# Patient Record
Sex: Male | Born: 1935
Health system: Southern US, Community
[De-identification: ages and names within clinical notes are randomized; demographics above are authoritative.]

## PROBLEM LIST (undated history)

## (undated) ENCOUNTER — Emergency Department (HOSPITAL_COMMUNITY): Payer: Medicare Other

## (undated) ENCOUNTER — Emergency Department (HOSPITAL_COMMUNITY): Discharge status: Absent without leave | Payer: Medicare Other

## (undated) DIAGNOSIS — E785 Hyperlipidemia, unspecified: Secondary | ICD-10-CM

## (undated) DIAGNOSIS — M1990 Unspecified osteoarthritis, unspecified site: Secondary | ICD-10-CM

## (undated) DIAGNOSIS — C4490 Unspecified malignant neoplasm of skin, unspecified: Secondary | ICD-10-CM

## (undated) DIAGNOSIS — I10 Essential (primary) hypertension: Secondary | ICD-10-CM

## (undated) HISTORY — DX: Hyperlipidemia, unspecified: E78.5

## (undated) HISTORY — DX: Essential (primary) hypertension: I10

## (undated) HISTORY — PX: SKIN LESION EXCISION: SHX2412

## (undated) HISTORY — PX: JOINT REPLACEMENT: SHX530

## (undated) HISTORY — PX: APPENDECTOMY: SHX54

---

## 2004-12-27 ENCOUNTER — Inpatient Hospital Stay (HOSPITAL_COMMUNITY): Admission: RE | Admit: 2004-12-27 | Discharge: 2005-01-01 | Payer: Self-pay | Admitting: Orthopedic Surgery

## 2004-12-27 ENCOUNTER — Ambulatory Visit: Payer: Self-pay | Admitting: Physical Medicine & Rehabilitation

## 2005-01-01 ENCOUNTER — Inpatient Hospital Stay
Admission: RE | Admit: 2005-01-01 | Discharge: 2005-01-05 | Payer: Self-pay | Admitting: Physical Medicine & Rehabilitation

## 2010-05-10 ENCOUNTER — Encounter: Payer: Self-pay | Admitting: Nurse Practitioner

## 2010-05-10 DIAGNOSIS — M1990 Unspecified osteoarthritis, unspecified site: Secondary | ICD-10-CM

## 2010-05-10 DIAGNOSIS — E559 Vitamin D deficiency, unspecified: Secondary | ICD-10-CM | POA: Insufficient documentation

## 2010-05-10 DIAGNOSIS — I10 Essential (primary) hypertension: Secondary | ICD-10-CM | POA: Insufficient documentation

## 2012-04-22 ENCOUNTER — Other Ambulatory Visit: Payer: Self-pay | Admitting: *Deleted

## 2012-04-22 DIAGNOSIS — I10 Essential (primary) hypertension: Secondary | ICD-10-CM

## 2012-04-22 MED ORDER — LISINOPRIL-HYDROCHLOROTHIAZIDE 20-12.5 MG PO TABS: 2.0000 | ORAL_TABLET | Freq: Every day | ORAL | Status: DC

## 2012-05-14 ENCOUNTER — Encounter: Payer: Self-pay | Admitting: Nurse Practitioner

## 2012-05-14 ENCOUNTER — Ambulatory Visit (INDEPENDENT_AMBULATORY_CARE_PROVIDER_SITE_OTHER): Payer: BC Managed Care – HMO | Admitting: Nurse Practitioner

## 2012-05-14 VITALS — BP 137/83 | HR 77 | Temp 97.0°F | Ht 74.0 in | Wt 266.0 lb

## 2012-05-14 DIAGNOSIS — I878 Other specified disorders of veins: Secondary | ICD-10-CM

## 2012-05-14 DIAGNOSIS — I10 Essential (primary) hypertension: Secondary | ICD-10-CM

## 2012-05-14 DIAGNOSIS — I872 Venous insufficiency (chronic) (peripheral): Secondary | ICD-10-CM

## 2012-05-14 DIAGNOSIS — L02419 Cutaneous abscess of limb, unspecified: Secondary | ICD-10-CM

## 2012-05-14 DIAGNOSIS — L03119 Cellulitis of unspecified part of limb: Secondary | ICD-10-CM

## 2012-05-14 MED ORDER — CEPHALEXIN 500 MG PO CAPS
500.0000 mg | ORAL_CAPSULE | Freq: Three times a day (TID) | ORAL | Status: DC
Start: 2012-05-14 — End: 2012-06-10

## 2012-05-14 MED ORDER — LISINOPRIL 40 MG PO TABS: 40.0000 mg | ORAL_TABLET | Freq: Every day | ORAL | Status: DC

## 2012-05-14 MED ORDER — FUROSEMIDE 20 MG PO TABS: 20.0000 mg | ORAL_TABLET | Freq: Every day | ORAL | Status: DC

## 2012-05-14 NOTE — Patient Instructions (Addendum)
Venous Stasis and Chronic Venous Insufficiency As people age, the veins located in their legs may weaken and stretch. When veins weaken and lose the ability to pump blood effectively, the condition is called chronic venous insufficiency (CVI) or venous stasis. Almost all veins return blood back to the heart. This happens by:  The force of the heart pumping fresh blood pushes blood back to the heart.  Blood flowing to the heart from the force of gravity. In the deep veins of the legs, blood has to fight gravity and flow upstream back to the heart. Here, the leg muscles contract to pump blood back toward the heart. Vein walls are elastic, and many veins have small valves that only allow blood to flow in one direction. When leg muscles contract, they push inward against the elastic vein walls. This squeezes blood upward, opens the valves, and moves blood toward the heart. When leg muscles relax, the vein wall also relaxes and the valves inside the vein close to prevent blood from flowing backward. This method of pumping blood out of the legs is called the venous pump. CAUSES  The venous pump works best while walking and leg muscles are contracting. But when a person sits or stands, blood pressure in leg veins can build. Deep veins are usually able to withstand short periods of inactivity, but long periods of inactivity (and increased pressure) can stretch, weaken, and damage vein walls. High blood pressure can also stretch and damage vein walls. The veins may no longer be able to pump blood back to the heart. Venous hypertension (high blood pressure inside veins) that lasts over time is a primary cause of CVI. CVI can also be caused by:   Deep vein thrombosis, a condition where a thrombus (blood clot) blocks blood flow in a vein.  Phlebitis, an inflammation of a superficial vein that causes a blood clot to form. Other risk factors for CVI may include:   Heredity.  Obesity.  Pregnancy.  Sedentary  lifestyle.  Smoking.  Jobs requiring long periods of standing or sitting in one place.  Age and gender:  Women in their 40's and 50's and men in their 70's are more prone to developing CVI. SYMPTOMS  Symptoms of CVI may include:   Varicose veins.  Ulceration or skin breakdown.  Lipodermatosclerosis, a condition that affects the skin just above the ankle, usually on the inside surface. Over time the skin becomes brown, smooth, tight and often painful. Those with this condition have a high risk of developing skin ulcers.  Reddened or discolored skin on the leg.  Swelling. DIAGNOSIS  Your caregiver can diagnose CVI after performing a careful medical history and physical examination. To confirm the diagnosis, the following tests may also be ordered:   Duplex ultrasound.  Plethysmography (tests blood flow).  Venograms (x-ray using a special dye). TREATMENT The goals of treatment for CVI are to restore a person to an active life and to minimize pain or disability. Typically, CVI does not pose a serious threat to life or limb, and with proper treatment most people with this condition can continue to lead active lives. In most cases, mild CVI can be treated on an outpatient basis with simple procedures. Treatment methods include:   Elastic compression socks.  Sclerotherapy, a procedure involving an injection of a material that "dissolves" the damaged veins. Other veins in the network of blood vessels take over the function of the damaged veins.  Vein stripping (an older procedure less commonly used).    Laser Ablation surgery.  Valve repair. HOME CARE INSTRUCTIONS   Elastic compression socks must be worn every day. They can help with symptoms and lower the chances of the problem getting worse, but they do not cure the problem.  Only take over-the-counter or prescription medicines for pain, discomfort, or fever as directed by your caregiver.  Your caregiver will review your other  medications with you. SEEK MEDICAL CARE IF:   You are confused about how to take your medications.  There is redness, swelling, or increasing pain in the affected area.  There is a red streak or line that extends up or down from the affected area.  There is a breakdown or loss of skin in the affected area, even if the breakdown is small.  You develop an unexplained oral temperature above 102 F (38.9 C).  There is an injury to the affected area. SEEK IMMEDIATE MEDICAL CARE IF:   There is an injury and open wound to the affected area.  Pain is not adequately relieved with pain medication prescribed or becomes severe.  An oral temperature above 102 F (38.9 C) develops.  The foot/ankle below the affected area becomes suddenly numb or the area feels weak and hard to move. MAKE SURE YOU:   Understand these instructions.  Will watch your condition.  Will get help right away if you are not doing well or get worse. Document Released: 05/28/2006 Document Revised: 04/16/2011 Document Reviewed: 08/05/2006 [employer] Patient Information 2013 [employer].  

## 2012-05-14 NOTE — Progress Notes (Signed)
  Subjective:    Patient ID: [name], male    DOB: [birth year], 79 y.o.   MRN: [id number]  HPI Patient in complaining of rash on bilateral lower extremtries . Started about 2  years. Has gotten better and worse since started. Pt states it flares up every summer with hot weather.  Associated symptoms include dry, cracked skin. He has tried Keflex last year with relief     Review of Systems  Constitutional: Negative.   HENT: Negative.   Cardiovascular: Negative.   Gastrointestinal: Negative.   Genitourinary: Negative.   Skin: Positive for rash (dry, red, and cracked).  Neurological: Negative.   Psychiatric/Behavioral: Negative.        Objective:   Physical Exam  Constitutional: He is oriented to person, place, and time. He appears well-developed.  HENT:  Head: Normocephalic.  Cardiovascular: Normal rate, regular rhythm and normal heart sounds.  Exam reveals no gallop.   No murmur heard. Pulmonary/Chest: Effort normal and breath sounds normal. No respiratory distress. He exhibits no tenderness.  Abdominal: Soft. Bowel sounds are normal. There is no tenderness. There is no rebound.  Musculoskeletal: Normal range of motion. He exhibits no edema.  Neurological: He is alert and oriented to person, place, and time.  Skin: Skin is dry. Rash (dry, red, cracked on lower extremites) noted.   BP 137/83  Pulse 77  Temp(Src) 97 F (36.1 C) (Oral)  Ht 6\' 2"  (1.88 m)  Wt 266 lb (120.657 kg)  BMI 34.14 kg/m2        Assessment & Plan:  Venous stasis with cellulitis bil Lower Ext  Elevate when sitting  STOP ZESTORETIC  Lisinopril 40 mg 1 QD  Lasix 20 mg 1 PO QD  Keflex as RX  RTO prn Mary-Margaret Daphine Deutscher, FNP

## 2012-06-10 ENCOUNTER — Ambulatory Visit (INDEPENDENT_AMBULATORY_CARE_PROVIDER_SITE_OTHER): Payer: BC Managed Care – HMO | Admitting: Nurse Practitioner

## 2012-06-10 ENCOUNTER — Encounter: Payer: Self-pay | Admitting: Nurse Practitioner

## 2012-06-10 VITALS — BP 169/82 | HR 54 | Temp 98.0°F | Ht 74.0 in | Wt 269.0 lb

## 2012-06-10 DIAGNOSIS — I10 Essential (primary) hypertension: Secondary | ICD-10-CM

## 2012-06-10 DIAGNOSIS — R600 Localized edema: Secondary | ICD-10-CM | POA: Insufficient documentation

## 2012-06-10 DIAGNOSIS — R609 Edema, unspecified: Secondary | ICD-10-CM

## 2012-06-10 LAB — COMPLETE METABOLIC PANEL WITH GFR
ALT: 14 U/L (ref 0–53)
AST: 17 U/L (ref 0–37)
Albumin: 4.1 g/dL (ref 3.5–5.2)
Alkaline Phosphatase: 50 U/L (ref 39–117)
BUN: 15 mg/dL (ref 6–23)
CO2: 29 mEq/L (ref 19–32)
Calcium: 9.5 mg/dL (ref 8.4–10.5)
Chloride: 102 mEq/L (ref 96–112)
Creat: 1.07 mg/dL (ref 0.50–1.35)
GFR, Est African American: 75 mL/min
GFR, Est Non African American: 65 mL/min
Glucose, Bld: 78 mg/dL (ref 70–99)
Potassium: 4.9 mEq/L (ref 3.5–5.3)
Sodium: 137 mEq/L (ref 135–145)
Total Bilirubin: 0.7 mg/dL (ref 0.3–1.2)
Total Protein: 6.2 g/dL (ref 6.0–8.3)

## 2012-06-10 MED ORDER — LISINOPRIL 40 MG PO TABS: 40.0000 mg | ORAL_TABLET | Freq: Every day | ORAL | Status: DC

## 2012-06-10 NOTE — Progress Notes (Signed)
  Subjective:    Patient ID: [name], male    DOB: Mar 01, 1929, 79 y.o.   MRN: [id number]  Hypertension This is a chronic problem. The current episode started more than 1 year ago. The problem has been waxing and waning since onset. The problem is uncontrolled. Associated symptoms include peripheral edema. Pertinent negatives include no blurred vision, chest pain, headaches, malaise/fatigue, palpitations, shortness of breath or sweats. There are no associated agents to hypertension. Risk factors for coronary artery disease include obesity and male gender. Past treatments include diuretics and ACE inhibitors. The current treatment provides mild improvement.  Peripheral edema Patient was recently put on lasix. Also on HCTZ with his lisinopril. Patient says swelling has gotten better since starting lasix    Review of Systems  Constitutional: Negative for malaise/fatigue.  Eyes: Negative for blurred vision.  Respiratory: Negative for shortness of breath.   Cardiovascular: Negative for chest pain and palpitations.  Neurological: Negative for headaches.  All other systems reviewed and are negative.       Objective:   Physical Exam  Constitutional: He is oriented to person, place, and time. He appears well-developed and well-nourished.  HENT:  Head: Normocephalic.  Right Ear: External ear normal.  Left Ear: External ear normal.  Nose: Nose normal.  Mouth/Throat: Oropharynx is clear and moist.  Eyes: EOM are normal. Pupils are equal, round, and reactive to light.  Neck: Normal range of motion. Neck supple. No thyromegaly present.  Cardiovascular: Normal rate, regular rhythm, normal heart sounds and intact distal pulses.   No murmur heard. Pulmonary/Chest: Effort normal and breath sounds normal. He has no wheezes. He has no rales.  Abdominal: Soft. Bowel sounds are normal.  Genitourinary: Prostate normal and penis normal.  Musculoskeletal: Normal range of motion.  Erythematous  lichenfication bil lower ext.  Neurological: He is alert and oriented to person, place, and time.  Skin: Skin is warm and dry.  Psychiatric: He has a normal mood and affect. His behavior is normal. Judgment and thought content normal.   BP 169/82  Pulse 54  Temp(Src) 98 F (36.7 C) (Oral)  Ht 6\' 2"  (1.88 m)  Wt 269 lb (122.018 kg)  BMI 34.52 kg/m2        Assessment & Plan:  1. Peripheral edema _Continue lasix as rx -elevate legs when sitting  2. Hypertension Low NA+ diet Stop zestoretic - lisinopril (PRINIVIL,ZESTRIL) 40 MG tablet; Take 1 tablet (40 mg total) by mouth daily.  Dispense: 90 tablet; Refill: 3  Orders Placed This Encounter  Procedures  . COMPLETE METABOLIC PANEL WITH GFR  . NMR Lipoprofile with Lipids  Mary-Margaret Daphine Deutscher, FNP

## 2012-06-10 NOTE — Patient Instructions (Signed)
Health Maintenance, Males A healthy lifestyle and preventative care can promote health and wellness.  Maintain regular health, dental, and eye exams.  Eat a healthy diet. Foods like vegetables, fruits, whole grains, low-fat dairy products, and lean protein foods contain the nutrients you need without too many calories. Decrease your intake of foods high in solid fats, added sugars, and salt. Get information about a proper diet from your caregiver, if necessary.  Regular physical exercise is one of the most important things you can do for your health. Most adults should get at least 150 minutes of moderate-intensity exercise (any activity that increases your heart rate and causes you to sweat) each week. In addition, most adults need muscle-strengthening exercises on 2 or more days a week.   Maintain a healthy weight. The body mass index (BMI) is a screening tool to identify possible weight problems. It provides an estimate of body fat based on height and weight. Your caregiver can help determine your BMI, and can help you achieve or maintain a healthy weight. For adults 20 years and older:  A BMI below 18.5 is considered underweight.  A BMI of 18.5 to 24.9 is normal.  A BMI of 25 to 29.9 is considered overweight.  A BMI of 30 and above is considered obese.  Maintain normal blood lipids and cholesterol by exercising and minimizing your intake of saturated fat. Eat a balanced diet with plenty of fruits and vegetables. Blood tests for lipids and cholesterol should begin at age 20 and be repeated every 5 years. If your lipid or cholesterol levels are high, you are over 50, or you are a high risk for heart disease, you may need your cholesterol levels checked more frequently.Ongoing high lipid and cholesterol levels should be treated with medicines, if diet and exercise are not effective.  If you smoke, find out from your caregiver how to quit. If you do not use tobacco, do not start.  If you  choose to drink alcohol, do not exceed 2 drinks per day. One drink is considered to be 12 ounces (355 mL) of beer, 5 ounces (148 mL) of wine, or 1.5 ounces (44 mL) of liquor.  Avoid use of street drugs. Do not share needles with anyone. Ask for help if you need support or instructions about stopping the use of drugs.  High blood pressure causes heart disease and increases the risk of stroke. Blood pressure should be checked at least every 1 to 2 years. Ongoing high blood pressure should be treated with medicines if weight loss and exercise are not effective.  If you are 45 to 77 years old, ask your caregiver if you should take aspirin to prevent heart disease.  Diabetes screening involves taking a blood sample to check your fasting blood sugar level. This should be done once every 3 years, after age 45, if you are within normal weight and without risk factors for diabetes. Testing should be considered at a younger age or be carried out more frequently if you are overweight and have at least 1 risk factor for diabetes.  Colorectal cancer can be detected and often prevented. Most routine colorectal cancer screening begins at the age of 50 and continues through age 75. However, your caregiver may recommend screening at an earlier age if you have risk factors for colon cancer. On a yearly basis, your caregiver may provide home test kits to check for hidden blood in the stool. Use of a small camera at the end of a tube,   to directly examine the colon (sigmoidoscopy or colonoscopy), can detect the earliest forms of colorectal cancer. Talk to your caregiver about this at age 50, when routine screening begins. Direct examination of the colon should be repeated every 5 to 10 years through age 75, unless early forms of pre-cancerous polyps or small growths are found.  Hepatitis C blood testing is recommended for all people born from 1945 through 1965 and any individual with known risks for hepatitis C.  Healthy  men should no longer receive prostate-specific antigen (PSA) blood tests as part of routine cancer screening. Consult with your caregiver about prostate cancer screening.  Testicular cancer screening is not recommended for adolescents or adult males who have no symptoms. Screening includes self-exam, caregiver exam, and other screening tests. Consult with your caregiver about any symptoms you have or any concerns you have about testicular cancer.  Practice safe sex. Use condoms and avoid high-risk sexual practices to reduce the spread of sexually transmitted infections (STIs).  Use sunscreen with a sun protection factor (SPF) of 30 or greater. Apply sunscreen liberally and repeatedly throughout the day. You should seek shade when your shadow is shorter than you. Protect yourself by wearing long sleeves, pants, a wide-brimmed hat, and sunglasses year round, whenever you are outdoors.  Notify your caregiver of new moles or changes in moles, especially if there is a change in shape or color. Also notify your caregiver if a mole is larger than the size of a pencil eraser.  A one-time screening for abdominal aortic aneurysm (AAA) and surgical repair of large AAAs by sound wave imaging (ultrasonography) is recommended for ages 65 to 75 years who are current or former smokers.  Stay current with your immunizations. Document Released: 07/21/2007 Document Revised: 04/16/2011 Document Reviewed: 06/19/2010 [employer] Patient Information 2013 [employer].  

## 2012-06-12 LAB — NMR LIPOPROFILE WITH LIPIDS
Cholesterol, Total: 171 mg/dL (ref <200)
HDL Particle Number: 31.3 umol/L (ref >=30.5)
HDL Size: 9 nm — ABNORMAL LOW (ref >=9.2)
HDL-C: 52 mg/dL (ref >=40)
LDL (calc): 99 mg/dL (ref <100)
LDL Particle Number: 1207 nmol/L — ABNORMAL HIGH (ref <1000)
LDL Size: 21.2 nm (ref >20.5)
LP-IR Score: 39 (ref <=45)
Large HDL-P: 6.3 umol/L (ref >=4.8)
Large VLDL-P: 2.5 nmol/L (ref <=2.7)
Small LDL Particle Number: 297 nmol/L (ref <=527)
Triglycerides: 102 mg/dL (ref <150)
VLDL Size: 44 nm (ref <=46.6)

## 2012-06-19 ENCOUNTER — Other Ambulatory Visit: Payer: Self-pay | Admitting: Nurse Practitioner

## 2012-06-20 NOTE — Telephone Encounter (Signed)
Saw where this was ordered on an April encounter?

## 2012-06-20 NOTE — Telephone Encounter (Signed)
NTBS for antibiotic refill 

## 2012-06-20 NOTE — Telephone Encounter (Signed)
Pt aware by VM to make an appt if an antibiotic is still needed

## 2012-08-13 ENCOUNTER — Ambulatory Visit (INDEPENDENT_AMBULATORY_CARE_PROVIDER_SITE_OTHER): Payer: BC Managed Care – HMO | Admitting: General Practice

## 2012-08-13 ENCOUNTER — Encounter: Payer: Self-pay | Admitting: General Practice

## 2012-08-13 VITALS — BP 169/81 | HR 74 | Temp 97.0°F | Ht 74.0 in | Wt 265.0 lb

## 2012-08-13 DIAGNOSIS — L0390 Cellulitis, unspecified: Secondary | ICD-10-CM

## 2012-08-13 DIAGNOSIS — L0291 Cutaneous abscess, unspecified: Secondary | ICD-10-CM

## 2012-08-13 MED ORDER — CEPHALEXIN 500 MG PO CAPS: 500.0000 mg | ORAL_CAPSULE | Freq: Three times a day (TID) | ORAL | Status: DC

## 2012-08-13 NOTE — Progress Notes (Signed)
  Subjective:    Patient ID: [name], male    DOB: 05-15-1929, 78 y.o.   MRN: [id number]  HPI Presents today with complaints of redness in left lower leg. He reports onset was two days ago. He denies OTC medications being used. He reports having recurrent episodes of cellulitis.     Review of Systems  Constitutional: Negative for fever and chills.  Respiratory: Negative for chest tightness and shortness of breath.   Cardiovascular: Negative for chest pain and palpitations.  Skin:       Redness to left lower leg with nickel size abrasion  Neurological: Negative for dizziness, weakness and numbness.       Objective:   Physical Exam  Constitutional: He is oriented to person, place, and time. He appears well-developed and well-nourished.  Cardiovascular: Normal rate, regular rhythm and normal heart sounds.   Pulmonary/Chest: Effort normal and breath sounds normal.  Neurological: He is alert and oriented to person, place, and time.  Skin: Skin is warm and dry. There is erythema.  Left lower leg cellulitis and nickel size abrasion noted to calf  Psychiatric: He has a normal mood and affect.          Assessment & Plan:  1. Cellulitis - cephALEXin (KEFLEX) 500 MG capsule; Take 1 capsule (500 mg total) by mouth 3 (three) times daily.  Dispense: 30 capsule; Refill: 0 -keep affected area clean and dry -RTO if symptoms worsen or unresolved -Patient verbalized understanding -Coralie Keens, FNP-C

## 2012-08-14 ENCOUNTER — Other Ambulatory Visit: Payer: Self-pay

## 2012-08-14 DIAGNOSIS — I10 Essential (primary) hypertension: Secondary | ICD-10-CM

## 2012-08-14 NOTE — Telephone Encounter (Signed)
On EPIC med list Lisinopril 40 mg  Pharmacy faxed over Lisinopril HCTZ  20/12.5

## 2012-08-15 MED ORDER — LISINOPRIL-HYDROCHLOROTHIAZIDE 20-12.5 MG PO TABS: 1.0000 | ORAL_TABLET | Freq: Every day | ORAL | Status: DC

## 2012-08-20 ENCOUNTER — Ambulatory Visit (INDEPENDENT_AMBULATORY_CARE_PROVIDER_SITE_OTHER): Payer: Medicare Other | Admitting: Family Medicine

## 2012-08-20 VITALS — BP 147/83 | HR 74 | Temp 98.3°F | Ht 75.0 in | Wt 263.0 lb

## 2012-08-20 DIAGNOSIS — L0291 Cutaneous abscess, unspecified: Secondary | ICD-10-CM

## 2012-08-20 DIAGNOSIS — L0390 Cellulitis, unspecified: Secondary | ICD-10-CM

## 2012-08-20 DIAGNOSIS — R609 Edema, unspecified: Secondary | ICD-10-CM

## 2012-08-20 MED ORDER — CEPHALEXIN 500 MG PO CAPS: 500.0000 mg | ORAL_CAPSULE | Freq: Three times a day (TID) | ORAL | Status: DC

## 2012-08-20 MED ORDER — FUROSEMIDE 40 MG PO TABS: ORAL_TABLET | ORAL | Status: DC

## 2012-08-20 MED ORDER — CLOTRIMAZOLE-BETAMETHASONE 1-0.05 % EX CREA: TOPICAL_CREAM | Freq: Two times a day (BID) | CUTANEOUS | Status: DC

## 2012-08-20 NOTE — Progress Notes (Signed)
  Subjective:    Patient ID: [name], male    DOB: 1929/05/28, 79 y.o.   MRN: [id number]  HPI This 79 y.o. male presents for evaluation of swelling in his left leg.  He was in a few weeks ago for Swelling and edema and was rx'd keflex which did help but it has returned.  He has been having A blister that formed and he has a dressing over it.  He states he injured his left leg years ago and always Gets swelling in the left leg.   Review of Systems C/o edema in his left leg. No chest pain, SOB, HA, dizziness, vision change, N/V, diarrhea, constipation, dysuria, urinary urgency or frequency, myalgias, arthralgias or rash.     Objective:   Physical Exam Vital signs noted  Well developed well nourished male.  HEENT - Head atraumatic Normocephalic                Throat - oropharanx wnl Respiratory - Lungs CTA bilateral Cardiac - RRR S1 and S2 without murmur Extremities - Left lower extremity with pre-tibial edema and erythema. Rash over left lower leg.      Assessment & Plan:   Cellulitis - Plan: cephALEXin (KEFLEX) 500 MG capsule, furosemide (LASIX) 40 MG tablet, clotrimazole-betamethasone (LOTRISONE) cream  Edema - Plan: furosemide (LASIX) 40 MG tablet

## 2012-08-21 NOTE — Patient Instructions (Signed)
Cellulitis Cellulitis is an infection of the skin and the tissue beneath it. The infected area is usually red and tender. Cellulitis occurs most often in the arms and lower legs.  CAUSES  Cellulitis is caused by bacteria that enter the skin through cracks or cuts in the skin. The most common types of bacteria that cause cellulitis are Staphylococcus and Streptococcus. SYMPTOMS   Redness and warmth.  Swelling.  Tenderness or pain.  Fever. DIAGNOSIS  Your caregiver can usually determine what is wrong based on a physical exam. Blood tests may also be done. TREATMENT  Treatment usually involves taking an antibiotic medicine. HOME CARE INSTRUCTIONS   Take your antibiotics as directed. Finish them even if you start to feel better.  Keep the infected arm or leg elevated to reduce swelling.  Apply a warm cloth to the affected area up to 4 times per day to relieve pain.  Only take over-the-counter or prescription medicines for pain, discomfort, or fever as directed by your caregiver.  Keep all follow-up appointments as directed by your caregiver. SEEK MEDICAL CARE IF:   You notice red streaks coming from the infected area.  Your red area gets larger or turns dark in color.  Your bone or joint underneath the infected area becomes painful after the skin has healed.  Your infection returns in the same area or another area.  You notice a swollen bump in the infected area.  You develop new symptoms. SEEK IMMEDIATE MEDICAL CARE IF:   You have a fever.  You feel very sleepy.  You develop vomiting or diarrhea.  You have a general ill feeling (malaise) with muscle aches and pains. MAKE SURE YOU:   Understand these instructions.  Will watch your condition.  Will get help right away if you are not doing well or get worse. Document Released: 11/01/2004 Document Revised: 07/24/2011 Document Reviewed: 04/09/2011 Exit[employer] Patient Information 2014 [employer].  

## 2012-09-08 ENCOUNTER — Other Ambulatory Visit: Payer: Self-pay | Admitting: Nurse Practitioner

## 2012-09-08 ENCOUNTER — Other Ambulatory Visit: Payer: Self-pay | Admitting: Family Medicine

## 2012-09-08 DIAGNOSIS — I10 Essential (primary) hypertension: Secondary | ICD-10-CM

## 2012-09-08 DIAGNOSIS — I878 Other specified disorders of veins: Secondary | ICD-10-CM

## 2012-09-08 MED ORDER — FUROSEMIDE 20 MG PO TABS: 20.0000 mg | ORAL_TABLET | Freq: Every day | ORAL | Status: DC

## 2012-09-08 NOTE — Telephone Encounter (Signed)
Lasix sent to pharmacy

## 2012-09-08 NOTE — Telephone Encounter (Signed)
Received refill request for 20mg . Recently was prescribed 40mg  to use in addition to other. Please review and clarify if you still want patient to take

## 2012-09-12 ENCOUNTER — Ambulatory Visit: Payer: BC Managed Care – HMO | Admitting: Nurse Practitioner

## 2012-09-24 ENCOUNTER — Other Ambulatory Visit: Payer: Self-pay | Admitting: Family Medicine

## 2012-09-30 ENCOUNTER — Other Ambulatory Visit: Payer: Self-pay | Admitting: Family Medicine

## 2012-10-01 NOTE — Telephone Encounter (Signed)
Last seen and filled 08/20/12  B Oxford

## 2013-01-02 ENCOUNTER — Ambulatory Visit (INDEPENDENT_AMBULATORY_CARE_PROVIDER_SITE_OTHER): Payer: Medicare Other

## 2013-01-02 DIAGNOSIS — Z23 Encounter for immunization: Secondary | ICD-10-CM

## 2013-06-06 ENCOUNTER — Other Ambulatory Visit: Payer: Self-pay | Admitting: Nurse Practitioner

## 2013-06-06 ENCOUNTER — Other Ambulatory Visit: Payer: Self-pay | Admitting: Family Medicine

## 2013-06-08 NOTE — Telephone Encounter (Signed)
Patient NTBS for follow up and lab work  

## 2013-06-08 NOTE — Telephone Encounter (Signed)
No labs since 06/2012

## 2013-08-25 ENCOUNTER — Telehealth: Payer: Self-pay | Admitting: Family Medicine

## 2013-08-25 MED ORDER — CLOTRIMAZOLE-BETAMETHASONE 1-0.05 % EX CREA: TOPICAL_CREAM | CUTANEOUS | Status: DC

## 2013-08-25 NOTE — Telephone Encounter (Signed)
You may refill both of these medications

## 2013-08-25 NOTE — Telephone Encounter (Signed)
Left message to call back to schedule. According to records patient should have ran out of medicine months ago.

## 2013-08-27 NOTE — Telephone Encounter (Signed)
RX done 08-25-13

## 2013-08-27 NOTE — Telephone Encounter (Signed)
Patient has appt on 7/24

## 2013-08-28 ENCOUNTER — Ambulatory Visit (INDEPENDENT_AMBULATORY_CARE_PROVIDER_SITE_OTHER): Payer: Medicare Other | Admitting: Family Medicine

## 2013-08-28 ENCOUNTER — Encounter: Payer: Self-pay | Admitting: Family Medicine

## 2013-08-28 VITALS — BP 156/79 | HR 54 | Temp 97.2°F | Ht 74.0 in | Wt 263.0 lb

## 2013-08-28 DIAGNOSIS — I10 Essential (primary) hypertension: Secondary | ICD-10-CM

## 2013-08-28 DIAGNOSIS — E785 Hyperlipidemia, unspecified: Secondary | ICD-10-CM

## 2013-08-28 DIAGNOSIS — I872 Venous insufficiency (chronic) (peripheral): Secondary | ICD-10-CM

## 2013-08-28 DIAGNOSIS — I878 Other specified disorders of veins: Secondary | ICD-10-CM

## 2013-08-28 LAB — POCT CBC
Granulocyte percent: 61.4 %G (ref 37–80)
HCT, POC: 46.8 % (ref 43.5–53.7)
Hemoglobin: 15.3 g/dL (ref 14.1–18.1)
Lymph, poc: 2.2 (ref 0.6–3.4)
MCH, POC: 30.2 pg (ref 27–31.2)
MCHC: 32.7 g/dL (ref 31.8–35.4)
MCV: 92.4 fL (ref 80–97)
MPV: 7.7 fL (ref 0–99.8)
POC Granulocyte: 4.1 (ref 2–6.9)
POC LYMPH PERCENT: 32.7 %L (ref 10–50)
Platelet Count, POC: 232 10*3/uL (ref 142–424)
RBC: 5.1 M/uL (ref 4.69–6.13)
RDW, POC: 13 %
WBC: 6.7 10*3/uL (ref 4.6–10.2)

## 2013-08-28 MED ORDER — LISINOPRIL 40 MG PO TABS: 40.0000 mg | ORAL_TABLET | Freq: Every day | ORAL | Status: DC

## 2013-08-28 MED ORDER — CLOTRIMAZOLE-BETAMETHASONE 1-0.05 % EX CREA: TOPICAL_CREAM | CUTANEOUS | Status: DC

## 2013-08-28 MED ORDER — FUROSEMIDE 20 MG PO TABS: 20.0000 mg | ORAL_TABLET | Freq: Every day | ORAL | Status: DC

## 2013-08-28 NOTE — Progress Notes (Signed)
   Subjective:    Patient ID: [name], male    DOB: 12/07/1929, 80 y.o.   MRN: [id number]  HPI  This 80 y.o. male presents for evaluation of routine follow up.  He has hx of peripheral edema, htn, And arthritis.  Review of Systems    No chest pain, SOB, HA, dizziness, vision change, N/V, diarrhea, constipation, dysuria, urinary urgency or frequency, myalgias, arthralgias or rash.  Objective:   Physical Exam Vital signs noted  Elderly male in NAD.  HEENT - Head atraumatic Normocephalic                Eyes - PERRLA, Conjuctiva - clear Sclera- Clear EOMI                Ears - EAC's Wnl TM's Wnl Gross Hearing WNL                Nose - Nares patent                 Throat - oropharanx wnl Respiratory - Lungs CTA bilateral Cardiac - RRR S1 and S2 without murmur GI - Abdomen soft Nontender and bowel sounds active x 4 Extremities - Venous stasis dermatitis and one plus pretibial edema Neuro - Grossly intact.       Assessment & Plan:  Essential hypertension - Plan: POCT CBC, CMP14+EGFR, furosemide (LASIX) 20 MG tablet, lisinopril (PRINIVIL,ZESTRIL) 40 MG tablet  Hyperlipemia - Plan: Lipid panel, Hepatic function panel  Venous stasis of lower extremity - Plan: clotrimazole-betamethasone (LOTRISONE) cream, furosemide (LASIX) 20 MG tablet  William J Oxford FNP 

## 2013-08-29 LAB — CMP14+EGFR
ALT: 13 IU/L (ref 0–44)
AST: 22 IU/L (ref 0–40)
Albumin/Globulin Ratio: 1.9 (ref 1.1–2.5)
Albumin: 4.1 g/dL (ref 3.5–4.7)
Alkaline Phosphatase: 61 IU/L (ref 39–117)
BUN/Creatinine Ratio: 18 (ref 10–22)
BUN: 17 mg/dL (ref 8–27)
CO2: 25 mmol/L (ref 18–29)
Calcium: 9.5 mg/dL (ref 8.6–10.2)
Chloride: 98 mmol/L (ref 97–108)
Creatinine, Ser: 0.93 mg/dL (ref 0.76–1.27)
GFR calc Af Amer: 88 mL/min/{1.73_m2} (ref >59)
GFR calc non Af Amer: 76 mL/min/{1.73_m2} (ref >59)
Globulin, Total: 2.2 g/dL (ref 1.5–4.5)
Glucose: 81 mg/dL (ref 65–99)
Potassium: 4.7 mmol/L (ref 3.5–5.2)
Sodium: 137 mmol/L (ref 134–144)
Total Bilirubin: 0.9 mg/dL (ref 0.0–1.2)
Total Protein: 6.3 g/dL (ref 6.0–8.5)

## 2013-08-29 LAB — LIPID PANEL
Chol/HDL Ratio: 3.1 ratio units (ref 0.0–5.0)
Cholesterol, Total: 169 mg/dL (ref 100–199)
HDL: 55 mg/dL (ref >39)
LDL Calculated: 99 mg/dL (ref 0–99)
Triglycerides: 74 mg/dL (ref 0–149)
VLDL Cholesterol Cal: 15 mg/dL (ref 5–40)

## 2013-08-29 LAB — HEPATIC FUNCTION PANEL: Bilirubin, Direct: 0.23 mg/dL (ref 0.00–0.40)

## 2013-10-17 ENCOUNTER — Telehealth: Payer: Self-pay | Admitting: Family Medicine

## 2013-10-17 ENCOUNTER — Other Ambulatory Visit: Payer: Self-pay | Admitting: Nurse Practitioner

## 2013-10-17 MED ORDER — CEPHALEXIN 500 MG PO CAPS: 500.0000 mg | ORAL_CAPSULE | Freq: Three times a day (TID) | ORAL | Status: DC

## 2013-10-19 ENCOUNTER — Other Ambulatory Visit: Payer: Self-pay | Admitting: Family Medicine

## 2013-10-19 MED ORDER — CEPHALEXIN 500 MG PO CAPS: 500.0000 mg | ORAL_CAPSULE | Freq: Three times a day (TID) | ORAL | Status: DC

## 2013-10-19 NOTE — Telephone Encounter (Signed)
Keflex sent to pharm and follow up if not better.

## 2013-10-19 NOTE — Telephone Encounter (Signed)
Left message that med was sent to pharmacy and to schedule an appt if it didn't resolve.

## 2014-01-06 ENCOUNTER — Ambulatory Visit: Payer: Medicare Other | Admitting: *Deleted

## 2014-01-06 DIAGNOSIS — Z23 Encounter for immunization: Secondary | ICD-10-CM

## 2014-02-01 ENCOUNTER — Ambulatory Visit: Payer: Medicare Other | Admitting: *Deleted

## 2014-02-01 DIAGNOSIS — Z23 Encounter for immunization: Secondary | ICD-10-CM

## 2014-05-08 ENCOUNTER — Other Ambulatory Visit: Payer: Medicare Other

## 2014-05-08 ENCOUNTER — Ambulatory Visit (INDEPENDENT_AMBULATORY_CARE_PROVIDER_SITE_OTHER): Payer: Medicare Other | Admitting: Family

## 2014-05-08 VITALS — BP 159/83 | HR 63 | Temp 97.0°F | Ht 74.0 in | Wt 269.2 lb

## 2014-05-08 DIAGNOSIS — M1990 Unspecified osteoarthritis, unspecified site: Secondary | ICD-10-CM

## 2014-05-08 DIAGNOSIS — E559 Vitamin D deficiency, unspecified: Secondary | ICD-10-CM

## 2014-05-08 DIAGNOSIS — R609 Edema, unspecified: Secondary | ICD-10-CM | POA: Diagnosis not present

## 2014-05-08 DIAGNOSIS — I10 Essential (primary) hypertension: Secondary | ICD-10-CM | POA: Diagnosis not present

## 2014-05-08 DIAGNOSIS — I878 Other specified disorders of veins: Secondary | ICD-10-CM

## 2014-05-08 MED ORDER — CEPHALEXIN 500 MG PO CAPS: 500.0000 mg | ORAL_CAPSULE | Freq: Four times a day (QID) | ORAL | Status: DC

## 2014-05-08 MED ORDER — FUROSEMIDE 20 MG PO TABS: 20.0000 mg | ORAL_TABLET | Freq: Two times a day (BID) | ORAL | Status: DC

## 2014-05-08 NOTE — Patient Instructions (Signed)
Edema Edema is an abnormal buildup of fluids in your bodytissues. Edema is somewhatdependent on gravity to pull the fluid to the lowest place in your body. That makes the condition more common in the legs and thighs (lower extremities). Painless swelling of the feet and ankles is common and becomes more likely as you get older. It is also common in looser tissues, like around your eyes.  When the affected area is squeezed, the fluid may move out of that spot and leave a dent for a few moments. This dent is called pitting.  CAUSES  There are many possible causes of edema. Eating too much salt and being on your feet or sitting for a long time can cause edema in your legs and ankles. Hot weather may make edema worse. Common medical causes of edema include:  Heart failure.  Liver disease.  Kidney disease.  Weak blood vessels in your legs.  Cancer.  An injury.  Pregnancy.  Some medications.  Obesity. SYMPTOMS  Edema is usually painless.Your skin may look swollen or shiny.  DIAGNOSIS  Your health care provider may be able to diagnose edema by asking about your medical history and doing a physical exam. You may need to have tests such as X-rays, an electrocardiogram, or blood tests to check for medical conditions that may cause edema.  TREATMENT  Edema treatment depends on the cause. If you have heart, liver, or kidney disease, you need the treatment appropriate for these conditions. General treatment may include:  Elevation of the affected body part above the level of your heart.  Compression of the affected body part. Pressure from elastic bandages or support stockings squeezes the tissues and forces fluid back into the blood vessels. This keeps fluid from entering the tissues.  Restriction of fluid and salt intake.  Use of a water pill (diuretic). These medications are appropriate only for some types of edema. They pull fluid out of your body and make you urinate more often. This  gets rid of fluid and reduces swelling, but diuretics can have side effects. Only use diuretics as directed by your health care provider. HOME CARE INSTRUCTIONS   Keep the affected body part above the level of your heart when you are lying down.   Do not sit still or stand for prolonged periods.   Do not put anything directly under your knees when lying down.  Do not wear constricting clothing or garters on your upper legs.   Exercise your legs to work the fluid back into your blood vessels. This may help the swelling go down.   Wear elastic bandages or support stockings to reduce ankle swelling as directed by your health care provider.   Eat a low-salt diet to reduce fluid if your health care provider recommends it.   Only take medicines as directed by your health care provider. SEEK MEDICAL CARE IF:   Your edema is not responding to treatment.  You have heart, liver, or kidney disease and notice symptoms of edema.  You have edema in your legs that does not improve after elevating them.   You have sudden and unexplained weight gain. SEEK IMMEDIATE MEDICAL CARE IF:   You develop shortness of breath or chest pain.   You cannot breathe when you lie down.  You develop pain, redness, or warmth in the swollen areas.   You have heart, liver, or kidney disease and suddenly get edema.  You have a fever and your symptoms suddenly get worse. MAKE SURE YOU:     Understand these instructions.  Will watch your condition.  Will get help right away if you are not doing well or get worse. Document Released: 01/22/2005 Document Revised: 06/08/2013 Document Reviewed: 11/14/2012 [facility] Patient Information 2015 [place], [place]. This information is not intended to replace advice given to you by your health care provider. Make sure you discuss any questions you have with your health care provider. Cellulitis Cellulitis is an infection of the skin and the tissue beneath it. The  infected area is usually red and tender. Cellulitis occurs most often in the arms and lower legs.  CAUSES  Cellulitis is caused by bacteria that enter the skin through cracks or cuts in the skin. The most common types of bacteria that cause cellulitis are staphylococci and streptococci. SIGNS AND SYMPTOMS   Redness and warmth.  Swelling.  Tenderness or pain.  Fever. DIAGNOSIS  Your health care provider can usually determine what is wrong based on a physical exam. Blood tests may also be done. TREATMENT  Treatment usually involves taking an antibiotic medicine. HOME CARE INSTRUCTIONS   Take your antibiotic medicine as directed by your health care provider. Finish the antibiotic even if you start to feel better.  Keep the infected arm or leg elevated to reduce swelling.  Apply a warm cloth to the affected area up to 4 times per day to relieve pain.  Take medicines only as directed by your health care provider.  Keep all follow-up visits as directed by your health care provider. SEEK MEDICAL CARE IF:   You notice red streaks coming from the infected area.  Your red area gets larger or turns dark in color.  Your bone or joint underneath the infected area becomes painful after the skin has healed.  Your infection returns in the same area or another area.  You notice a swollen bump in the infected area.  You develop new symptoms.  You have a fever. SEEK IMMEDIATE MEDICAL CARE IF:   You feel very sleepy.  You develop vomiting or diarrhea.  You have a general ill feeling (malaise) with muscle aches and pains. MAKE SURE YOU:   Understand these instructions.  Will watch your condition.  Will get help right away if you are not doing well or get worse. Document Released: 11/01/2004 Document Revised: 06/08/2013 Document Reviewed: 04/09/2011 [facility] Patient Information 2015 [place], [place]. This information is not intended to replace advice given to you by your health care  provider. Make sure you discuss any questions you have with your health care provider.

## 2014-05-08 NOTE — Progress Notes (Signed)
Subjective:    Patient ID: [name], male    DOB: [birth year], 81 y.o.   MRN: [id number]  Hypertension This is a chronic problem. The current episode started more than 1 year ago. The problem has been waxing and waning since onset. The problem is uncontrolled. Associated symptoms include peripheral edema. Pertinent negatives include no anxiety, headaches, palpitations or shortness of breath. Risk factors for coronary artery disease include male gender and obesity. Past treatments include ACE inhibitors and diuretics. The current treatment provides mild improvement. There is no history of kidney disease, CAD/MI, CVA, heart failure or a thyroid problem. There is no history of sleep apnea.   Pt is also complaining of left leg redness. Pt states he gets "he gets cellulitis  every time it gets hot" and usually gets a pill to help with infection.   Review of Systems  Constitutional: Negative.   HENT: Negative.   Respiratory: Negative.  Negative for shortness of breath.   Cardiovascular: Positive for leg swelling. Negative for palpitations.  Gastrointestinal: Negative.   Endocrine: Negative.   Genitourinary: Negative.   Musculoskeletal: Negative.   Neurological: Negative.  Negative for headaches.  Hematological: Negative.   Psychiatric/Behavioral: Negative.   All other systems reviewed and are negative.      Objective:   Physical Exam  Constitutional: He is oriented to person, place, and time. He appears well-developed and well-nourished. No distress.  HENT:  Head: Normocephalic.  Right Ear: External ear normal.  Left Ear: External ear normal.  Nose: Nose normal.  Mouth/Throat: Oropharynx is clear and moist.  Eyes: Pupils are equal, round, and reactive to light. Right eye exhibits no discharge. Left eye exhibits no discharge.  Neck: Normal range of motion. Neck supple. No thyromegaly present.  Cardiovascular: Normal rate, regular rhythm, normal heart sounds and intact distal  pulses.   No murmur heard. Pulmonary/Chest: Effort normal and breath sounds normal. No respiratory distress. He has no wheezes.  Abdominal: Soft. Bowel sounds are normal. He exhibits no distension. There is no tenderness.  Musculoskeletal: Normal range of motion. He exhibits edema (3+ in BLE). He exhibits no tenderness.  Neurological: He is alert and oriented to person, place, and time. He has normal reflexes. No cranial nerve deficit.  Skin: Skin is warm and dry. No rash noted. There is erythema.  Bilateral BLE are erythemas. Left leg > right leg. Left leg warm with 3+ swelling with the redness going up to the middle shin.   Psychiatric: He has a normal mood and affect. His behavior is normal. Judgment and thought content normal.  Vitals reviewed.     BP 159/83 mmHg  Pulse 63  Temp(Src) 97 F (36.1 C) (Oral)  Ht 6' 2"  (1.88 m)  Wt 269 lb 3.2 oz (122.108 kg)  BMI 34.55 kg/m2     Assessment & Plan:  1. Essential hypertension -Lasix increased to BID  CMP14+EGFR - furosemide (LASIX) 20 MG tablet; Take 1 tablet (20 mg total) by mouth 2 (two) times daily.  Dispense: 180 tablet; Refill: 3  2. Arthritis - CMP14+EGFR  3. Peripheral edema - CMP14+EGFR - Brain natriuretic peptide - Thyroid Panel With TSH - cephALEXin (KEFLEX) 500 MG capsule; Take 1 capsule (500 mg total) by mouth 4 (four) times daily.  Dispense: 40 capsule; Refill: 0  4. Vitamin D deficiency - CMP14+EGFR - Vit D  25 hydroxy (rtn osteoporosis monitoring)  5. Venous stasis of lower extremity - CMP14+EGFR - furosemide (LASIX) 20 MG tablet; Take 1 tablet (20  mg total) by mouth 2 (two) times daily.  Dispense: 180 tablet; Refill: 3 - cephALEXin (KEFLEX) 500 MG capsule; Take 1 capsule (500 mg total) by mouth 4 (four) times daily.  Dispense: 40 capsule; Refill: 0   Continue all meds Labs pending Health Maintenance reviewed Diet and exercise encouraged RTO 2 weeks to recheck HTN, Venous stasis  Evelina Dun,  FNP

## 2014-05-10 ENCOUNTER — Other Ambulatory Visit: Payer: Self-pay | Admitting: Family

## 2014-05-10 LAB — CMP14+EGFR
ALT: 13 IU/L (ref 0–44)
AST: 16 IU/L (ref 0–40)
Albumin/Globulin Ratio: 2.1 (ref 1.1–2.5)
Albumin: 4.1 g/dL (ref 3.5–4.7)
Alkaline Phosphatase: 66 IU/L (ref 39–117)
BUN/Creatinine Ratio: 17 (ref 10–22)
BUN: 17 mg/dL (ref 8–27)
Bilirubin Total: 0.4 mg/dL (ref 0.0–1.2)
CO2: 24 mmol/L (ref 18–29)
Calcium: 9 mg/dL (ref 8.6–10.2)
Chloride: 97 mmol/L (ref 97–108)
Creatinine, Ser: 0.99 mg/dL (ref 0.76–1.27)
GFR calc Af Amer: 82 mL/min/{1.73_m2} (ref >59)
GFR calc non Af Amer: 71 mL/min/{1.73_m2} (ref >59)
Globulin, Total: 2 g/dL (ref 1.5–4.5)
Glucose: 58 mg/dL — ABNORMAL LOW (ref 65–99)
Potassium: 4.7 mmol/L (ref 3.5–5.2)
Sodium: 139 mmol/L (ref 134–144)
Total Protein: 6.1 g/dL (ref 6.0–8.5)

## 2014-05-10 LAB — THYROID PANEL WITH TSH
Free Thyroxine Index: 2.5 (ref 1.2–4.9)
T3 Uptake Ratio: 29 % (ref 24–39)
T4, Total: 8.6 ug/dL (ref 4.5–12.0)
TSH: 3.62 u[IU]/mL (ref 0.450–4.500)

## 2014-05-10 LAB — BRAIN NATRIURETIC PEPTIDE: BNP: 83.4 pg/mL (ref 0.0–100.0)

## 2014-05-10 LAB — VITAMIN D 25 HYDROXY (VIT D DEFICIENCY, FRACTURES): Vit D, 25-Hydroxy: 19.7 ng/mL — ABNORMAL LOW (ref 30.0–100.0)

## 2014-05-10 MED ORDER — VITAMIN D (ERGOCALCIFEROL) 1.25 MG (50000 UNIT) PO CAPS: 50000.0000 [IU] | ORAL_CAPSULE | ORAL | Status: DC

## 2014-06-05 ENCOUNTER — Other Ambulatory Visit: Payer: Self-pay | Admitting: Family

## 2014-06-07 ENCOUNTER — Other Ambulatory Visit: Payer: Self-pay | Admitting: Family Medicine

## 2014-06-08 NOTE — Telephone Encounter (Signed)
Last seen 05/08/14 [name]

## 2014-06-10 ENCOUNTER — Other Ambulatory Visit: Payer: Self-pay | Admitting: Nurse Practitioner

## 2014-06-10 NOTE — Telephone Encounter (Signed)
Instructed pt that it would be best that he be seen again for this. The antibiotic may need to be changed to something else. State that he was instructed to call back at 8am to check on an appt time

## 2014-06-12 ENCOUNTER — Ambulatory Visit (INDEPENDENT_AMBULATORY_CARE_PROVIDER_SITE_OTHER): Payer: Medicare Other | Admitting: Family Medicine

## 2014-06-12 VITALS — BP 169/84 | HR 63 | Temp 97.0°F | Ht 74.0 in | Wt 267.0 lb

## 2014-06-12 DIAGNOSIS — I878 Other specified disorders of veins: Secondary | ICD-10-CM | POA: Diagnosis not present

## 2014-06-12 DIAGNOSIS — R609 Edema, unspecified: Secondary | ICD-10-CM | POA: Diagnosis not present

## 2014-06-12 DIAGNOSIS — I499 Cardiac arrhythmia, unspecified: Secondary | ICD-10-CM

## 2014-06-12 MED ORDER — CEPHALEXIN 500 MG PO CAPS: 500.0000 mg | ORAL_CAPSULE | Freq: Three times a day (TID) | ORAL | Status: DC

## 2014-06-12 NOTE — Progress Notes (Signed)
Subjective:    Patient ID: [name], male    DOB: 1929/08/06, 81 y.o.   MRN: [id number]  HPI Patient here today for bilateral lower extremity redness and swelling. The patient has recently been seen and treated for the same problem. He says it is always worse when the weather is warmer. He says he watches his sodium intake. Previously he took a round of antibiotic which she finished not too long ago and extra fluid pill. This seemed to help. He denies chest pain shortness of breath or any other complaints. He is active physically.      Patient Active Problem List   Diagnosis Date Noted  . Peripheral edema 06/10/2012  . Hypertension 05/10/2010  . Vitamin D deficiency 05/10/2010  . Arthritis 05/10/2010   Outpatient Encounter Prescriptions as of 06/12/2014  Medication Sig  . clotrimazole-betamethasone (LOTRISONE) cream APPLY TO AFFECTED AREA TWICE DAILY  . fish oil-omega-3 fatty acids 1000 MG capsule Take 2 g by mouth daily.    . fluticasone (FLONASE) 50 MCG/ACT nasal spray   . furosemide (LASIX) 20 MG tablet Take 1 tablet (20 mg total) by mouth 2 (two) times daily. (Patient taking differently: Take 20 mg by mouth daily. )  . lisinopril (PRINIVIL,ZESTRIL) 40 MG tablet Take 1 tablet (40 mg total) by mouth daily.  . Vitamin D, Ergocalciferol, (DRISDOL) 50000 UNITS CAPS capsule Take 1 capsule (50,000 Units total) by mouth every 7 (seven) days.  . [DISCONTINUED] cephALEXin (KEFLEX) 500 MG capsule Take 1 capsule (500 mg total) by mouth 4 (four) times daily.   No facility-administered encounter medications on file as of 06/12/2014.      Review of Systems  Constitutional: Negative.   HENT: Negative.   Eyes: Negative.   Respiratory: Negative.   Cardiovascular: Negative.   Gastrointestinal: Negative.   Endocrine: Negative.   Genitourinary: Negative.   Musculoskeletal: Negative.   Skin: Negative.        Red and swelling - lower extremity.   Allergic/Immunologic: Negative.     Neurological: Negative.   Hematological: Negative.   Psychiatric/Behavioral: Negative.        Objective:   Physical Exam  Constitutional: He is oriented to person, place, and time. He appears well-developed and well-nourished. No distress.  HENT:  Head: Normocephalic and atraumatic.  Nose: Nose normal.  Eyes: Conjunctivae and EOM are normal. Pupils are equal, round, and reactive to light. Right eye exhibits no discharge. Left eye exhibits no discharge. No scleral icterus.  Neck: Normal range of motion. Neck supple. No thyromegaly present.  Cardiovascular: Normal rate and normal heart sounds.   No murmur heard. The heart was slightly irregular at 60/m with what sounds to be like an occasional PVC. Pulses in the lower extremity were difficult to palpate especially on the right side. A posterior tibial was palpable on the left. There was some pedal and pretibial edema bilaterally left being greater than right.  Pulmonary/Chest: Effort normal and breath sounds normal. No respiratory distress. He has no wheezes. He has no rales. He exhibits no tenderness.  Abdominal: Soft. Bowel sounds are normal. He exhibits no mass. There is no tenderness. There is no rebound and no guarding.  Obese without masses or tenderness and no inguinal adenopathy  Musculoskeletal: Normal range of motion. He exhibits edema. He exhibits no tenderness.  Lymphadenopathy:    He has no cervical adenopathy.  Neurological: He is alert and oriented to person, place, and time.  Skin: Skin is warm and dry. No rash noted.  No erythema. No pallor.  There was erythema without rubor and with minimal edema of both anterior lower legs  Psychiatric: He has a normal mood and affect. His behavior is normal. Judgment and thought content normal.  Nursing note and vitals reviewed.  BP 169/84 mmHg  Pulse 63  Temp(Src) 97 F (36.1 C) (Oral)  Ht 6\' 2"  (1.88 m)  Wt 267 lb (121.11 kg)  BMI 34.27 kg/m2  Repeat blood pressure was  146/82 with a large cuff in the left arm.  EKG: --- The patient has PACs and he will be given a copy of this with a first-degree AV block    Assessment & Plan:  1. Irregular heart beat -The patient should watch his caffeine intake is much as possible - EKG 12-Lead  2. Edema -He should limit his sodium as much as possible and purchase support stockings that he will put on the first thing when he gets up in the morning  3. Venous stasis of lower extremity -He will take the extra fluid pill for a couple of weeks and will take the antibiotic as directed until he is rechecked  Meds ordered this encounter  Medications  . cephALEXin (KEFLEX) 500 MG capsule    Sig: Take 1 capsule (500 mg total) by mouth 3 (three) times daily.    Dispense:  90 capsule    Refill:  0   Patient Instructions  Avoid caffeine as much as possible Decrease sodium in the diet Get the support hose and try wearing them and [employer] put them in when you get out of the bed the first thing in the morning Take the antibiotic as directed Take the extra fluid pill at 4:00 in the afternoon for the next couple of weeks and then go back to one daily. In the future we may need to get venous and arterial Dopplers to further evaluate the circulation in your lower extremities   Arrie Senate MD

## 2014-06-12 NOTE — Patient Instructions (Signed)
Avoid caffeine as much as possible Decrease sodium in the diet Get the support hose and try wearing them and [employer] put them in when you get out of the bed the first thing in the morning Take the antibiotic as directed Take the extra fluid pill at 4:00 in the afternoon for the next couple of weeks and then go back to one daily. In the future we may need to get venous and arterial Dopplers to further evaluate the circulation in your lower extremities

## 2014-06-28 ENCOUNTER — Ambulatory Visit (INDEPENDENT_AMBULATORY_CARE_PROVIDER_SITE_OTHER): Payer: Medicare Other | Admitting: Family Medicine

## 2014-06-28 ENCOUNTER — Encounter: Payer: Self-pay | Admitting: Family Medicine

## 2014-06-28 VITALS — BP 164/88 | HR 83 | Temp 96.8°F | Ht 74.0 in | Wt 263.0 lb

## 2014-06-28 DIAGNOSIS — R609 Edema, unspecified: Secondary | ICD-10-CM

## 2014-06-28 DIAGNOSIS — L03115 Cellulitis of right lower limb: Secondary | ICD-10-CM | POA: Diagnosis not present

## 2014-06-28 DIAGNOSIS — I10 Essential (primary) hypertension: Secondary | ICD-10-CM

## 2014-06-28 DIAGNOSIS — I878 Other specified disorders of veins: Secondary | ICD-10-CM | POA: Diagnosis not present

## 2014-06-28 DIAGNOSIS — L03116 Cellulitis of left lower limb: Secondary | ICD-10-CM

## 2014-06-28 NOTE — Patient Instructions (Signed)
Continue to take Lasix twice daily We will get a BMP on you today and make sure your electrolytes and kidney function are stable with the Lasix twice a day Continue antibiotic Wear support hose and put them on the very first thing when you get out of bed in the morning Watch sodium intake Elevate feet as much as possible when sitting during the day

## 2014-06-28 NOTE — Progress Notes (Signed)
Subjective:    Patient ID: Opie Maclaughlin, male    DOB: [birth year], 81 y.o.   MRN: [id number]  HPI Patient here today for 2 week follow up on cellulitis of lower extremities. The patient still has some redness and cellulitis and it may be some better. He wears his compression hose at bedtime. EKG was done at the last visit and his heart irregularity is primarily premature atrial contractions. His weight is down 3 pounds since the last visit. His blood pressure however remains elevated at 164/88. At his last visit the blood pressure was 169/84.       Patient Active Problem List   Diagnosis Date Noted  . Peripheral edema 06/10/2012  . Hypertension 05/10/2010  . Vitamin D deficiency 05/10/2010  . Arthritis 05/10/2010   Outpatient Encounter Prescriptions as of 06/28/2014  Medication Sig  . cephALEXin (KEFLEX) 500 MG capsule Take 1 capsule (500 mg total) by mouth 3 (three) times daily.  . clotrimazole-betamethasone (LOTRISONE) cream APPLY TO AFFECTED AREA TWICE DAILY  . fish oil-omega-3 fatty acids 1000 MG capsule Take 2 g by mouth daily.    . fluticasone (FLONASE) 50 MCG/ACT nasal spray   . furosemide (LASIX) 20 MG tablet Take 1 tablet (20 mg total) by mouth 2 (two) times daily. (Patient taking differently: Take 20 mg by mouth daily. )  . lisinopril (PRINIVIL,ZESTRIL) 40 MG tablet Take 1 tablet (40 mg total) by mouth daily.  . Vitamin D, Ergocalciferol, (DRISDOL) 50000 UNITS CAPS capsule Take 1 capsule (50,000 Units total) by mouth every 7 (seven) days.   No facility-administered encounter medications on file as of 06/28/2014.     Review of Systems  Constitutional: Negative.   HENT: Negative.   Eyes: Negative.   Respiratory: Negative.   Cardiovascular: Negative.   Gastrointestinal: Negative.   Endocrine: Negative.   Genitourinary: Negative.   Musculoskeletal: Negative.   Skin: Positive for color change (redness).  Allergic/Immunologic: Negative.   Neurological: Negative.     Hematological: Negative.   Psychiatric/Behavioral: Negative.        Objective:   Physical Exam  Constitutional: He is oriented to person, place, and time. He appears well-developed and well-nourished. No distress.  HENT:  Head: Normocephalic.  Eyes: Conjunctivae and EOM are normal. Pupils are equal, round, and reactive to light. Right eye exhibits no discharge. Left eye exhibits no discharge. No scleral icterus.  Neck: Normal range of motion. Neck supple. No thyromegaly present.  Cardiovascular: Normal rate, regular rhythm, normal heart sounds and intact distal pulses.   No murmur heard. The heart is only slightly irregular today and EKG indicated this was a rhythm of PACs   Pulmonary/Chest: Effort normal and breath sounds normal. No respiratory distress. He has no wheezes. He has no rales. He exhibits no tenderness.  Abdominal: Soft. Bowel sounds are normal. He exhibits no mass. There is no tenderness.  Musculoskeletal: Normal range of motion. He exhibits edema. He exhibits no tenderness.  1+ pretibial edema  Lymphadenopathy:    He has no cervical adenopathy.  Neurological: He is alert and oriented to person, place, and time.  Skin: Skin is warm and dry. Rash noted. There is erythema. No pallor.  There was a rash and erythema of both lower extremities but this was improved from the previous visit  Psychiatric: He has a normal mood and affect. His behavior is normal. Judgment and thought content normal.  Nursing note and vitals reviewed.  BP 164/88 mmHg  Pulse 83  Temp(Src) 96.8 F (36  C) (Oral)  Ht 6' 2"  (1.88 m)  Wt 263 lb (119.296 kg)  BMI 33.75 kg/m2        Assessment & Plan:  1. Essential hypertension -The blood pressure was elevated today and the patient will get someone on the outside to record some additional readings and bring these to the visit at the next visit. -He will also try to do better with watching his sodium intake - BMP8+EGFR  2. Edema -Put support  hose on the first thing when getting out of bed in the morning and wear these regularly every day  3. Venous stasis of lower extremity -Wear support hose and continue taking Lasix  4. Cellulitis of right lower extremity -This is improved and patient will continue with Lasix and antibiotic until the next visit  5. Cellulitis of left lower extremity -Same as above  Patient Instructions  Continue to take Lasix twice daily We will get a BMP on you today and make sure your electrolytes and kidney function are stable with the Lasix twice a day Continue antibiotic Wear support hose and put them on the very first thing when you get out of bed in the morning Watch sodium intake Elevate feet as much as possible when sitting during the day   Arrie Senate MD

## 2014-07-27 ENCOUNTER — Telehealth: Payer: Self-pay | Admitting: Nurse Practitioner

## 2014-07-27 NOTE — Telephone Encounter (Signed)
FYI

## 2014-07-30 ENCOUNTER — Ambulatory Visit: Payer: Medicare Other | Admitting: Family Medicine

## 2014-08-16 ENCOUNTER — Encounter: Payer: Self-pay | Admitting: Family Medicine

## 2014-08-16 ENCOUNTER — Ambulatory Visit (INDEPENDENT_AMBULATORY_CARE_PROVIDER_SITE_OTHER): Payer: Medicare Other | Admitting: Family Medicine

## 2014-08-16 VITALS — BP 133/78 | HR 70 | Temp 98.1°F | Ht 74.0 in | Wt 259.0 lb

## 2014-08-16 DIAGNOSIS — J301 Allergic rhinitis due to pollen: Secondary | ICD-10-CM

## 2014-08-16 DIAGNOSIS — E559 Vitamin D deficiency, unspecified: Secondary | ICD-10-CM | POA: Diagnosis not present

## 2014-08-16 DIAGNOSIS — L03116 Cellulitis of left lower limb: Secondary | ICD-10-CM | POA: Diagnosis not present

## 2014-08-16 DIAGNOSIS — E785 Hyperlipidemia, unspecified: Secondary | ICD-10-CM | POA: Diagnosis not present

## 2014-08-16 DIAGNOSIS — H6123 Impacted cerumen, bilateral: Secondary | ICD-10-CM

## 2014-08-16 DIAGNOSIS — I10 Essential (primary) hypertension: Secondary | ICD-10-CM | POA: Insufficient documentation

## 2014-08-16 DIAGNOSIS — I878 Other specified disorders of veins: Secondary | ICD-10-CM

## 2014-08-16 DIAGNOSIS — N5201 Erectile dysfunction due to arterial insufficiency: Secondary | ICD-10-CM | POA: Diagnosis not present

## 2014-08-16 MED ORDER — LISINOPRIL 40 MG PO TABS: 40.0000 mg | ORAL_TABLET | Freq: Every day | ORAL | Status: DC

## 2014-08-16 NOTE — Addendum Note (Signed)
Addended by: Ilean China on: 08/16/2014 09:04 AM   Modules accepted: Orders, Medications

## 2014-08-16 NOTE — Addendum Note (Signed)
Addended by: Thana Ates on: 08/16/2014 08:56 AM   Modules accepted: Orders

## 2014-08-16 NOTE — Progress Notes (Signed)
Subjective:    Patient ID: [name], male    DOB: 30-Aug-1929, 81 y.o.   MRN: [id number]  HPI  81 year old male comes in today to follow up on cellulitis to bilateral lower extremity secondary to venous stasis. He completed his antibiotics and has been wearing compression stockings. He reports that it is has greatly improved. The patient is physically active and rest during the middle of the day. He denies chest pain shortness of breath trouble swallowing heartburn indigestion nausea vomiting diarrhea or problems with voiding.    Review of Systems  Constitutional: Negative.   HENT: Negative.   Eyes: Negative.   Respiratory: Negative.   Cardiovascular: Negative.   Gastrointestinal: Negative.   Endocrine: Negative.   Genitourinary: Negative.   Musculoskeletal: Negative.   Skin: Positive for color change and rash.       Bilateral lower extremities  Allergic/Immunologic: Negative.   Neurological: Negative.   Hematological: Negative.   Psychiatric/Behavioral: Negative.        Objective:   Physical Exam  Constitutional: He is oriented to person, place, and time. He appears well-developed and well-nourished. No distress.  HENT:  Head: Normocephalic and atraumatic.  Nose: Nose normal.  Mouth/Throat: Oropharynx is clear and moist. No oropharyngeal exudate.  Bilateral ear canal cerumen  Eyes: Conjunctivae and EOM are normal. Pupils are equal, round, and reactive to light. Right eye exhibits no discharge. Left eye exhibits no discharge. No scleral icterus.  Neck: Normal range of motion. Neck supple. No thyromegaly present.  No carotid bruits  Cardiovascular: Normal rate, normal heart sounds and intact distal pulses.  Exam reveals no friction rub.   No murmur heard. The rhythm is slightly irregular at about 72/m with what sounds like PVCs.  Pulmonary/Chest: Effort normal and breath sounds normal. No respiratory distress. He has no wheezes. He has no rales. He exhibits no  tenderness.  Clear anteriorly and posteriorly  Abdominal: Soft. Bowel sounds are normal. He exhibits no mass. There is no tenderness. There is no rebound and no guarding.  Musculoskeletal: Normal range of motion. He exhibits no edema or tenderness.  Lymphadenopathy:    He has no cervical adenopathy.  Neurological: He is alert and oriented to person, place, and time. A cranial nerve deficit is present.  Skin: Skin is warm and dry. No rash noted. No erythema. No pallor.  Psychiatric: He has a normal mood and affect. His behavior is normal. Judgment and thought content normal.  Nursing note and vitals reviewed.  BP 133/78 mmHg  Pulse 70  Temp(Src) 98.1 F (36.7 C) (Oral)  Ht 6\' 2"  (1.88 m)  Wt 259 lb (117.482 kg)  BMI 33.24 kg/m2        Assessment & Plan:  1. Venous stasis of lower extremity -This is greatly improved with his support stockings which she wears daily.  2. Essential hypertension -The blood pressure is good today he will continue with current treatment  3. Cellulitis of left lower extremity -The cellulitis has improved and he will continue wearing support stockings putting him on the first thing with arising every morning  4. Vitamin D deficiency -He says he does not take the vitamin D regularly and only takes it when he can remember it but we will still check a level to see where we are with this.  5. Hyperlipemia -He will continue with his omega-3 fatty acids and his diet for controlling his cholesterol pending results of lab work  6. Erectile dysfunction due to arterial  insufficiency -He is requesting some samples and we have some of Cialis 20 which we will give him.  7. Allergic rhinitis due to pollen -He will continue with his Flonase  8. Impacted cerumen of both ears -He will use Debrox eardrops for a couple weeks and return to the office for ear cerumen irrigation   No orders of the defined types were placed in this encounter.   Patient Instructions   Continue all meds Continue Lifestyle modification- diet and exercise Fall precautions discussed Keep follow up appointments with any specialists Continue to wear support hose and watch sodium intake                       Medicare Annual Wellness Visit  [facility] and the medical providers at [facility] strive to bring you the best medical care.  In doing so we not only want to address your current medical conditions and concerns but also to detect new conditions early and prevent illness, disease and health-related problems.    Medicare offers a yearly Wellness Visit which allows our clinical staff to assess your need for preventative services including immunizations, lifestyle education, counseling to decrease risk of preventable diseases and screening for fall risk and other medical concerns.    This visit is provided free of charge (no copay) for all Medicare recipients. The clinical pharmacists at [facility] have begun to conduct these Wellness Visits which will also include a thorough review of all your medications.    As you primary medical provider recommend that you make an appointment for your Annual Wellness Visit if you have not done so already this year.  You may set up this appointment before you leave today or you may call back [phone]) and schedule an appointment.  Please make sure when you call that you mention that you are scheduling your Annual Wellness Visit with the clinical pharmacist so that the appointment may be made for the proper length of time.    You may receive a survey about this visit. If so please take time to complete it.   You can purchase Debrox eardrops over-the-counter and use these nightly in each ear 3-4 drops for 2-3 nights wait a week and repeat this again for 2-3 nights in a row. Return to clinic in a couple weeks and have the nurse irrigate the cerumen out of the ear canals   Arrie Senate MD

## 2014-08-16 NOTE — Patient Instructions (Addendum)
Continue all meds Continue Lifestyle modification- diet and exercise Fall precautions discussed Keep follow up appointments with any specialists Continue to wear support hose and watch sodium intake                       Medicare Annual Wellness Visit  [facility] and the medical providers at [facility] strive to bring you the best medical care.  In doing so we not only want to address your current medical conditions and concerns but also to detect new conditions early and prevent illness, disease and health-related problems.    Medicare offers a yearly Wellness Visit which allows our clinical staff to assess your need for preventative services including immunizations, lifestyle education, counseling to decrease risk of preventable diseases and screening for fall risk and other medical concerns.    This visit is provided free of charge (no copay) for all Medicare recipients. The clinical pharmacists at [facility] have begun to conduct these Wellness Visits which will also include a thorough review of all your medications.    As you primary medical provider recommend that you make an appointment for your Annual Wellness Visit if you have not done so already this year.  You may set up this appointment before you leave today or you may call back [phone]) and schedule an appointment.  Please make sure when you call that you mention that you are scheduling your Annual Wellness Visit with the clinical pharmacist so that the appointment may be made for the proper length of time.    You may receive a survey about this visit. If so please take time to complete it.   You can purchase Debrox eardrops over-the-counter and use these nightly in each ear 3-4 drops for 2-3 nights wait a week and repeat this again for 2-3 nights in a row. Return to clinic in a couple weeks and have the nurse irrigate the cerumen out of the ear canals

## 2014-09-19 ENCOUNTER — Other Ambulatory Visit: Payer: Self-pay | Admitting: Family Medicine

## 2014-09-20 NOTE — Telephone Encounter (Signed)
Called patient and left message. Trying to confirm if he is having symptoms of cellutlitis again and that's why he is requesting refill. Awaiting return call

## 2014-09-20 NOTE — Telephone Encounter (Signed)
Get confirmation if possible. May refill prescription 1.

## 2014-12-17 ENCOUNTER — Ambulatory Visit: Payer: Medicare Other | Admitting: Family Medicine

## 2014-12-20 ENCOUNTER — Encounter: Payer: Self-pay | Admitting: Nurse Practitioner

## 2015-01-10 ENCOUNTER — Ambulatory Visit (INDEPENDENT_AMBULATORY_CARE_PROVIDER_SITE_OTHER): Payer: Medicare Other | Admitting: *Deleted

## 2015-01-10 DIAGNOSIS — Z23 Encounter for immunization: Secondary | ICD-10-CM

## 2015-02-04 ENCOUNTER — Ambulatory Visit (INDEPENDENT_AMBULATORY_CARE_PROVIDER_SITE_OTHER): Payer: Medicare Other

## 2015-02-04 VITALS — BP 160/96 | HR 82

## 2015-02-04 DIAGNOSIS — I10 Essential (primary) hypertension: Secondary | ICD-10-CM

## 2015-02-04 NOTE — Progress Notes (Signed)
Patient ID: [name], male   DOB: [birth year], 81 y.o.   MRN: [id number]   Patient presents to office today to make a follow up appt with Dr Laurance Flatten. Asked if his BP could be checked while he was here. He states that he feels just fine he just thought he would have it checked while he was here. It checked out at 160/96. Advised patient to keep monitoring at home and he agrees. He also states that he has put on some weight over the holidays and thinks this may be a factor in his increased BP. Will see DW[facility] next week. Advised to call the office if anything changes

## 2015-02-10 ENCOUNTER — Ambulatory Visit (INDEPENDENT_AMBULATORY_CARE_PROVIDER_SITE_OTHER): Payer: Medicare Other | Admitting: Family Medicine

## 2015-02-10 ENCOUNTER — Encounter: Payer: Self-pay | Admitting: Family Medicine

## 2015-02-10 ENCOUNTER — Ambulatory Visit (INDEPENDENT_AMBULATORY_CARE_PROVIDER_SITE_OTHER): Payer: Medicare Other

## 2015-02-10 VITALS — BP 162/92 | HR 72 | Temp 97.0°F | Ht 74.0 in | Wt 264.0 lb

## 2015-02-10 DIAGNOSIS — Z23 Encounter for immunization: Secondary | ICD-10-CM

## 2015-02-10 DIAGNOSIS — I10 Essential (primary) hypertension: Secondary | ICD-10-CM | POA: Diagnosis not present

## 2015-02-10 DIAGNOSIS — I878 Other specified disorders of veins: Secondary | ICD-10-CM | POA: Diagnosis not present

## 2015-02-10 DIAGNOSIS — E559 Vitamin D deficiency, unspecified: Secondary | ICD-10-CM | POA: Diagnosis not present

## 2015-02-10 DIAGNOSIS — L03116 Cellulitis of left lower limb: Secondary | ICD-10-CM

## 2015-02-10 DIAGNOSIS — Z0000 Encounter for general adult medical examination without abnormal findings: Secondary | ICD-10-CM

## 2015-02-10 DIAGNOSIS — J301 Allergic rhinitis due to pollen: Secondary | ICD-10-CM

## 2015-02-10 DIAGNOSIS — E785 Hyperlipidemia, unspecified: Secondary | ICD-10-CM | POA: Diagnosis not present

## 2015-02-10 LAB — POCT URINALYSIS DIPSTICK
Bilirubin, UA: NEGATIVE
Blood, UA: NEGATIVE
Glucose, UA: NEGATIVE
Ketones, UA: NEGATIVE
Leukocytes, UA: NEGATIVE
Nitrite, UA: NEGATIVE
Protein, UA: NEGATIVE
Spec Grav, UA: <=1.005
Urobilinogen, UA: NEGATIVE
pH, UA: 6.5

## 2015-02-10 LAB — POCT UA - MICROSCOPIC ONLY
Bacteria, U Microscopic: NEGATIVE
Casts, Ur, LPF, POC: NEGATIVE
Crystals, Ur, HPF, POC: NEGATIVE
Mucus, UA: NEGATIVE
RBC, urine, microscopic: NEGATIVE
WBC, Ur, HPF, POC: NEGATIVE
Yeast, UA: NEGATIVE

## 2015-02-10 MED ORDER — LISINOPRIL 40 MG PO TABS: 40.0000 mg | ORAL_TABLET | Freq: Every day | ORAL | Status: DC

## 2015-02-10 MED ORDER — VITAMIN D (ERGOCALCIFEROL) 1.25 MG (50000 UNIT) PO CAPS: 50000.0000 [IU] | ORAL_CAPSULE | ORAL | Status: DC

## 2015-02-10 MED ORDER — FLUTICASONE PROPIONATE 50 MCG/ACT NA SUSP: 1.0000 | Freq: Every day | NASAL | Status: DC

## 2015-02-10 NOTE — Progress Notes (Signed)
Subjective:    Patient ID: [name], male    DOB: 1929/10/12, 82 y.o.   MRN: [id number]  HPI Patient is here today for his chronic follow up. The patient has been doing well and has no complaints. He indicates that the support hose helped his venous stasis and cellulitis during the summer months. And he will wear them again starting this summer. He denies chest pain shortness of breath trouble swallowing and heartburn indigestion nausea vomiting diarrhea or blood in the stool. His passing his water without problems. He says every now and then he has to catch his breath and is not associated with physical activity and is not at nighttime. He knows that he needs to lose some weight as he has gained 5 pounds since he was here last. He wants refills on all of his medicines.   Review of Systems  Constitutional: Negative.   HENT: Negative.   Eyes: Negative.   Respiratory: Negative.   Cardiovascular: Negative.   Gastrointestinal: Negative.   Endocrine: Negative.   Genitourinary: Negative.   Musculoskeletal: Negative.   Skin: Negative.   Allergic/Immunologic: Negative.   Neurological: Negative.   Hematological: Negative.   Psychiatric/Behavioral: Negative.         Patient Active Problem List   Diagnosis Date Noted  . Essential hypertension 08/16/2014  . Hyperlipemia 08/16/2014  . Peripheral edema 06/10/2012  . Hypertension 05/10/2010  . Vitamin D deficiency 05/10/2010  . Arthritis 05/10/2010   Outpatient Encounter Prescriptions as of 02/10/2015  Medication Sig  . fish oil-omega-3 fatty acids 1000 MG capsule Take 2 g by mouth daily.    . fluticasone (FLONASE) 50 MCG/ACT nasal spray   . furosemide (LASIX) 20 MG tablet Take 1 tablet (20 mg total) by mouth 2 (two) times daily. (Patient taking differently: Take 20 mg by mouth daily. )  . lisinopril (PRINIVIL,ZESTRIL) 40 MG tablet Take 1 tablet (40 mg total) by mouth daily.  . Vitamin D, Ergocalciferol, (DRISDOL) 50000 UNITS CAPS  capsule Take 1 capsule (50,000 Units total) by mouth every 7 (seven) days.  . [DISCONTINUED] cephALEXin (KEFLEX) 500 MG capsule TAKE ONE CAPSULE BY MOUTH THREE TIMES DAILY   No facility-administered encounter medications on file as of 02/10/2015.       Objective:   Physical Exam  Constitutional: He is oriented to person, place, and time. He appears well-developed and well-nourished. No distress.  HENT:  Head: Normocephalic and atraumatic.  Right Ear: External ear normal.  Left Ear: External ear normal.  Nose: Nose normal.  Mouth/Throat: Oropharynx is clear and moist. No oropharyngeal exudate.  Eyes: Conjunctivae and EOM are normal. Pupils are equal, round, and reactive to light. Right eye exhibits no discharge. Left eye exhibits no discharge. No scleral icterus.  Neck: Normal range of motion. Neck supple. No thyromegaly present.  No carotid bruits or thyromegaly  Cardiovascular: Normal rate, regular rhythm, normal heart sounds and intact distal pulses.   No murmur heard. The posterior tibial pulses in the feet were palpable but were difficult to palpate. Heart was regular at 72/m  Pulmonary/Chest: Effort normal and breath sounds normal. No respiratory distress. He has no wheezes. He has no rales. He exhibits no tenderness.  Clear anteriorly and posteriorly no axillary adenopathy  Abdominal: Soft. Bowel sounds are normal. He exhibits no mass. There is no tenderness. There is no rebound and no guarding.  The abdomen is obese without masses tenderness or organ enlargement  Genitourinary: Rectum normal and penis normal.  The prostate was  slightly enlarged without lumps or masses and there were no rectal masses. There may be a small inguinal hernia on the left. There may be a hydrocele on the right. Otherwise the external genitalia were within normal limits.  Musculoskeletal: Normal range of motion. He exhibits no edema or tenderness.  Lymphadenopathy:    He has no cervical adenopathy.    Neurological: He is alert and oriented to person, place, and time. He has normal reflexes. No cranial nerve deficit.  Skin: Skin is warm and dry. No rash noted. No erythema. No pallor.  The patient has scars of cellulitis on both lower extremities secondary to his venous insufficiency.  Psychiatric: He has a normal mood and affect. His behavior is normal. Judgment and thought content normal.  Nursing note and vitals reviewed.  BP 162/92 mmHg  Pulse 72  Ht 6' 2"  (1.88 m)  Wt 264 lb (119.75 kg)  BMI 33.88 kg/m2  [facility] reading (PRIMARY) by  Dr. Brunilda Payor x-ray-  pending                                    Assessment & Plan:  1. Essential hypertension -The blood pressure is elevated today and the patient understands he needs to lose some weight and watch his sodium intake more closely. -He will return to clinic in a couple weeks to have his blood pressure rechecked by the nurse and he will make sure that he is taking his medicine regularly. - BMP8+EGFR - CBC with Differential/Platelet - DG Chest 2 View; Future  2. Vitamin D deficiency -Continue vitamin D replacement pending results of lab work - VITAMIN D 25 Hydroxy (Vit-D Deficiency, Fractures)  3. Hyperlipemia -Continue with aggressive therapeutic lifestyle changes and visual pending results of lab work. - Lipid panel - Hepatic function panel  4. Need for pneumococcal vaccination -He will get the childhood pneumonia shot today and will return to clinic in 1 year for the Pneumovax. - Pneumococcal conjugate vaccine 13-valent  5. Allergic rhinitis due to pollen -We will refill his Flonase and it will be available for the summertime use in the spring time use.  6. Venous stasis of lower extremity -Continue wearing support hose in the warmer months of the year  7. Cellulitis of left lower extremity -Continue to use support hose in the warmer months of the year  Meds ordered this encounter  Medications  . Vitamin D,  Ergocalciferol, (DRISDOL) 50000 units CAPS capsule    Sig: Take 1 capsule (50,000 Units total) by mouth every 7 (seven) days.    Dispense:  12 capsule    Refill:  3  . fluticasone (FLONASE) 50 MCG/ACT nasal spray    Sig: Place 1 spray into both nostrils daily.    Dispense:  16 g    Refill:  12      Patient Instructions  Continue to drink plenty of fluids and stay well hydrated this winter In the summertime continue to wear support hose to help with venous insufficiency Try to do better with diet and exercise and lose as much weight as possible Return the FOBT We will call you with the blood work as soon as it becomes available The pneumonia shot that you received today may make your arm sore. You will be due to get another pneumonia shot in 1 year.  Return to clinic in 2 weeks to recheck blood pressure and get some readings outside of  possible   Arrie Senate MD

## 2015-02-10 NOTE — Patient Instructions (Addendum)
Continue to drink plenty of fluids and stay well hydrated this winter In the summertime continue to wear support hose to help with venous insufficiency Try to do better with diet and exercise and lose as much weight as possible Return the FOBT We will call you with the blood work as soon as it becomes available The pneumonia shot that you received today may make your arm sore. You will be due to get another pneumonia shot in 1 year.  Return to clinic in 2 weeks to recheck blood pressure and get some readings outside of possible

## 2015-02-11 ENCOUNTER — Telehealth: Payer: Self-pay | Admitting: Family Medicine

## 2015-02-11 LAB — HEPATIC FUNCTION PANEL
ALT: 17 IU/L (ref 0–44)
AST: 19 IU/L (ref 0–40)
Albumin: 4.4 g/dL (ref 3.5–4.7)
Alkaline Phosphatase: 78 IU/L (ref 39–117)
Bilirubin Total: 0.6 mg/dL (ref 0.0–1.2)
Bilirubin, Direct: 0.16 mg/dL (ref 0.00–0.40)
Total Protein: 7 g/dL (ref 6.0–8.5)

## 2015-02-11 LAB — CBC WITH DIFFERENTIAL/PLATELET
Basophils Absolute: 0 10*3/uL (ref 0.0–0.2)
Basos: 0 %
EOS (ABSOLUTE): 0.1 10*3/uL (ref 0.0–0.4)
Eos: 1 %
Hematocrit: 48.9 % (ref 37.5–51.0)
Hemoglobin: 16.9 g/dL (ref 12.6–17.7)
Immature Grans (Abs): 0 10*3/uL (ref 0.0–0.1)
Immature Granulocytes: 0 %
Lymphocytes Absolute: 3.2 10*3/uL — ABNORMAL HIGH (ref 0.7–3.1)
Lymphs: 34 %
MCH: 31.3 pg (ref 26.6–33.0)
MCHC: 34.6 g/dL (ref 31.5–35.7)
MCV: 91 fL (ref 79–97)
Monocytes Absolute: 0.9 10*3/uL (ref 0.1–0.9)
Monocytes: 10 %
Neutrophils Absolute: 5.2 10*3/uL (ref 1.4–7.0)
Neutrophils: 55 %
Platelets: 296 10*3/uL (ref 150–379)
RBC: 5.4 x10E6/uL (ref 4.14–5.80)
RDW: 12.7 % (ref 12.3–15.4)
WBC: 9.4 10*3/uL (ref 3.4–10.8)

## 2015-02-11 LAB — BMP8+EGFR
BUN/Creatinine Ratio: 14 (ref 10–22)
BUN: 16 mg/dL (ref 8–27)
CO2: 26 mmol/L (ref 18–29)
Calcium: 9.6 mg/dL (ref 8.6–10.2)
Chloride: 95 mmol/L — ABNORMAL LOW (ref 96–106)
Creatinine, Ser: 1.14 mg/dL (ref 0.76–1.27)
GFR calc Af Amer: 68 mL/min/{1.73_m2} (ref >59)
GFR calc non Af Amer: 59 mL/min/{1.73_m2} — ABNORMAL LOW (ref >59)
Glucose: 88 mg/dL (ref 65–99)
Potassium: 4.5 mmol/L (ref 3.5–5.2)
Sodium: 136 mmol/L (ref 134–144)

## 2015-02-11 LAB — LIPID PANEL
Chol/HDL Ratio: 3.8 ratio units (ref 0.0–5.0)
Cholesterol, Total: 218 mg/dL — ABNORMAL HIGH (ref 100–199)
HDL: 57 mg/dL (ref >39)
LDL Calculated: 140 mg/dL — ABNORMAL HIGH (ref 0–99)
Triglycerides: 103 mg/dL (ref 0–149)
VLDL Cholesterol Cal: 21 mg/dL (ref 5–40)

## 2015-02-11 LAB — VITAMIN D 25 HYDROXY (VIT D DEFICIENCY, FRACTURES): Vit D, 25-Hydroxy: 25.2 ng/mL — ABNORMAL LOW (ref 30.0–100.0)

## 2015-02-11 NOTE — Telephone Encounter (Signed)
Spoke with patient.

## 2015-05-04 DIAGNOSIS — L570 Actinic keratosis: Secondary | ICD-10-CM | POA: Diagnosis not present

## 2015-05-04 DIAGNOSIS — Z85828 Personal history of other malignant neoplasm of skin: Secondary | ICD-10-CM | POA: Diagnosis not present

## 2015-05-04 DIAGNOSIS — D485 Neoplasm of uncertain behavior of skin: Secondary | ICD-10-CM | POA: Diagnosis not present

## 2015-05-25 ENCOUNTER — Encounter: Payer: Self-pay | Admitting: Family Medicine

## 2015-05-25 ENCOUNTER — Ambulatory Visit (INDEPENDENT_AMBULATORY_CARE_PROVIDER_SITE_OTHER): Payer: Medicare Other | Admitting: Family Medicine

## 2015-05-25 ENCOUNTER — Encounter (INDEPENDENT_AMBULATORY_CARE_PROVIDER_SITE_OTHER): Payer: Self-pay

## 2015-05-25 ENCOUNTER — Encounter: Payer: Self-pay | Admitting: *Deleted

## 2015-05-25 VITALS — BP 149/83 | HR 65 | Temp 96.8°F | Ht 74.0 in | Wt 269.1 lb

## 2015-05-25 DIAGNOSIS — H9202 Otalgia, left ear: Secondary | ICD-10-CM

## 2015-05-25 NOTE — Progress Notes (Signed)
   Subjective:  Patient ID: [name], male    DOB: 26-Jan-1930  Age: 82 y.o. MRN: [id number]  CC: left ear stopped up   HPI [name] presents for  Can't hear from left ear for 4 days. Got a lot of wax out with sweet oil and a q-tip, but still can'thear   History [name] has a past medical history of Hypertension.   [name] has past surgical history that includes Joint replacement.   His family history includes Cancer in his mother; Stroke in his father.[name] reports that [name] has never smoked. [name] does not have any smokeless tobacco history on file. [name] reports that [name] does not drink alcohol or use illicit drugs.    ROS Review of Systems  Constitutional: Negative for activity change and appetite change.  HENT: Positive for hearing loss. Negative for congestion, ear pain, sinus pressure and sore throat.     Objective:  BP 149/83 mmHg  Pulse 65  Temp(Src) 96.8 F (36 C) (Oral)  Ht 6\' 2"  (1.88 m)  Wt 269 lb 2 oz (122.074 kg)  BMI 34.54 kg/m2  BP Readings from Last 3 Encounters:  05/25/15 149/83  02/10/15 162/92  02/04/15 160/96    Wt Readings from Last 3 Encounters:  05/25/15 269 lb 2 oz (122.074 kg)  02/10/15 264 lb (119.75 kg)  08/16/14 259 lb (117.482 kg)     Physical Exam  Constitutional: [name] appears well-developed and well-nourished.  HENT:  Head: Normocephalic and atraumatic.  Right Ear: Tympanic membrane normal. No decreased hearing is noted.  Left Ear: Decreased hearing is noted.  Mouth/Throat: No oropharyngeal exudate or posterior oropharyngeal erythema.  Cerumen impacted bilaterally   Neck: No Brudzinski's sign noted.  Pulmonary/Chest: Effort normal.  Lymphadenopathy:       Head (right side): No preauricular adenopathy present.       Head (left side): No preauricular adenopathy present.       Right cervical: No superficial cervical adenopathy present.      Left cervical: No superficial cervical adenopathy present.   After lavage tMs noted nml. Hearing  intact - improved subjectively  Lab Results  Component Value Date   WBC 9.4 02/10/2015   HGB 15.3 08/28/2013   HCT 48.9 02/10/2015   PLT 296 02/10/2015   GLUCOSE 88 02/10/2015   CHOL 218* 02/10/2015   TRIG 103 02/10/2015   HDL 57 02/10/2015   LDLCALC 140* 02/10/2015   ALT 17 02/10/2015   AST 19 02/10/2015   NA 136 02/10/2015   K 4.5 02/10/2015   CL 95* 02/10/2015   CREATININE 1.14 02/10/2015   BUN 16 02/10/2015   CO2 26 02/10/2015   TSH 3.620 05/08/2014    No results found.  Assessment & Plan:   [name] was seen today for left ear stopped up.  Diagnoses and all orders for this visit:  Otalgia, left   Lavage produced moderate wax. Hearing improved.  I have discontinued [name] fluticasone. I am also having him maintain his fish oil-omega-3 fatty acids, furosemide, Vitamin D (Ergocalciferol), and lisinopril.  No orders of the defined types were placed in this encounter.     Follow-up: Return if symptoms worsen or fail to improve.  Claretta Fraise, M.D.

## 2015-07-08 ENCOUNTER — Ambulatory Visit: Payer: Medicare Other | Admitting: Family Medicine

## 2015-07-11 ENCOUNTER — Encounter: Payer: Self-pay | Admitting: Family Medicine

## 2015-07-26 ENCOUNTER — Ambulatory Visit (INDEPENDENT_AMBULATORY_CARE_PROVIDER_SITE_OTHER): Payer: Medicare Other | Admitting: Family Medicine

## 2015-07-26 ENCOUNTER — Encounter: Payer: Self-pay | Admitting: Family Medicine

## 2015-07-26 VITALS — BP 142/94 | HR 73 | Temp 97.4°F | Ht 74.0 in | Wt 261.8 lb

## 2015-07-26 DIAGNOSIS — I10 Essential (primary) hypertension: Secondary | ICD-10-CM | POA: Diagnosis not present

## 2015-07-26 DIAGNOSIS — E559 Vitamin D deficiency, unspecified: Secondary | ICD-10-CM

## 2015-07-26 DIAGNOSIS — Z6834 Body mass index (BMI) 34.0-34.9, adult: Secondary | ICD-10-CM

## 2015-07-26 DIAGNOSIS — E785 Hyperlipidemia, unspecified: Secondary | ICD-10-CM

## 2015-07-26 NOTE — Patient Instructions (Addendum)
Continue current medications. Continue good therapeutic lifestyle changes which include good diet and exercise. Fall precautions discussed with patient. If an FOBT was given today- please return it to our front desk. If you are over 80 years old - you may need Prevnar 74 or the adult Pneumonia vaccine.  Flu Shots will be available at our office starting mid- September. Please call and schedule a FLU CLINIC APPOINTMENT.                        Medicare Annual Wellness Visit  [facility] and the medical providers at [facility] strive to bring you the best medical care.  In doing so we not only want to address your current medical conditions and concerns but also to detect new conditions early and prevent illness, disease and health-related problems.    Medicare offers a yearly Wellness Visit which allows our clinical staff to assess your need for preventative services including immunizations, lifestyle education, counseling to decrease risk of preventable diseases and screening for fall risk and other medical concerns.    This visit is provided free of charge (no copay) for all Medicare recipients. The clinical pharmacists at [facility] have begun to conduct these Wellness Visits which will also include a thorough review of all your medications.    As you primary medical provider recommend that you make an appointment for your Annual Wellness Visit if you have not done so already this year.  You may set up this appointment before you leave today or you may call back [phone] and schedule an appointment.  Please make sure when you call that you mention that you are scheduling your Annual Wellness Visit with the clinical pharmacist so that the appointment may be made for the proper length of time.    The patient should make all possible efforts to lose weight and continue to watch his sodium intake He should return the FOBT He should have the nurse to  check his blood pressure in about 4 weeks and bring any outside readings to that visit

## 2015-07-26 NOTE — Progress Notes (Signed)
Subjective:    Patient ID: [name], male    DOB: Nov 04, 1929, 82 y.o.   MRN: [id number]  HPI Patient is here today for a follow up on his hypertension and hyperlipidemia. Patient has no other complaints today. The patient is doing well and absolutely has no complaints. He continues to wear support hose and has not had any edema in his legs or feet. His made an effort to work on his weight and has lost about 7 or 8 pounds of weight. He denies any chest pain shortness of breath trouble swallowing heartburn indigestion nausea vomiting diarrhea blood in the stool or black tarry bowel movements. He is passing his water without problems. 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 He occasionally gets some one to check his blood pressure and says that these readings are always good but cannot give me the specific numbers. A repeat blood pressure in the office today by me with a large cuff and annually in the right arm was 142/88. He indicates that he was taking vitamin D 50,000 units until one week ago when he ran out.   Review of Systems  Constitutional: Negative.   HENT: Negative.   Eyes: Negative.   Respiratory: Negative.   Cardiovascular: Negative.   Gastrointestinal: Negative.   Endocrine: Negative.   Genitourinary: Negative.   Musculoskeletal: Negative.   Skin: Negative.   Allergic/Immunologic: Negative.   Neurological: Negative.   Hematological: Negative.   Psychiatric/Behavioral: Negative.      Patient Active Problem List   Diagnosis Date Noted  . Essential hypertension 08/16/2014  . Hyperlipemia 08/16/2014  . Peripheral edema 06/10/2012  . Hypertension 05/10/2010  . Vitamin D deficiency 05/10/2010  . Arthritis 05/10/2010   Outpatient Encounter Prescriptions as of 07/26/2015  Medication Sig  . fish oil-omega-3 fatty acids 1000 MG capsule Take 2 g by mouth daily.    . furosemide (LASIX) 20 MG tablet Take 1 tablet (20 mg total) by mouth 2 (two) times daily. (Patient taking  differently: Take 20 mg by mouth daily. )  . lisinopril (PRINIVIL,ZESTRIL) 40 MG tablet Take 1 tablet (40 mg total) by mouth daily.  . Vitamin D, Ergocalciferol, (DRISDOL) 50000 units CAPS capsule Take 1 capsule (50,000 Units total) by mouth every 7 (seven) days.   No facility-administered encounter medications on file as of 07/26/2015.       Objective:   Physical Exam  Constitutional: He is oriented to person, place, and time. He appears well-developed and well-nourished. No distress.  Pleasant kind and alert  HENT:  Head: Normocephalic and atraumatic.  Right Ear: External ear normal.  Left Ear: External ear normal.  Nose: Nose normal.  Mouth/Throat: Oropharynx is clear and moist. No oropharyngeal exudate.  Eyes: Conjunctivae and EOM are normal. Pupils are equal, round, and reactive to light. Right eye exhibits no discharge. Left eye exhibits no discharge. No scleral icterus.  Neck: Normal range of motion. Neck supple. No thyromegaly present.  No bruits thyromegaly or anterior cervical adenopathy  Cardiovascular: Normal rate, regular rhythm, normal heart sounds and intact distal pulses.  Exam reveals no gallop and no friction rub.   No murmur heard. Distal pulses in the feet were present but difficult to palpate. Heart is 72/m with a regular rate and rhythm  Pulmonary/Chest: Effort normal and breath sounds normal. No respiratory distress. He has no wheezes. He has no rales. He exhibits no tenderness.  Clear anteriorly and posteriorly  Abdominal: Soft. Bowel sounds are normal. He exhibits no mass. There is  no tenderness. There is no rebound and no guarding.  Abdominal obesity without masses tenderness or organ enlargement  Genitourinary: Rectum normal and penis normal.  The prostate was enlarged but soft and tender. There are no rectal masses. There were no prostate masses. Genitalia were normal and there were no inguinal hernias palpable area  Musculoskeletal: Normal range of motion.  He exhibits no edema or tenderness.  Lymphadenopathy:    He has no cervical adenopathy.  Neurological: He is alert and oriented to person, place, and time. He has normal reflexes. No cranial nerve deficit.  Skin: Skin is warm and dry. No rash noted.  Psychiatric: He has a normal mood and affect. His behavior is normal. Judgment and thought content normal.  Nursing note and vitals reviewed.  BP 142/94 mmHg  Pulse 73  Temp(Src) 97.4 F (36.3 C) (Oral)  Ht 6' 2"  (1.88 m)  Wt 261 lb 12.8 oz (118.752 kg)  BMI 33.60 kg/m2       Assessment & Plan:  1. Essential hypertension -The patient should continue with current medicines and should come by the office in about 4 weeks and have the nurse recheck his blood pressure and bring some outside readings at that time -He should continue to watch his sodium intake and make every effort to lose some more weight - BMP8+EGFR - CBC with Differential/Platelet  2. Vitamin D deficiency -He should resume his vitamin D weekly at least until the readings from his lab work are returned - VITAMIN D 25 Hydroxy (Vit-D Deficiency, Fractures)  3. Hyperlipemia -Continue omega-3 fatty acids pending results of lab work - Hepatic function panel - Lipid panel  4. BMI 34.0-34.9,adult -Make every effort at weight loss through diet and exercise and drink more water  Patient Instructions  Continue current medications. Continue good therapeutic lifestyle changes which include good diet and exercise. Fall precautions discussed with patient. If an FOBT was given today- please return it to our front desk. If you are over 50 years old - you may need Prevnar 65 or the adult Pneumonia vaccine.  Flu Shots will be available at our office starting mid- September. Please call and schedule a FLU CLINIC APPOINTMENT.                        Medicare Annual Wellness Visit  [facility] and the medical providers at [facility] strive to bring you the  best medical care.  In doing so we not only want to address your current medical conditions and concerns but also to detect new conditions early and prevent illness, disease and health-related problems.    Medicare offers a yearly Wellness Visit which allows our clinical staff to assess your need for preventative services including immunizations, lifestyle education, counseling to decrease risk of preventable diseases and screening for fall risk and other medical concerns.    This visit is provided free of charge (no copay) for all Medicare recipients. The clinical pharmacists at [facility] have begun to conduct these Wellness Visits which will also include a thorough review of all your medications.    As you primary medical provider recommend that you make an appointment for your Annual Wellness Visit if you have not done so already this year.  You may set up this appointment before you leave today or you may call back ([phone]) and schedule an appointment.  Please make sure when you call that you mention that you are scheduling your  Annual Wellness Visit with the clinical pharmacist so that the appointment may be made for the proper length of time.    The patient should make all possible efforts to lose weight and continue to watch his sodium intake He should return the FOBT He should have the nurse to check his blood pressure in about 4 weeks and bring any outside readings to that visit   Arrie Senate MD

## 2015-07-27 ENCOUNTER — Other Ambulatory Visit: Payer: Self-pay | Admitting: *Deleted

## 2015-07-27 LAB — BMP8+EGFR
BUN/Creatinine Ratio: 18 (ref 10–24)
BUN: 20 mg/dL (ref 8–27)
CO2: 27 mmol/L (ref 18–29)
Calcium: 9.4 mg/dL (ref 8.6–10.2)
Chloride: 102 mmol/L (ref 96–106)
Creatinine, Ser: 1.12 mg/dL (ref 0.76–1.27)
GFR calc Af Amer: 69 mL/min/{1.73_m2} (ref >59)
GFR calc non Af Amer: 60 mL/min/{1.73_m2} (ref >59)
Glucose: 76 mg/dL (ref 65–99)
Potassium: 4.7 mmol/L (ref 3.5–5.2)
Sodium: 143 mmol/L (ref 134–144)

## 2015-07-27 LAB — CBC WITH DIFFERENTIAL/PLATELET
Basophils Absolute: 0 10*3/uL (ref 0.0–0.2)
Basos: 0 %
EOS (ABSOLUTE): 0.1 10*3/uL (ref 0.0–0.4)
Eos: 1 %
Hematocrit: 46.8 % (ref 37.5–51.0)
Hemoglobin: 15.8 g/dL (ref 12.6–17.7)
Immature Grans (Abs): 0 10*3/uL (ref 0.0–0.1)
Immature Granulocytes: 0 %
Lymphocytes Absolute: 2.6 10*3/uL (ref 0.7–3.1)
Lymphs: 28 %
MCH: 31.5 pg (ref 26.6–33.0)
MCHC: 33.8 g/dL (ref 31.5–35.7)
MCV: 93 fL (ref 79–97)
Monocytes Absolute: 1 10*3/uL — ABNORMAL HIGH (ref 0.1–0.9)
Monocytes: 11 %
Neutrophils Absolute: 5.7 10*3/uL (ref 1.4–7.0)
Neutrophils: 60 %
Platelets: 255 10*3/uL (ref 150–379)
RBC: 5.02 x10E6/uL (ref 4.14–5.80)
RDW: 12.8 % (ref 12.3–15.4)
WBC: 9.4 10*3/uL (ref 3.4–10.8)

## 2015-07-27 LAB — HEPATIC FUNCTION PANEL
ALT: 12 IU/L (ref 0–44)
AST: 17 IU/L (ref 0–40)
Albumin: 4.3 g/dL (ref 3.5–4.7)
Alkaline Phosphatase: 66 IU/L (ref 39–117)
Bilirubin Total: 0.5 mg/dL (ref 0.0–1.2)
Bilirubin, Direct: 0.16 mg/dL (ref 0.00–0.40)
Total Protein: 6.8 g/dL (ref 6.0–8.5)

## 2015-07-27 LAB — VITAMIN D 25 HYDROXY (VIT D DEFICIENCY, FRACTURES): Vit D, 25-Hydroxy: 27.9 ng/mL — ABNORMAL LOW (ref 30.0–100.0)

## 2015-07-27 LAB — LIPID PANEL
Chol/HDL Ratio: 3.3 ratio units (ref 0.0–5.0)
Cholesterol, Total: 193 mg/dL (ref 100–199)
HDL: 59 mg/dL (ref >39)
LDL Calculated: 113 mg/dL — ABNORMAL HIGH (ref 0–99)
Triglycerides: 103 mg/dL (ref 0–149)
VLDL Cholesterol Cal: 21 mg/dL (ref 5–40)

## 2015-07-27 MED ORDER — VITAMIN D (ERGOCALCIFEROL) 1.25 MG (50000 UNIT) PO CAPS: 50000.0000 [IU] | ORAL_CAPSULE | ORAL | Status: DC

## 2015-08-06 ENCOUNTER — Other Ambulatory Visit: Payer: Self-pay | Admitting: Family Medicine

## 2015-09-01 DIAGNOSIS — D485 Neoplasm of uncertain behavior of skin: Secondary | ICD-10-CM | POA: Diagnosis not present

## 2015-09-01 DIAGNOSIS — L570 Actinic keratosis: Secondary | ICD-10-CM | POA: Diagnosis not present

## 2015-09-01 DIAGNOSIS — Z85828 Personal history of other malignant neoplasm of skin: Secondary | ICD-10-CM | POA: Diagnosis not present

## 2015-09-01 DIAGNOSIS — C44622 Squamous cell carcinoma of skin of right upper limb, including shoulder: Secondary | ICD-10-CM | POA: Diagnosis not present

## 2015-09-22 DIAGNOSIS — C44622 Squamous cell carcinoma of skin of right upper limb, including shoulder: Secondary | ICD-10-CM | POA: Diagnosis not present

## 2015-10-04 ENCOUNTER — Other Ambulatory Visit: Payer: Self-pay | Admitting: *Deleted

## 2015-10-04 DIAGNOSIS — I10 Essential (primary) hypertension: Secondary | ICD-10-CM

## 2015-10-04 DIAGNOSIS — I878 Other specified disorders of veins: Secondary | ICD-10-CM

## 2015-10-04 MED ORDER — FUROSEMIDE 20 MG PO TABS: 20.0000 mg | ORAL_TABLET | Freq: Two times a day (BID) | ORAL | 3 refills | Status: DC

## 2015-10-06 ENCOUNTER — Other Ambulatory Visit: Payer: Self-pay | Admitting: *Deleted

## 2015-10-17 ENCOUNTER — Telehealth: Payer: Self-pay | Admitting: Family Medicine

## 2015-10-17 NOTE — Telephone Encounter (Signed)
Please advise and route to Pool A 

## 2015-11-10 ENCOUNTER — Other Ambulatory Visit: Payer: Self-pay | Admitting: Family Medicine

## 2015-12-07 ENCOUNTER — Ambulatory Visit (INDEPENDENT_AMBULATORY_CARE_PROVIDER_SITE_OTHER): Payer: Medicare Other | Admitting: Family Medicine

## 2015-12-07 ENCOUNTER — Encounter: Payer: Self-pay | Admitting: Family Medicine

## 2015-12-07 VITALS — BP 147/87 | HR 62 | Temp 97.0°F | Ht 74.0 in | Wt 269.0 lb

## 2015-12-07 DIAGNOSIS — E559 Vitamin D deficiency, unspecified: Secondary | ICD-10-CM | POA: Diagnosis not present

## 2015-12-07 DIAGNOSIS — N5201 Erectile dysfunction due to arterial insufficiency: Secondary | ICD-10-CM | POA: Diagnosis not present

## 2015-12-07 DIAGNOSIS — H6121 Impacted cerumen, right ear: Secondary | ICD-10-CM | POA: Diagnosis not present

## 2015-12-07 DIAGNOSIS — Z23 Encounter for immunization: Secondary | ICD-10-CM | POA: Diagnosis not present

## 2015-12-07 DIAGNOSIS — L03116 Cellulitis of left lower limb: Secondary | ICD-10-CM | POA: Diagnosis not present

## 2015-12-07 DIAGNOSIS — E7800 Pure hypercholesterolemia, unspecified: Secondary | ICD-10-CM | POA: Diagnosis not present

## 2015-12-07 DIAGNOSIS — I10 Essential (primary) hypertension: Secondary | ICD-10-CM

## 2015-12-07 NOTE — Progress Notes (Signed)
Subjective:    Patient ID: [name], male    DOB: Mar 13, 1929, 82 y.o.   MRN: [id number]  HPI Pt here for follow up and management of chronic medical problems which includes hypertension and hyperlipidemia. He is taking medications regularly.He is not taking anything for his cholesterol other than watching his diet. He does take omega-3 fatty acids. He takes Lasix 20 and lisinopril 40 for his blood pressure. As usual he has no complaints and needs no refills. He is getting his flu shot today. He is getting lab work today also. The patient's weight is up 7 pounds from the last visit when it was 262. The patient says he waffles a lot with his weight he eats a lot of bread and doesn't eat as healthy as he should. He tries to eat more vegetables and loses weight. 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 He denies any chest pain or palpitations or shortness of breath. He denies any trouble with his stomach other than rare heartburn which only lasts for short period of time. He denies any blood in the stool or black tarry bowel movements. He is passing his water without problems.    Patient Active Problem List   Diagnosis Date Noted  . Essential hypertension 08/16/2014  . Hyperlipemia 08/16/2014  . Peripheral edema 06/10/2012  . Hypertension 05/10/2010  . Vitamin D deficiency 05/10/2010  . Arthritis 05/10/2010   Outpatient Encounter Prescriptions as of 12/07/2015  Medication Sig  . fish oil-omega-3 fatty acids 1000 MG capsule Take 2 g by mouth daily.    . furosemide (LASIX) 20 MG tablet Take 1 tablet (20 mg total) by mouth 2 (two) times daily.  Marland Kitchen lisinopril (PRINIVIL,ZESTRIL) 40 MG tablet Take 1 Tablet by mouth once daily  . Vitamin D, Ergocalciferol, (DRISDOL) 50000 units CAPS capsule Take 1 capsule (50,000 Units total) by mouth every 7 (seven) days. (Patient not taking: Reported on 12/07/2015)   No  facility-administered encounter medications on file as of 12/07/2015.       Review of Systems  Constitutional: Negative.   HENT: Negative.   Eyes: Negative.   Respiratory: Negative.   Cardiovascular: Negative.   Gastrointestinal: Negative.   Endocrine: Negative.   Genitourinary: Negative.   Musculoskeletal: Negative.   Skin: Negative.   Allergic/Immunologic: Negative.   Neurological: Negative.   Hematological: Negative.   Psychiatric/Behavioral: Negative.        Objective:   Physical Exam  Constitutional: He is oriented to person, place, and time. He appears well-developed and well-nourished. No distress.  HENT:  Head: Normocephalic and atraumatic.  Left Ear: External ear normal.  Nose: Nose normal.  Mouth/Throat: Oropharynx is clear and moist. No oropharyngeal exudate.  Right ear cerumen  Eyes: Conjunctivae and EOM are normal. Pupils are equal, round, and reactive to light. Right eye exhibits no discharge. Left eye exhibits no discharge. No scleral icterus.  Neck: Normal range of motion. Neck supple. No thyromegaly present.  No bruits thyromegaly or anterior cervical adenopathy  Cardiovascular: Normal rate and intact distal pulses.   No murmur heard. Heart is slightly irregular with a rare PVC. The rhythm is 72/m  Pulmonary/Chest: Effort normal and breath sounds normal. No respiratory distress. He has no wheezes. He has no rales. He exhibits no tenderness.  Axillary is negative for adenopathy  Abdominal: Soft. Bowel sounds are normal. He exhibits no mass. There is no tenderness. There is no rebound  and no guarding.  The patient has abdominal obesity. There is no liver or spleen enlargement no epigastric tenderness no bruits and no masses present. He has good inguinal pulses. No inguinal adenopathy.  Musculoskeletal: Normal range of motion. He exhibits edema. He exhibits no tenderness.  Slight pedal edema  Lymphadenopathy:    He has no cervical adenopathy.  Neurological:  He is alert and oriented to person, place, and time. He has normal reflexes. No cranial nerve deficit.  Skin: Skin is warm and dry. No rash noted.  The patient has a history of cellulitis from venous insufficiency on both shins. Since he's been wearing support hose he does not have as much problem with this.  Psychiatric: He has a normal mood and affect. His behavior is normal. Judgment and thought content normal.  Nursing note and vitals reviewed.   BP (!) 147/87 (BP Location: Left Arm)   Pulse 62   Temp 97 F (36.1 C) (Oral)   Ht _0  (1.88 m)   Wt 269 lb (122 kg)   BMI 34.54 kg/m   Right ear will be irrigated before he leaves office.      Assessment & Plan:  1. Essential hypertension -The blood pressure slightly increased for the systolic today there will be no change in treatment. He will continue to watch his sodium intake. - BMP8+EGFR - CBC with Differential/Platelet - Hepatic function panel  2. Vitamin D deficiency -Continue current treatment pending results of lab work - CBC with Differential/Platelet - VITAMIN D 25 Hydroxy (Vit-D Deficiency, Fractures)  3. Pure hypercholesterolemia -Continue with omega-3 fatty acids and more aggressive diet efforts - Lipid panel - CBC with Differential/Platelet  4. Right ear cerumen -Irrigation to remove  5. Erectile dysfunction -Samples of Levitra 20 mg given to patient  6. Cellulitis of lower extremities -Has been greatly improved with his use of support stockings.  Patient Instructions                       Medicare Annual Wellness Visit  [facility] and the medical providers at [facility] strive to bring you the best medical care.  In doing so we not only want to address your current medical conditions and concerns but also to detect new conditions early and prevent illness, disease and health-related problems.    Medicare offers a yearly Wellness Visit which allows our clinical staff to assess  your need for preventative services including immunizations, lifestyle education, counseling to decrease risk of preventable diseases and screening for fall risk and other medical concerns.    This visit is provided free of charge (no copay) for all Medicare recipients. The clinical pharmacists at [facility] have begun to conduct these Wellness Visits which will also include a thorough review of all your medications.    As you primary medical provider recommend that you make an appointment for your Annual Wellness Visit if you have not done so already this year.  You may set up this appointment before you leave today or you may call back [phone]) and schedule an appointment.  Please make sure when you call that you mention that you are scheduling your Annual Wellness Visit with the clinical pharmacist so that the appointment may be made for the proper length of time.     Continue current medications. Continue good therapeutic lifestyle changes which include good diet and exercise. Fall precautions discussed with patient. If an FOBT was given today- please  return it to our front desk. If you are over 79 years old - you may need Prevnar 34 or the adult Pneumonia vaccine.  **Flu shots are available--- please call and schedule a FLU-CLINIC appointment**  After your visit with [place] today you will receive a survey in the mail or online from [employer] regarding your care with [place]. Please take a moment to fill this out. Your feedback is very important to [place] as you can help [place] better understand your patient needs as well as improve your experience and satisfaction. WE CARE ABOUT YOU!!!   Continue watch sodium intake Stay active physically and watch diet closely and lose as much weight as possible Don't put yourself at risk for falling and avoid climbing Flu shot that you received today may make your arm sore    Arrie Senate MD

## 2015-12-07 NOTE — Patient Instructions (Addendum)
Medicare Annual Wellness Visit  [facility] and the medical providers at [facility] strive to bring you the best medical care.  In doing so we not only want to address your current medical conditions and concerns but also to detect new conditions early and prevent illness, disease and health-related problems.    Medicare offers a yearly Wellness Visit which allows our clinical staff to assess your need for preventative services including immunizations, lifestyle education, counseling to decrease risk of preventable diseases and screening for fall risk and other medical concerns.    This visit is provided free of charge (no copay) for all Medicare recipients. The clinical pharmacists at [facility] have begun to conduct these Wellness Visits which will also include a thorough review of all your medications.    As you primary medical provider recommend that you make an appointment for your Annual Wellness Visit if you have not done so already this year.  You may set up this appointment before you leave today or you may call back [phone] and schedule an appointment.  Please make sure when you call that you mention that you are scheduling your Annual Wellness Visit with the clinical pharmacist so that the appointment may be made for the proper length of time.     Continue current medications. Continue good therapeutic lifestyle changes which include good diet and exercise. Fall precautions discussed with patient. If an FOBT was given today- please return it to our front desk. If you are over 41 years old - you may need Prevnar 28 or the adult Pneumonia vaccine.  **Flu shots are available--- please call and schedule a FLU-CLINIC appointment**  After your visit with [place] today you will receive a survey in the mail or online from [employer] regarding your care with [place]. Please take a moment to fill this out. Your feedback is very  important to [place] as you can help [place] better understand your patient needs as well as improve your experience and satisfaction. WE CARE ABOUT YOU!!!   Continue watch sodium intake Stay active physically and watch diet closely and lose as much weight as possible Don't put yourself at risk for falling and avoid climbing Flu shot that you received today may make your arm sore The patient was given samples of Levitra 20 mg today to try instead of Viagra.

## 2015-12-08 LAB — BMP8+EGFR
BUN/Creatinine Ratio: 15 (ref 10–24)
BUN: 14 mg/dL (ref 8–27)
CO2: 24 mmol/L (ref 18–29)
Calcium: 8.9 mg/dL (ref 8.6–10.2)
Chloride: 102 mmol/L (ref 96–106)
Creatinine, Ser: 0.95 mg/dL (ref 0.76–1.27)
GFR calc Af Amer: 85 mL/min/{1.73_m2} (ref >59)
GFR calc non Af Amer: 73 mL/min/{1.73_m2} (ref >59)
Glucose: 88 mg/dL (ref 65–99)
Potassium: 4.7 mmol/L (ref 3.5–5.2)
Sodium: 140 mmol/L (ref 134–144)

## 2015-12-08 LAB — CBC WITH DIFFERENTIAL/PLATELET
Basophils Absolute: 0 10*3/uL (ref 0.0–0.2)
Basos: 0 %
EOS (ABSOLUTE): 0.1 10*3/uL (ref 0.0–0.4)
Eos: 2 %
Hematocrit: 44 % (ref 37.5–51.0)
Hemoglobin: 15.2 g/dL (ref 12.6–17.7)
Immature Grans (Abs): 0 10*3/uL (ref 0.0–0.1)
Immature Granulocytes: 0 %
Lymphocytes Absolute: 2.4 10*3/uL (ref 0.7–3.1)
Lymphs: 36 %
MCH: 31.7 pg (ref 26.6–33.0)
MCHC: 34.5 g/dL (ref 31.5–35.7)
MCV: 92 fL (ref 79–97)
Monocytes Absolute: 0.8 10*3/uL (ref 0.1–0.9)
Monocytes: 11 %
Neutrophils Absolute: 3.5 10*3/uL (ref 1.4–7.0)
Neutrophils: 51 %
Platelets: 242 10*3/uL (ref 150–379)
RBC: 4.8 x10E6/uL (ref 4.14–5.80)
RDW: 12.7 % (ref 12.3–15.4)
WBC: 6.8 10*3/uL (ref 3.4–10.8)

## 2015-12-08 LAB — LIPID PANEL
Chol/HDL Ratio: 3.2 ratio units (ref 0.0–5.0)
Cholesterol, Total: 192 mg/dL (ref 100–199)
HDL: 60 mg/dL (ref >39)
LDL Calculated: 115 mg/dL — ABNORMAL HIGH (ref 0–99)
Triglycerides: 86 mg/dL (ref 0–149)
VLDL Cholesterol Cal: 17 mg/dL (ref 5–40)

## 2015-12-08 LAB — HEPATIC FUNCTION PANEL
ALT: 15 IU/L (ref 0–44)
AST: 20 IU/L (ref 0–40)
Albumin: 4 g/dL (ref 3.5–4.7)
Alkaline Phosphatase: 64 IU/L (ref 39–117)
Bilirubin Total: 0.4 mg/dL (ref 0.0–1.2)
Bilirubin, Direct: 0.15 mg/dL (ref 0.00–0.40)
Total Protein: 6.3 g/dL (ref 6.0–8.5)

## 2015-12-08 LAB — VITAMIN D 25 HYDROXY (VIT D DEFICIENCY, FRACTURES): Vit D, 25-Hydroxy: 32.6 ng/mL (ref 30.0–100.0)

## 2015-12-14 ENCOUNTER — Ambulatory Visit: Payer: Medicare Other

## 2015-12-15 ENCOUNTER — Encounter: Payer: Self-pay | Admitting: Family Medicine

## 2015-12-16 ENCOUNTER — Ambulatory Visit (INDEPENDENT_AMBULATORY_CARE_PROVIDER_SITE_OTHER): Payer: Medicare Other | Admitting: *Deleted

## 2015-12-16 ENCOUNTER — Encounter: Payer: Self-pay | Admitting: *Deleted

## 2015-12-16 VITALS — BP 146/82 | HR 69 | Ht 74.0 in | Wt 268.0 lb

## 2015-12-16 DIAGNOSIS — Z0000 Encounter for general adult medical examination without abnormal findings: Secondary | ICD-10-CM | POA: Diagnosis not present

## 2015-12-16 NOTE — Patient Instructions (Addendum)
   [name] , Thank you for taking time to come for your Medicare Wellness Visit. I appreciate your ongoing commitment to your health goals. Please review the following plan we discussed and let me know if I can assist you in the future.   These are the goals we discussed: Goals    . Exercise 150 minutes per week (moderate activity)          Continue to stay active.    . Prevent Falls          Move slowly and change positions slowly to avoid falls.       This is a list of the screening recommended for you and due dates:  Health Maintenance  Topic Date Due  . Shingles Vaccine  06/05/2016*  . Pneumonia vaccines (2 of 2 - PPSV23) 02/10/2016  . Tetanus Vaccine  05/07/2019  . Flu Shot  Completed  *Topic was postponed. The date shown is not the original due date.

## 2015-12-16 NOTE — Progress Notes (Addendum)
Subjective:   [name] is a 82 y.o. male who presents for a subsequent Medicare Annual Wellness Visit. He lives at home alone. He continues to farm and raise cattle. He has 2 sons and 1 daughter and some grandchildren as well.   Review of Systems  [name] reports that his health is about the same as last year.   Cardiac Risk Factors include: advanced age (>52men, >61 women);hypertension;male gender  Other systems negative.   Objective:    Today's Vitals   12/16/15 1100  BP: (!) 146/82  Pulse: 69  Weight: 268 lb (121.6 kg)  Height: 6\' 2"  (1.88 m)   Body mass index is 34.41 kg/m.  Current Medications (verified) Outpatient Encounter Prescriptions as of 12/16/2015  Medication Sig  . fish oil-omega-3 fatty acids 1000 MG capsule Take 2 g by mouth daily.    . furosemide (LASIX) 20 MG tablet Take 1 tablet (20 mg total) by mouth 2 (two) times daily.  Marland Kitchen lisinopril (PRINIVIL,ZESTRIL) 40 MG tablet Take 1 Tablet by mouth once daily  . Vitamin D, Ergocalciferol, (DRISDOL) 50000 units CAPS capsule Take 1 capsule (50,000 Units total) by mouth every 7 (seven) days. (Patient not taking: Reported on 12/07/2015)   No facility-administered encounter medications on file as of 12/16/2015.     Allergies (verified) Patient has no known allergies.   History: Past Medical History:  Diagnosis Date  . Hyperlipidemia   . Hypertension    Past Surgical History:  Procedure Laterality Date  . JOINT REPLACEMENT     Family History  Problem Relation Age of Onset  . Cancer Mother   . Stroke Father   . Dementia Brother   . Healthy Daughter   . Healthy Son   . Healthy Son    Social History   Occupational History  . Not on file.   Social History Main Topics  . Smoking status: Never Smoker  . Smokeless tobacco: Never Used  . Alcohol use No  . Drug use: No  . Sexual activity: No   Tobacco Counseling No tobacco use  Activities of Daily Living In your present state of health,  do you have any difficulty performing the following activities: 12/16/2015  Hearing? N  Vision? N  Difficulty concentrating or making decisions? N  Walking or climbing stairs? N  Dressing or bathing? N  Doing errands, shopping? N  Preparing Food and eating ? N  Using the Toilet? N  In the past six months, have you accidently leaked urine? N  Do you have problems with loss of bowel control? N  Managing your Medications? N  Managing your Finances? N  Housekeeping or managing your Housekeeping? N  Some recent data might be hidden    Immunizations and Health Maintenance Immunization History  Administered Date(s) Administered  . DTaP 06/02/2009  . Influenza Whole 12/06/2009  . Influenza, High Dose Seasonal PF 12/07/2015  . Influenza,inj,Quad PF,36+ Mos 01/02/2013, 01/06/2014, 01/10/2015  . Pneumococcal Conjugate-13 02/10/2015   There are no preventive care reminders to display for this patient.  Patient Care Team: Chipper Herb, MD as PCP - General (Family Medicine)      Assessment:   This is a routine wellness examination for [name]  Hearing/Vision screen No deficits noted during visit  Dietary issues and exercise activities discussed: Current Exercise Habits: The patient has a physically strenous job, but has no regular exercise apart from work., Exercise limited by: None identified Patient farms and moves around all day.   Typically  eats 2-3 meals per day. He is trying to decrease his carb and sodium intake.   Goals    . Exercise 150 minutes per week (moderate activity)          Continue to stay active.    . Prevent Falls          Move slowly and change positions slowly to avoid falls.      Depression Screen PHQ 2/9 Scores 12/16/2015 12/07/2015 07/26/2015 02/10/2015  PHQ - 2 Score 0 0 0 0    Fall Risk Fall Risk  12/16/2015 12/07/2015 07/26/2015 02/10/2015 06/28/2014  Falls in the past year? No No No No No    Cognitive Function: MMSE - Mini Mental State Exam  12/16/2015  Orientation to time 5  Orientation to Place 5  Registration 3  Attention/ Calculation 0  Recall 0  Language- name 2 objects 2  Language- repeat 1  Language- follow 3 step command 3  Language- read & follow direction 1  Write a sentence 1  Copy design 1  Total score 22   Memory seems within normal limits during conversation but exam did show some deficits.     Screening Tests Health Maintenance  Topic Date Due  . ZOSTAVAX  06/05/2016 (Originally 05/12/1991)  . PNA vac Low Risk Adult (2 of 2 - PPSV23) 02/10/2016  . TETANUS/TDAP  05/07/2019  . INFLUENZA VACCINE  Completed        Plan:   Keep f/u with Dr Laurance Flatten in 06/2016 Move carefully to avoid falls  During the course of the visit [name] was educated and counseled about the following appropriate screening and preventive services:   Vaccines to include Pneumoccal-up to date, Influenza-up to date,Td-up to date, Zostavax-postponed  Electrocardiogram-last done 06/2014  Cardiovascular disease screening-lipids screened routinely  Diabetes screening-with routine labs  Glaucoma screening-suggested annualy  Nutrition counseling-decrease NA and carbs  Exercise- continue to stay active  Patient Instructions (the written plan) were given to the patient.   Chong Sicilian, RN  12/16/2015    I have reviewed and agree with the above AWV documentation.   Wardell Honour MD

## 2016-03-06 DIAGNOSIS — L570 Actinic keratosis: Secondary | ICD-10-CM | POA: Diagnosis not present

## 2016-03-16 ENCOUNTER — Other Ambulatory Visit: Payer: Self-pay | Admitting: Family

## 2016-03-16 DIAGNOSIS — I10 Essential (primary) hypertension: Secondary | ICD-10-CM

## 2016-03-16 DIAGNOSIS — I878 Other specified disorders of veins: Secondary | ICD-10-CM

## 2016-04-24 IMAGING — CR DG CHEST 2V
2 series · 2 of 2 positions shown · non-contrast
Comparison: 01/03/2005

CLINICAL DATA: Hypertension

EXAM:
CHEST  2 VIEW

[view not recorded (1 of 2)]
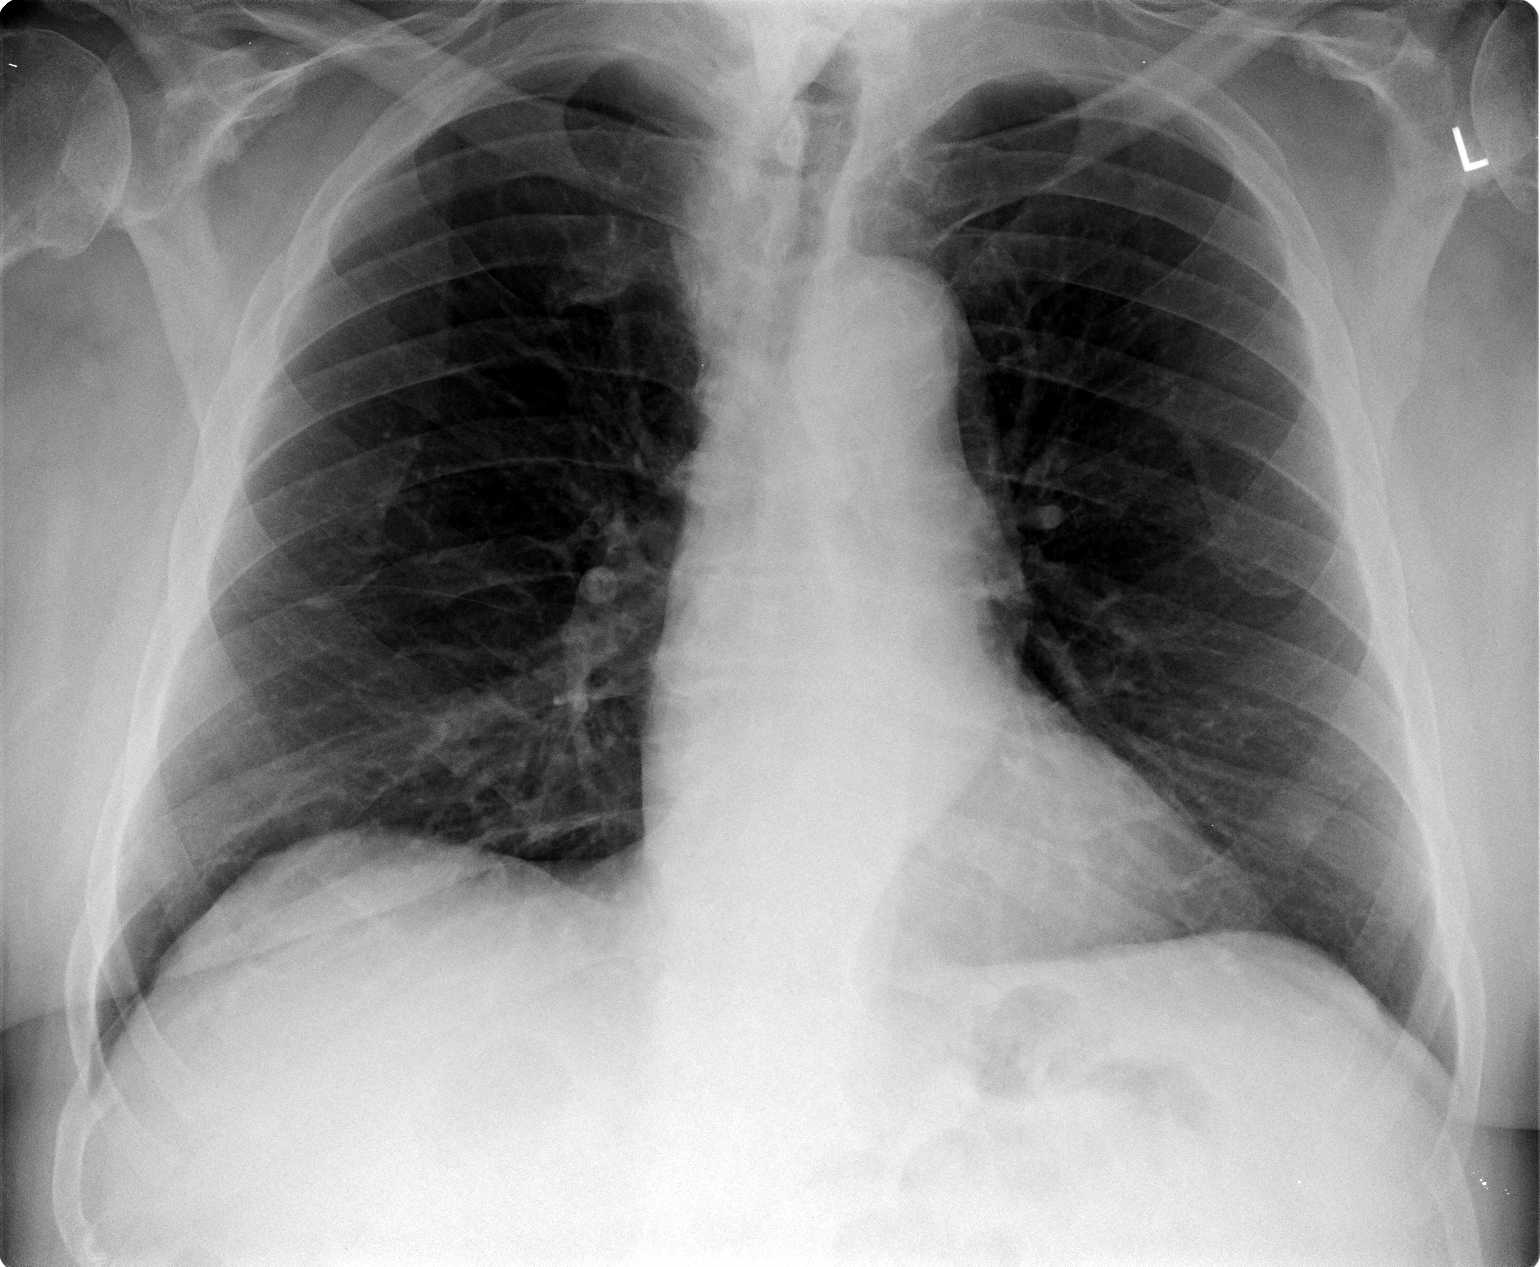

[view not recorded (2 of 2)]
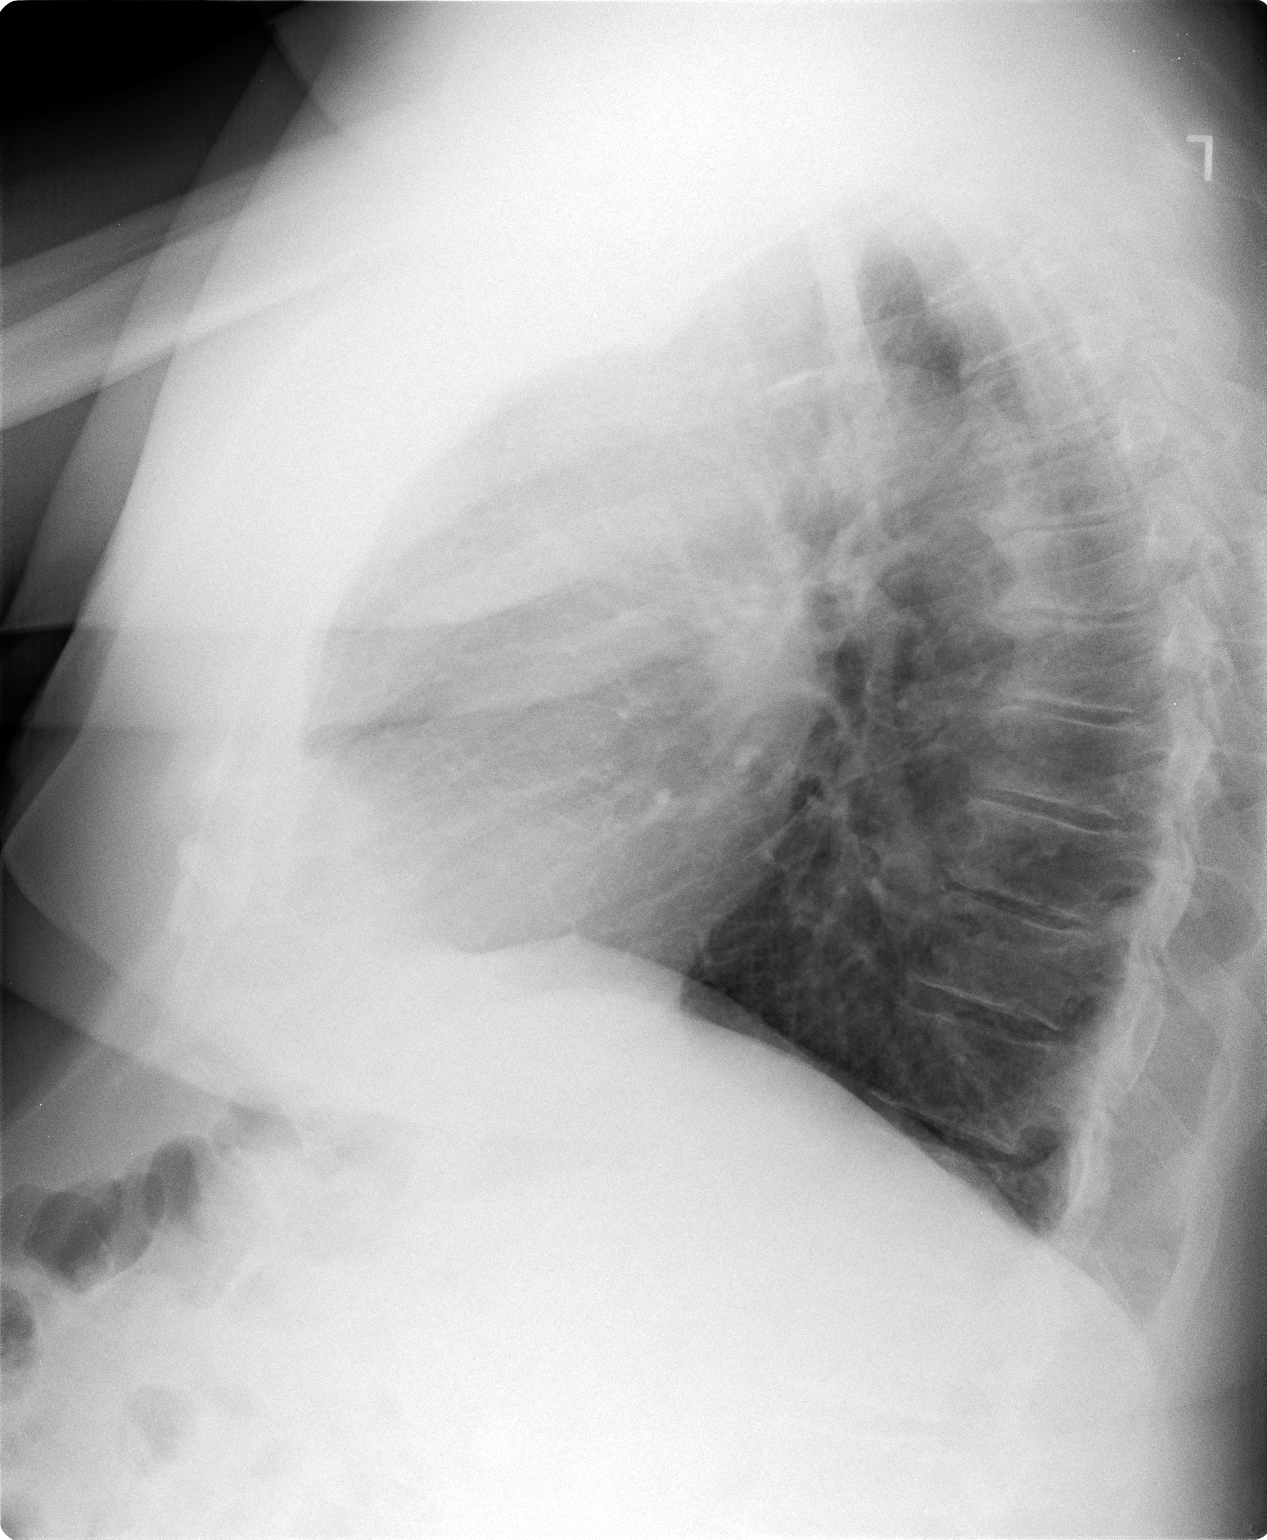

[2 of 2 positions shown; findings below may reference images not displayed]

FINDINGS: Normal heart size. Low volumes. Clear lungs. Increased AP diameter
of the chest no pleural effusion.
IMPRESSION: No active cardiopulmonary disease.

## 2016-05-11 ENCOUNTER — Other Ambulatory Visit: Payer: Self-pay | Admitting: Family Medicine

## 2016-05-24 ENCOUNTER — Other Ambulatory Visit: Payer: Self-pay | Admitting: *Deleted

## 2016-05-24 MED ORDER — CEPHALEXIN 500 MG PO CAPS: 500.0000 mg | ORAL_CAPSULE | Freq: Three times a day (TID) | ORAL | 1 refills | Status: DC

## 2016-05-28 ENCOUNTER — Ambulatory Visit (INDEPENDENT_AMBULATORY_CARE_PROVIDER_SITE_OTHER): Payer: Medicare Other | Admitting: Pediatrics

## 2016-05-28 ENCOUNTER — Encounter: Payer: Self-pay | Admitting: Pediatrics

## 2016-05-28 VITALS — BP 145/87 | HR 66 | Temp 98.9°F | Ht 74.0 in | Wt 273.4 lb

## 2016-05-28 DIAGNOSIS — R609 Edema, unspecified: Secondary | ICD-10-CM

## 2016-05-28 DIAGNOSIS — L03116 Cellulitis of left lower limb: Secondary | ICD-10-CM | POA: Diagnosis not present

## 2016-05-28 MED ORDER — DOXYCYCLINE HYCLATE 100 MG PO TABS: 100.0000 mg | ORAL_TABLET | Freq: Two times a day (BID) | ORAL | 0 refills | Status: DC

## 2016-05-28 NOTE — Patient Instructions (Signed)
Take 40mg  of lasix for next 3 days Keep Left leg elevated for next few days Any worsening symptoms, fevers, more pain in leg, we need to see you in clinic

## 2016-05-28 NOTE — Progress Notes (Signed)
  Subjective:   Patient ID: [name], male    DOB: [birth year], 82 y.o.   MRN: [id number] CC: Cellulitis (Left leg)  HPI: [name] is a 83 y.o. male presenting for Cellulitis (Left leg)  Started several days ago Was started on keflex over the weekend by PCP for concern for cellulitis Pt thinks it was looking better this morning, then was up on leg a lot today and redness, swelling returned Usually uses compression hose daily, hasnt for last for days One small area draining scant amount of light yellow clear fluid No fevers Appetite normal Still regularly doing work outside, including this morning  Relevant past medical, surgical, family and social history reviewed. Allergies and medications reviewed and updated. History  Smoking Status  . Never Smoker  Smokeless Tobacco  . Never Used   ROS: Per HPI   Objective:    BP (!) 145/87   Pulse 66   Temp 98.9 F (37.2 C) (Oral)   Ht 6\' 2"  (1.88 m)   Wt 273 lb 6.4 oz (124 kg)   BMI 35.10 kg/m   Wt Readings from Last 3 Encounters:  05/28/16 273 lb 6.4 oz (124 kg)  12/16/15 268 lb (121.6 kg)  12/07/15 269 lb (122 kg)    Gen: NAD, alert, cooperative with exam, NCAT EYES: EOMI, no conjunctival injection, or no icterus CV: NRRR, normal S1/S2, no murmur, distal pulses 2+ b/l Resp: CTABL, no wheezes, normal WOB Ext: 1+ pitting edema R leg, 2+ L leg.  Neuro: Alert and oriented Skin: L lower shin with apprx 10-15cm of red, warmth. One small < 17mm break in skin with slight yellow to clear drainage  Assessment & Plan:  Joaquin was seen today for cellulitis.  Diagnoses and all orders for this visit:  Cellulitis of left lower extremity Stop keflex, was improving this morning, thinks about the same now Start below Discussed keeping leg propped up, increasing lasix as below to help with swelling Any worsening needs to be seen -     doxycycline (VIBRA-TABS) 100 MG tablet; Take 1 tablet (100 mg total) by mouth 2 (two) times  daily.  Swelling Take 40mg  qd of lasix for 3 days Wear compression hose every day  Follow up plan: Return in about 1 week (around 06/04/2016) for recheck cellulitis. Assunta Found, MD [place]

## 2016-05-30 ENCOUNTER — Encounter: Payer: Self-pay | Admitting: Family Medicine

## 2016-05-30 ENCOUNTER — Ambulatory Visit (INDEPENDENT_AMBULATORY_CARE_PROVIDER_SITE_OTHER): Payer: Medicare Other | Admitting: Family Medicine

## 2016-05-30 ENCOUNTER — Other Ambulatory Visit: Payer: Self-pay | Admitting: Family Medicine

## 2016-05-30 VITALS — BP 129/75 | HR 73 | Temp 97.4°F | Ht 74.0 in | Wt 268.2 lb

## 2016-05-30 DIAGNOSIS — L03116 Cellulitis of left lower limb: Secondary | ICD-10-CM

## 2016-05-30 MED ORDER — TRIAMCINOLONE ACETONIDE 0.5 % EX OINT: 1.0000 "application " | TOPICAL_OINTMENT | Freq: Two times a day (BID) | CUTANEOUS | 0 refills | Status: DC

## 2016-05-30 MED ORDER — SULFAMETHOXAZOLE-TRIMETHOPRIM 800-160 MG PO TABS: 1.0000 | ORAL_TABLET | Freq: Two times a day (BID) | ORAL | 0 refills | Status: DC

## 2016-05-30 NOTE — Patient Instructions (Signed)
Great to meet you!  Elevate and rest your leg for 2-3 days  Take bactrim 2 times daily, continue keflex too.   Start kenalog ointment to the leg to calm down inflammation.   Please come back if you develop fever, chills, sweats, or worsening symptoms.

## 2016-05-30 NOTE — Progress Notes (Signed)
   HPI  Patient presents today here with leg cellulitis.  Patient states that he usually struggles with this leg cellulitis in the summer. He was called in [facility] earlier this week by his PCP. He was seen by another provider in the practice and treated with doxycycline earlier this week.  Patient took the doxycycline for about 36 hours and did not feel that it was helping. He has now returned for follow-up. He restarted taking Keflex this morning  He states that in the morning the rash is better. By the afternoon is red, warm, and painful. He does wear compression stockings which are present today.  He denies fever, chills, sweats, malaise, or feeling poorly except for leg pain.  PMH: Smoking status noted ROS: Per HPI  Objective: BP 129/75   Pulse 73   Temp 97.4 F (36.3 C) (Oral)   Ht 6\' 2"  (1.88 m)   Wt 268 lb 3.2 oz (121.7 kg)   BMI 34.43 kg/m  Gen: NAD, alert, cooperative with exam HEENT: NCAT,  CV: RRR Resp: CTABL, no wheezes, non-labored Ext: No edema, warm Neuro: Alert and oriented, No gross deficits    Left lower extremity warm, erythematous, no areas of overt drainage, however he is slightly moist throughout.   Assessment and plan:  # Left leg cellulitis Patient quickly returning after taking approximately 3 doses of doxycycline. Patient has previously done well with Keflex, however it sounds like he takes 1 or 2 pills on occasion, increasing his risk for resistance. Discontinue doxycycline, start Bactrim. Continue Keflex for good strep coverage and low likelihood of strep resistance. Consider venous stasis dermatitis, added Kenalog ointment twice daily. ( Biggest evidence for this being improvement every morning.) Recommended rest and elevation of the leg for 2-3 days until he improves. Recommended very low threshold for follow-up for seeking additional medical care if he develops worsening rash, pain, or especially if he develops fevers.  Considering how few  doses of doxycycline he took I do not consider this outpatient failure. He does have a red warm leg, however he feels very well and has no signs of sepsis so I believe another course of oral antibiotics is appropriate.  Meds ordered this encounter  Medications  . cephALEXin (KEFLEX) 500 MG capsule    Sig: Take 500 mg by mouth 3 (three) times daily.    Refill:  1  . sulfamethoxazole-trimethoprim (BACTRIM DS) 800-160 MG tablet    Sig: Take 1 tablet by mouth 2 (two) times daily.    Dispense:  14 tablet    Refill:  0  . triamcinolone ointment (KENALOG) 0.5 %    Sig: Apply 1 application topically 2 (two) times daily.    Dispense:  30 g    Refill:  Schuyler, MD [facility] 05/30/2016, 3:52 PM

## 2016-06-06 ENCOUNTER — Ambulatory Visit: Payer: Medicare Other | Admitting: Family Medicine

## 2016-06-07 ENCOUNTER — Other Ambulatory Visit: Payer: Self-pay | Admitting: Family Medicine

## 2016-06-07 ENCOUNTER — Encounter: Payer: Self-pay | Admitting: Family Medicine

## 2016-06-07 ENCOUNTER — Telehealth: Payer: Self-pay | Admitting: Family Medicine

## 2016-06-07 NOTE — Telephone Encounter (Signed)
Needs appointment to be seen

## 2016-06-07 NOTE — Telephone Encounter (Signed)
Spoke with pt = rescheduled

## 2016-06-08 ENCOUNTER — Encounter: Payer: Self-pay | Admitting: Family

## 2016-06-08 ENCOUNTER — Ambulatory Visit (INDEPENDENT_AMBULATORY_CARE_PROVIDER_SITE_OTHER): Payer: Medicare Other | Admitting: Family

## 2016-06-08 VITALS — BP 138/84 | HR 67 | Temp 97.0°F | Ht 74.0 in | Wt 266.8 lb

## 2016-06-08 DIAGNOSIS — L03116 Cellulitis of left lower limb: Secondary | ICD-10-CM

## 2016-06-08 NOTE — Patient Instructions (Signed)
Cellulitis, Adult Cellulitis is a skin infection. The infected area is usually red and sore. This condition occurs most often in the arms and lower legs. It is very important to get treated for this condition. Follow these instructions at home:  Take over-the-counter and prescription medicines only as told by your doctor.  If you were prescribed an antibiotic medicine, take it as told by your doctor. Do not stop taking the antibiotic even if you start to feel better.  Drink enough fluid to keep your pee (urine) clear or pale yellow.  Do not touch or rub the infected area.  Raise (elevate) the infected area above the level of your heart while you are sitting or lying down.  Place warm or cold wet cloths (warm or cold compresses) on the infected area. Do this as told by your doctor.  Keep all follow-up visits as told by your doctor. This is important. These visits let your doctor make sure your infection is not getting worse. Contact a doctor if:  You have a fever.  Your symptoms do not get better after 1-2 days of treatment.  Your bone or joint under the infected area starts to hurt after the skin has healed.  Your infection comes back. This can happen in the same area or another area.  You have a swollen bump in the infected area.  You have new symptoms.  You feel ill and also have muscle aches and pains. Get help right away if:  Your symptoms get worse.  You feel very sleepy.  You throw up (vomit) or have watery poop (diarrhea) for a long time.  There are red streaks coming from the infected area.  Your red area gets larger.  Your red area turns darker. This information is not intended to replace advice given to you by your health care provider. Make sure you discuss any questions you have with your health care provider. Document Released: 07/11/2007 Document Revised: 06/30/2015 Document Reviewed: 12/01/2014 Elsevier Interactive Patient Education  2017 Elsevier  Inc[employer]  

## 2016-06-08 NOTE — Progress Notes (Signed)
   Subjective:    Patient ID: [name], male    DOB: 1929/07/22, 83 y.o.   MRN: [id number]  HPI Pt presents to the office today to recheck cellulitis of left leg. Pt was seen on 05/28/16 and 05/30/16. Pt was given doxycycline, but pt only took for a few days and stopped. PT was given bactrim on 25th appt and told to continue his keflex rx.   PT reports that he leg and pain has improved, but has "run out of antibiotic" and is worried that it is not "cleared up". Pt is using compression stocking every day and keeping his feet elevated "when I can".   Review of Systems  Cardiovascular: Positive for leg swelling.  Skin: Positive for wound.  All other systems reviewed and are negative.      Objective:   Physical Exam  Constitutional: He is oriented to person, place, and time. He appears well-developed and well-nourished. No distress.  Cardiovascular: Normal rate, regular rhythm, normal heart sounds and intact distal pulses.   No murmur heard. Pulmonary/Chest: Effort normal and breath sounds normal. No respiratory distress. He has no wheezes.  Abdominal: Soft. Bowel sounds are normal. He exhibits no distension. There is no tenderness.  Musculoskeletal: Normal range of motion. He exhibits edema. He exhibits no tenderness.  Erythemas of left lower low, tender present and 2+ edema, that extends around lower leg,   Neurological: He is alert and oriented to person, place, and time.  Skin: Skin is warm and dry. No rash noted. No erythema.  Psychiatric: He has a normal mood and affect. His behavior is normal. Judgment and thought content normal.  Vitals reviewed.     BP 138/84   Pulse 67   Temp 97 F (36.1 C) (Oral)   Ht 6\' 2"  (1.88 m)   Wt 266 lb 12.8 oz (121 kg)   BMI 34.26 kg/m      Assessment & Plan:  1. Cellulitis of left lower extremity Will hold off on antibiotics at this time. Area marked and pt told to call if erythemas or tenderness increases Keep elevated Continue  compression hose Bactrim as needed PT told I was on call this weekend and told to call if any changes!! RTO prn and keep PCP appt   Evelina Dun, FNP

## 2016-06-08 NOTE — Telephone Encounter (Signed)
Patient aware.

## 2016-06-14 ENCOUNTER — Ambulatory Visit (INDEPENDENT_AMBULATORY_CARE_PROVIDER_SITE_OTHER): Payer: Medicare Other | Admitting: Family Medicine

## 2016-06-14 ENCOUNTER — Encounter: Payer: Self-pay | Admitting: Family Medicine

## 2016-06-14 VITALS — BP 126/76 | HR 71 | Temp 97.3°F | Ht 74.0 in | Wt 266.4 lb

## 2016-06-14 DIAGNOSIS — I10 Essential (primary) hypertension: Secondary | ICD-10-CM | POA: Diagnosis not present

## 2016-06-14 DIAGNOSIS — N400 Enlarged prostate without lower urinary tract symptoms: Secondary | ICD-10-CM

## 2016-06-14 DIAGNOSIS — L03116 Cellulitis of left lower limb: Secondary | ICD-10-CM | POA: Diagnosis not present

## 2016-06-14 DIAGNOSIS — E7800 Pure hypercholesterolemia, unspecified: Secondary | ICD-10-CM

## 2016-06-14 DIAGNOSIS — E559 Vitamin D deficiency, unspecified: Secondary | ICD-10-CM

## 2016-06-14 DIAGNOSIS — R609 Edema, unspecified: Secondary | ICD-10-CM

## 2016-06-14 MED ORDER — CEPHALEXIN 500 MG PO CAPS: 500.0000 mg | ORAL_CAPSULE | Freq: Three times a day (TID) | ORAL | 0 refills | Status: DC

## 2016-06-14 NOTE — Progress Notes (Signed)
Subjective:    Patient ID: [name], male    DOB: [birth year], 83 y.o.   MRN: [id number]  HPI  Pt is here for chronic medical conditions which include hypertension, hyperlipidemia and Vit D deficiency. He also is having some swelling of the L lower leg.The patient has had more trouble recently with cellulitis in his lower legs secondary to edema. He is currently taking furosemide twice daily and is supposed be using support hose. He takes lisinopril for his blood pressure and is on vitamin D replacement. The patient is pleasant and alert. He says his legs are better. He has this chronic ongoing cellulitis with the left being worse than the right. He denies any chest pain shortness of breath trouble swallowing heartburn indigestion nausea vomiting diarrhea or blood in the stool. He says he's passing his water without problems. He is not taking any antibiotics currently. He's never had any Doppler studies of his legs.    Patient Active Problem List   Diagnosis Date Noted  . Essential hypertension 08/16/2014  . Hyperlipemia 08/16/2014  . Peripheral edema 06/10/2012  . Hypertension 05/10/2010  . Vitamin D deficiency 05/10/2010  . Arthritis 05/10/2010   Outpatient Encounter Prescriptions as of 06/14/2016  Medication Sig  . fish oil-omega-3 fatty acids 1000 MG capsule Take 2 g by mouth daily.    . furosemide (LASIX) 20 MG tablet TAKE ONE TABLET BY MOUTH TWICE DAILY  . lisinopril (PRINIVIL,ZESTRIL) 40 MG tablet Take 1 Tablet by mouth once daily  . triamcinolone ointment (KENALOG) 0.5 % Apply 1 application topically 2 (two) times daily.  . Vitamin D, Ergocalciferol, (DRISDOL) 50000 units CAPS capsule TAKE ONE CAPSULE BY MOUTH every SEVEN DAYS   No facility-administered encounter medications on file as of 06/14/2016.        Review of Systems  Constitutional: Negative.   HENT: Negative.   Respiratory: Negative.   Cardiovascular: Negative.   Gastrointestinal: Negative.     Musculoskeletal: Negative.   Neurological: Negative.   Psychiatric/Behavioral: Negative.        Objective:   Physical Exam  Constitutional: He is oriented to person, place, and time. He appears well-developed and well-nourished. No distress.  The patient is pleasant and alert and in good spirits  HENT:  Head: Normocephalic and atraumatic.  Right Ear: External ear normal.  Left Ear: External ear normal.  Nose: Nose normal.  Mouth/Throat: Oropharynx is clear and moist. No oropharyngeal exudate.  Eyes: Conjunctivae and EOM are normal. Pupils are equal, round, and reactive to light. Right eye exhibits no discharge. Left eye exhibits no discharge. No scleral icterus.  Neck: Normal range of motion. Neck supple. No thyromegaly present.  No bruits thyromegaly or anterior cervical adenopathy  Cardiovascular: Normal rate, regular rhythm, normal heart sounds and intact distal pulses.   No murmur heard. Distal pulses were present but weak The heart has a regular rate and rhythm at 72/m  Pulmonary/Chest: Effort normal and breath sounds normal. No respiratory distress. He has no wheezes. He has no rales. He exhibits no tenderness.  Clear anteriorly and posteriorly and no axillary adenopathy  Abdominal: Soft. Bowel sounds are normal. He exhibits no mass. There is no tenderness. There is no rebound and no guarding.  Abdominal obesity without liver or spleen enlargement masses bruits or inguinal adenopathy  Genitourinary: Rectum normal and penis normal.  Genitourinary Comments: The prostate was slightly enlarged without any lumps or masses. There were no rectal masses. The external genitalia were within normal limits and no  inguinal hernias were palpated.  Musculoskeletal: Normal range of motion. He exhibits edema. He exhibits no tenderness.  Lymphadenopathy:    He has no cervical adenopathy.  Neurological: He is alert and oriented to person, place, and time. He has normal reflexes. No cranial nerve  deficit.  Skin: Skin is warm and dry. Rash noted. There is erythema. No pallor.  There is swelling of both lower extremities with the left being worse than the right with rubor and redness.  Psychiatric: He has a normal mood and affect. His behavior is normal. Judgment and thought content normal.  Nursing note and vitals reviewed.   BP 126/76 (BP Location: Left Arm, Patient Position: Sitting, Cuff Size: Large)   Pulse 71   Temp 97.3 F (36.3 C) (Oral)   Ht 6\' 2"  (1.88 m)   Wt 266 lb 6.4 oz (120.8 kg)   BMI 34.20 kg/m          Assessment & Plan:  1. Swelling -Continue with Lasix twice daily. 20 mg.  2. Left leg cellulitis -Take cephalexin 500 mg 3 times a day for 10 days and take Lasix twice daily on a regular basis  3. Essential hypertension -Continue with current medication  4. Vitamin D deficiency -Continue with vitamin D replacement which is 50,000 units weekly  5. Pure hypercholesterolemia -Continue with aggressive therapeutic lifestyle changes and omega-3 fatty acids  6. Benign prostatic hyperplasia without lower urinary tract symptoms -The patient is having no symptoms with this and no PSA was done because of his age.  No orders of the defined types were placed in this encounter.  Patient Instructions  Continue current medications. Continue good therapeutic lifestyle changes which include good diet and exercise. Fall precautions discussed with patient. If an FOBT was given today- please return it to our front desk. If you are over 72 years old - you may need Prevnar 71 or the adult Pneumonia vaccine.  **Flu shots are available--- please call and schedule a FLU-CLINIC appointment**  After your visit with [place] today you will receive a survey in the mail or online from [employer] regarding your care with [place]. Please take a moment to fill this out. Your feedback is very important to [place] as you can help [place] better understand your patient needs as well as improve your  experience and satisfaction. WE CARE ABOUT YOU!!!   We will arrange for you to have venous Dopplers of both lower extremities to make sure that you do not have a deep vein thrombosis or blood clot. Continue to wear the support hose and put these on the first thing with getting up in the morning Take your Lasix twice daily as directed Take antibiotic until completed  Arrie Senate MD

## 2016-06-14 NOTE — Addendum Note (Signed)
Addended by: Marin Olp on: 06/14/2016 02:33 PM   Modules accepted: Orders

## 2016-06-14 NOTE — Patient Instructions (Addendum)
Continue current medications. Continue good therapeutic lifestyle changes which include good diet and exercise. Fall precautions discussed with patient. If an FOBT was given today- please return it to our front desk. If you are over 81 years old - you may need Prevnar 21 or the adult Pneumonia vaccine.  **Flu shots are available--- please call and schedule a FLU-CLINIC appointment**  After your visit with [place] today you will receive a survey in the mail or online from [employer] regarding your care with [place]. Please take a moment to fill this out. Your feedback is very important to [place] as you can help [place] better understand your patient needs as well as improve your experience and satisfaction. WE CARE ABOUT YOU!!!   We will arrange for you to have venous Dopplers of both lower extremities to make sure that you do not have a deep vein thrombosis or blood clot. Continue to wear the support hose and put these on the first thing with getting up in the morning Take your Lasix twice daily as directed Take antibiotic until completed

## 2016-06-15 LAB — LIPID PANEL
Chol/HDL Ratio: 3.3 ratio (ref 0.0–5.0)
Cholesterol, Total: 191 mg/dL (ref 100–199)
HDL: 58 mg/dL (ref >39)
LDL Calculated: 115 mg/dL — ABNORMAL HIGH (ref 0–99)
Triglycerides: 91 mg/dL (ref 0–149)
VLDL Cholesterol Cal: 18 mg/dL (ref 5–40)

## 2016-06-15 LAB — BMP8+EGFR
BUN/Creatinine Ratio: 13 (ref 10–24)
BUN: 15 mg/dL (ref 8–27)
CO2: 22 mmol/L (ref 18–29)
Calcium: 9.2 mg/dL (ref 8.6–10.2)
Chloride: 101 mmol/L (ref 96–106)
Creatinine, Ser: 1.13 mg/dL (ref 0.76–1.27)
GFR calc Af Amer: 68 mL/min/{1.73_m2} (ref >59)
GFR calc non Af Amer: 59 mL/min/{1.73_m2} — ABNORMAL LOW (ref >59)
Glucose: 89 mg/dL (ref 65–99)
Potassium: 5 mmol/L (ref 3.5–5.2)
Sodium: 140 mmol/L (ref 134–144)

## 2016-06-15 LAB — CBC WITH DIFFERENTIAL/PLATELET
Basophils Absolute: 0 10*3/uL (ref 0.0–0.2)
Basos: 0 %
EOS (ABSOLUTE): 0.1 10*3/uL (ref 0.0–0.4)
Eos: 1 %
Hematocrit: 45.4 % (ref 37.5–51.0)
Hemoglobin: 15.3 g/dL (ref 13.0–17.7)
Immature Grans (Abs): 0 10*3/uL (ref 0.0–0.1)
Immature Granulocytes: 0 %
Lymphocytes Absolute: 2.6 10*3/uL (ref 0.7–3.1)
Lymphs: 31 %
MCH: 31.1 pg (ref 26.6–33.0)
MCHC: 33.7 g/dL (ref 31.5–35.7)
MCV: 92 fL (ref 79–97)
Monocytes Absolute: 0.9 10*3/uL (ref 0.1–0.9)
Monocytes: 11 %
Neutrophils Absolute: 4.7 10*3/uL (ref 1.4–7.0)
Neutrophils: 57 %
Platelets: 288 10*3/uL (ref 150–379)
RBC: 4.92 x10E6/uL (ref 4.14–5.80)
RDW: 13 % (ref 12.3–15.4)
WBC: 8.3 10*3/uL (ref 3.4–10.8)

## 2016-06-15 LAB — HEPATIC FUNCTION PANEL
ALT: 14 IU/L (ref 0–44)
AST: 21 IU/L (ref 0–40)
Albumin: 4.1 g/dL (ref 3.5–4.7)
Alkaline Phosphatase: 69 IU/L (ref 39–117)
Bilirubin Total: 0.5 mg/dL (ref 0.0–1.2)
Bilirubin, Direct: 0.13 mg/dL (ref 0.00–0.40)
Total Protein: 6.7 g/dL (ref 6.0–8.5)

## 2016-06-26 ENCOUNTER — Ambulatory Visit: Payer: Medicare Other | Admitting: Family Medicine

## 2016-06-28 ENCOUNTER — Ambulatory Visit (INDEPENDENT_AMBULATORY_CARE_PROVIDER_SITE_OTHER): Payer: Medicare Other | Admitting: Family Medicine

## 2016-06-28 ENCOUNTER — Encounter: Payer: Self-pay | Admitting: Family Medicine

## 2016-06-28 VITALS — BP 134/79 | HR 62 | Temp 97.2°F | Ht 74.0 in | Wt 268.0 lb

## 2016-06-28 DIAGNOSIS — L03116 Cellulitis of left lower limb: Secondary | ICD-10-CM

## 2016-06-28 DIAGNOSIS — I10 Essential (primary) hypertension: Secondary | ICD-10-CM | POA: Diagnosis not present

## 2016-06-28 NOTE — Progress Notes (Signed)
Subjective:    Patient ID: [name], male    DOB: [birth year], 83 y.o.   MRN: [id number]  HPI  Patient here today for recheck of left lower leg cellulitis.The patient says his leg is much better. This is wearing support hose. He is still taking cephalexin. Sometimes he wears the support hose all night and sometimes he does not wear it at night and puts it on the first thing in the morning. He denies any other symptoms including chest pain shortness of breath GI symptoms or voiding issues. He says his leg feels much better.     Patient Active Problem List   Diagnosis Date Noted  . Essential hypertension 08/16/2014  . Hyperlipemia 08/16/2014  . Peripheral edema 06/10/2012  . Hypertension 05/10/2010  . Vitamin D deficiency 05/10/2010  . Arthritis 05/10/2010   Outpatient Encounter Prescriptions as of 06/28/2016  Medication Sig  . cephALEXin (KEFLEX) 500 MG capsule Take 1 capsule (500 mg total) by mouth 3 (three) times daily.  . fish oil-omega-3 fatty acids 1000 MG capsule Take 2 g by mouth daily.    . furosemide (LASIX) 20 MG tablet TAKE ONE TABLET BY MOUTH TWICE DAILY  . lisinopril (PRINIVIL,ZESTRIL) 40 MG tablet Take 1 Tablet by mouth once daily  . triamcinolone ointment (KENALOG) 0.5 % Apply 1 application topically 2 (two) times daily.  . Vitamin D, Ergocalciferol, (DRISDOL) 50000 units CAPS capsule TAKE ONE CAPSULE BY MOUTH every SEVEN DAYS   No facility-administered encounter medications on file as of 06/28/2016.      Review of Systems  Constitutional: Negative.   HENT: Negative.   Eyes: Negative.   Respiratory: Negative.   Cardiovascular: Negative.   Gastrointestinal: Negative.   Endocrine: Negative.   Genitourinary: Negative.   Musculoskeletal: Negative.   Skin: Negative.        Cellulitis- better -- left lower leg  Allergic/Immunologic: Negative.   Neurological: Negative.   Hematological: Negative.   Psychiatric/Behavioral: Negative.        Objective:   Physical Exam  Constitutional: He is oriented to person, place, and time. He appears well-developed and well-nourished. No distress.  Cardiovascular: Normal rate, regular rhythm and normal heart sounds.   No murmur heard. Pulmonary/Chest: Effort normal and breath sounds normal. No respiratory distress. He has no wheezes. He has no rales.  Abdominal: Soft. He exhibits no mass. There is no tenderness. There is no rebound and no guarding.  Musculoskeletal: Normal range of motion. He exhibits edema. He exhibits no tenderness.  There is minimal swelling of the skin of the left lower extremity and slight warmth and definite redness from the resolving cellulitis.  Neurological: He is alert and oriented to person, place, and time.  Skin: Skin is warm and dry. No rash noted. There is erythema.  Psychiatric: He has a normal mood and affect. His behavior is normal. Judgment and thought content normal.  Nursing note and vitals reviewed.  BP 134/79 (BP Location: Left Arm)   Pulse 62   Temp 97.2 F (36.2 C) (Oral)   Ht 6\' 2"  (1.88 m)   Wt 268 lb (121.6 kg)   BMI 34.41 kg/m         Assessment & Plan:  1. Essential hypertension -The blood pressure is good today and he will continue with current treatment  2. Left leg cellulitis -This is improved with the support hose that he is wearing. We would ask him to continue to use the support hose and just to get another opinion  about the ongoing issues with his vascular insufficiency and cellulitis we will schedule a visit with a vascular surgeon to further evaluate and make any recommendations for what we need to do to keep this under the best control. -Patient agrees to go see vascular surgeon.  Patient Instructions  Continue wearing support hose and elevate leg as much as possible specimen sitting in the house We will arrange for you to have an appointment with the vascular surgeon to make sure there is nothing else that we need to do other than what  we are currently doing.  Arrie Senate MD

## 2016-06-28 NOTE — Addendum Note (Signed)
Addended by: Zannie Cove on: 06/28/2016 02:34 PM   Modules accepted: Orders

## 2016-06-28 NOTE — Patient Instructions (Signed)
Continue wearing support hose and elevate leg as much as possible specimen sitting in the house We will arrange for you to have an appointment with the vascular surgeon to make sure there is nothing else that we need to do other than what we are currently doing.

## 2016-07-12 ENCOUNTER — Encounter: Payer: Self-pay | Admitting: Surgery

## 2016-07-18 ENCOUNTER — Ambulatory Visit (INDEPENDENT_AMBULATORY_CARE_PROVIDER_SITE_OTHER): Payer: Medicare Other | Admitting: Surgery

## 2016-07-18 ENCOUNTER — Encounter: Payer: Self-pay | Admitting: Surgery

## 2016-07-18 VITALS — BP 164/88 | HR 67 | Temp 97.6°F | Resp 20 | Ht 74.0 in | Wt 270.0 lb

## 2016-07-18 DIAGNOSIS — I872 Venous insufficiency (chronic) (peripheral): Secondary | ICD-10-CM | POA: Diagnosis not present

## 2016-07-18 NOTE — Progress Notes (Signed)
Vascular and Vein Specialist of [facility]  Patient name: [name] MRN: [id number] DOB: May 31, 1929 Sex: male   REQUESTING PROVIDER:    Leg swelling and cellulitis   REASON FOR CONSULT:    Dr. Laurance Flatten  HISTORY OF PRESENT ILLNESS:   [name] is a 83 y.o. male, who is Referred today for evaluation of bilateral leg swelling as well as cellulitis.  He is the father-in-law of Arnold Long.  He has been dealing with swelling for a long time.  He has been treated with antibiotics for cellulitis.  This has been getting better.  He is wearing a light compression stocking which he states has helped his swelling.  His swelling is better in the morning.  It does improve with leg elevation.  He does not have any open wounds at this time.  Patient suffers from hypertension, which is managed with an ACE inhibitor.  He is a nonsmoker.  He does not have a history of DVT.  PAST MEDICAL HISTORY    Past Medical History:  Diagnosis Date  . Hyperlipidemia   . Hypertension      FAMILY HISTORY   Family History  Problem Relation Age of Onset  . Cancer Mother   . Stroke Father   . Dementia Brother   . Healthy Daughter   . Healthy Son   . Healthy Son     SOCIAL HISTORY:   Social History   Social History  . Marital status: Single    Spouse name: N/A  . Number of children: N/A  . Years of education: N/A   Occupational History  . Not on file.   Social History Main Topics  . Smoking status: Never Smoker  . Smokeless tobacco: Never Used  . Alcohol use No  . Drug use: No  . Sexual activity: No   Other Topics Concern  . Not on file   Social History Narrative  . No narrative on file    ALLERGIES:    No Known Allergies  CURRENT MEDICATIONS:    Current Outpatient Prescriptions  Medication Sig Dispense Refill  . cephALEXin (KEFLEX) 500 MG capsule Take 1 capsule (500 mg total) by mouth 3 (three) times daily. 30 capsule 0  . fish  oil-omega-3 fatty acids 1000 MG capsule Take 2 g by mouth daily.      . furosemide (LASIX) 20 MG tablet TAKE ONE TABLET BY MOUTH TWICE DAILY 180 tablet 0  . lisinopril (PRINIVIL,ZESTRIL) 40 MG tablet Take 1 Tablet by mouth once daily 90 tablet 0  . triamcinolone ointment (KENALOG) 0.5 % Apply 1 application topically 2 (two) times daily. 30 g 0  . Vitamin D, Ergocalciferol, (DRISDOL) 50000 units CAPS capsule TAKE ONE CAPSULE BY MOUTH every SEVEN DAYS 12 capsule 2   No current facility-administered medications for this visit.     REVIEW OF SYSTEMS:   [X]  denotes positive finding, [ ]  denotes negative finding Cardiac  Comments:  Chest pain or chest pressure:    Shortness of breath upon exertion:    Short of breath when lying flat:    Irregular heart rhythm:        Vascular    Pain in calf, thigh, or hip brought on by ambulation:    Pain in feet at night that wakes you up from your sleep:     Blood clot in your veins:    Leg swelling:  x       Pulmonary    Oxygen at home:  Productive cough:     Wheezing:         Neurologic    Sudden weakness in arms or legs:     Sudden numbness in arms or legs:     Sudden onset of difficulty speaking or slurred speech:    Temporary loss of vision in one eye:     Problems with dizziness:         Gastrointestinal    Blood in stool:      Vomited blood:         Genitourinary    Burning when urinating:     Blood in urine:        Psychiatric    Major depression:         Hematologic    Bleeding problems:    Problems with blood clotting too easily:        Skin    Rashes or ulcers:        Constitutional    Fever or chills:     PHYSICAL EXAM:   Vitals:   07/18/16 1005 07/18/16 1006  BP: (!) 175/91 (!) 164/88  Pulse: 67   Resp: 20   Temp: 97.6 F (36.4 C)   TempSrc: Oral   SpO2: 99%   Weight: 270 lb (122.5 kg)   Height: 6\' 2"  (1.88 m)     GENERAL: The patient is a well-nourished male, in no acute distress. The vital signs  are documented above. CARDIAC: There is a regular rate and rhythm.  VASCULAR: Significant bilateral lower extremity edema, left greater than right.  There is hyperpigmentation on the anterior shin on both legs, the left is more severe.  There are no open wounds.  Hand-held Doppler revealed brisk multiphasic signals within the posterior tibial artery at the ankle bilaterally. PULMONARY: Nonlabored respirations MUSCULOSKELETAL: There are no major deformities or cyanosis. NEUROLOGIC: No focal weakness or paresthesias are detected. SKIN: There are no ulcers or rashes noted. PSYCHIATRIC: The patient has a normal affect.  STUDIES:   None  ASSESSMENT and PLAN   Bilateral lower extremity edema with cellulitis: The patient has brisk posterior tibial Doppler signals bilaterally.  I do not think that his difficulty with cellulitis is related to arterial insufficiency.  I suspect this is all secondary to edema.  At this time it is unclear whether this is lymphedema or venous insufficiency.  Regardless, the patient will need to be kept in compression.  I would like to increase the level of compression to 20-30.  He was given a prescription and information on how to obtain a tighter grade compression.  He knows to where these first thing in the morning and take them off at night.  I have him scheduled to follow up with me again in 3 months.  At that time he will have a venous insufficiency ultrasound performed.   Annamarie Major, MD Vascular and Vein Specialists of [facility] [phone] Pager [phone]

## 2016-07-24 NOTE — Addendum Note (Signed)
Addended by: Lianne Cure A on: 07/24/2016 04:41 PM   Modules accepted: Orders

## 2016-08-09 ENCOUNTER — Other Ambulatory Visit: Payer: Self-pay | Admitting: Family Medicine

## 2016-09-17 DIAGNOSIS — L570 Actinic keratosis: Secondary | ICD-10-CM | POA: Diagnosis not present

## 2016-10-11 ENCOUNTER — Encounter: Payer: Self-pay | Admitting: Family Medicine

## 2016-10-11 ENCOUNTER — Ambulatory Visit (INDEPENDENT_AMBULATORY_CARE_PROVIDER_SITE_OTHER): Payer: Medicare Other | Admitting: Family Medicine

## 2016-10-11 VITALS — BP 137/78 | HR 62 | Temp 97.0°F | Ht 74.0 in | Wt 266.0 lb

## 2016-10-11 DIAGNOSIS — E7800 Pure hypercholesterolemia, unspecified: Secondary | ICD-10-CM | POA: Diagnosis not present

## 2016-10-11 DIAGNOSIS — N5201 Erectile dysfunction due to arterial insufficiency: Secondary | ICD-10-CM

## 2016-10-11 DIAGNOSIS — E559 Vitamin D deficiency, unspecified: Secondary | ICD-10-CM | POA: Diagnosis not present

## 2016-10-11 DIAGNOSIS — Z6834 Body mass index (BMI) 34.0-34.9, adult: Secondary | ICD-10-CM | POA: Diagnosis not present

## 2016-10-11 DIAGNOSIS — R609 Edema, unspecified: Secondary | ICD-10-CM

## 2016-10-11 DIAGNOSIS — H6121 Impacted cerumen, right ear: Secondary | ICD-10-CM | POA: Diagnosis not present

## 2016-10-11 DIAGNOSIS — I10 Essential (primary) hypertension: Secondary | ICD-10-CM

## 2016-10-11 MED ORDER — ROSUVASTATIN CALCIUM 5 MG PO TABS: ORAL_TABLET | ORAL | 1 refills | Status: DC

## 2016-10-11 NOTE — Patient Instructions (Signed)
Medicare Annual Wellness Visit  [facility] and the medical providers at [facility] strive to bring you the best medical care.  In doing so we not only want to address your current medical conditions and concerns but also to detect new conditions early and prevent illness, disease and health-related problems.    Medicare offers a yearly Wellness Visit which allows our clinical staff to assess your need for preventative services including immunizations, lifestyle education, counseling to decrease risk of preventable diseases and screening for fall risk and other medical concerns.    This visit is provided free of charge (no copay) for all Medicare recipients. The clinical pharmacists at [facility] have begun to conduct these Wellness Visits which will also include a thorough review of all your medications.    As you primary medical provider recommend that you make an appointment for your Annual Wellness Visit if you have not done so already this year.  You may set up this appointment before you leave today or you may call back ([phone]) and schedule an appointment.  Please make sure when you call that you mention that you are scheduling your Annual Wellness Visit with the clinical pharmacist so that the appointment may be made for the proper length of time.     Continue current medications. Continue good therapeutic lifestyle changes which include good diet and exercise. Fall precautions discussed with patient. If an FOBT was given today- please return it to our front desk. If you are over 50 years old - you may need Prevnar 13 or the adult Pneumonia vaccine.  **Flu shots are available--- please call and schedule a FLU-CLINIC appointment**  After your visit with us today you will receive a survey in the mail or online from [employer] regarding your care with us. Please take a moment to fill this out. Your feedback is very  important to us as you can help us better understand your patient needs as well as improve your experience and satisfaction. WE CARE ABOUT YOU!!!    

## 2016-10-11 NOTE — Progress Notes (Signed)
Subjective:    Patient ID: [name], male    DOB: [birth year], 83 y.o.   MRN: [id number]  HPI Pt here for follow up and management of chronic medical problems which includes hyperlipidemia and hypertension. He is taking medication regularly.The patient is doing well overall. He has this ongoing problem with cellulitis and edema in his legs. He is due to return an FOBT and has had lab work done which we will review with him during the visit today. He is not requesting any refills. His weight is down about 4 pounds since the last visit but he still has a BMI is 34.7. A traditional lipid panel has an LDL C cholesterol that is 115 and this is typical over the past year for his cholesterol number. His HDL is excellent triglycerides are good at 91. The blood sugar is good at 89 and the creatinine is within normal limits at 1.13. All of the electrolytes are within normal limits. The CBC had a good hemoglobin at 15.3 normal white blood cell count and an adequate platelet count. All liver function tests were normal. The patient is doing well and staying active and looks much younger than his stated age of 23 years. The patient denies any chest pain or shortness of breath. He denies any trouble with swallowing heartburn indigestion nausea vomiting diarrhea blood in the stool or black tarry bowel movements. He is passing his water without problems. He absolutely has no complaints other than the venous insufficiency that he has.(Helped mostly by wearing lighter support hose in the summer and have her support hose in the winter. He takes these off at nighttime. As long as he wears a hose he has no problems with the cellulitis.    Patient Active Problem List   Diagnosis Date Noted  . Essential hypertension 08/16/2014  . Hyperlipemia 08/16/2014  . Peripheral edema 06/10/2012  . Hypertension 05/10/2010  . Vitamin D deficiency 05/10/2010  . Arthritis 05/10/2010   Outpatient Encounter Prescriptions as of  10/11/2016  Medication Sig  . cephALEXin (KEFLEX) 500 MG capsule Take 1 capsule (500 mg total) by mouth 3 (three) times daily.  . fish oil-omega-3 fatty acids 1000 MG capsule Take 2 g by mouth daily.    . furosemide (LASIX) 20 MG tablet TAKE ONE TABLET BY MOUTH TWICE DAILY  . lisinopril (PRINIVIL,ZESTRIL) 40 MG tablet Take 1 Tablet by mouth once daily  . triamcinolone ointment (KENALOG) 0.5 % Apply 1 application topically 2 (two) times daily.  . Vitamin D, Ergocalciferol, (DRISDOL) 50000 units CAPS capsule TAKE ONE CAPSULE BY MOUTH every SEVEN DAYS   No facility-administered encounter medications on file as of 10/11/2016.       Review of Systems  Constitutional: Negative.   HENT: Negative.   Eyes: Negative.   Respiratory: Negative.   Cardiovascular: Negative.   Gastrointestinal: Negative.   Endocrine: Negative.   Genitourinary: Negative.   Musculoskeletal: Negative.   Skin: Negative.        Cellulitis and edema  Allergic/Immunologic: Negative.   Neurological: Negative.   Hematological: Negative.   Psychiatric/Behavioral: Negative.        Objective:   Physical Exam  Constitutional: He is oriented to person, place, and time. He appears well-developed and well-nourished.  The patient is pleasant and alert and as indicated earlier appears much much younger than his stated age. Both of his parents and his mother side especially lived to be an older age except his mother did die of cancer. But there is  longevity on both sides of the family.  HENT:  Head: Normocephalic and atraumatic.  Left Ear: External ear normal.  Nose: Nose normal.  Mouth/Throat: Oropharynx is clear and moist. No oropharyngeal exudate.  Ear cerumen right ear canal  Eyes: Pupils are equal, round, and reactive to light. Conjunctivae and EOM are normal. Right eye exhibits no discharge. Left eye exhibits no discharge. No scleral icterus.  Neck: Normal range of motion. Neck supple. No thyromegaly present.  No bruits  thyromegaly or anterior cervical adenopathy  Cardiovascular: Normal rate, regular rhythm, normal heart sounds and intact distal pulses.   No murmur heard. The heart is regular at 60/m  Pulmonary/Chest: Effort normal and breath sounds normal. No respiratory distress. He has no wheezes. He has no rales. He exhibits no tenderness.  No axillary adenopathy  Abdominal: Soft. Bowel sounds are normal. He exhibits no mass. There is no tenderness. There is no rebound and no guarding.  The abdomen is mildly obese without masses tenderness or organ enlargement or bruits  Musculoskeletal: Normal range of motion. He exhibits no edema.  Minimal edema today with support hose. Good range of motion without swelling or discomfort  Lymphadenopathy:    He has no cervical adenopathy.  Neurological: He is alert and oriented to person, place, and time. He has normal reflexes. No cranial nerve deficit.  Skin: Skin is warm and dry. No rash noted.  Psychiatric: He has a normal mood and affect. His behavior is normal. Judgment and thought content normal.  Nursing note and vitals reviewed.  BP 137/78 (BP Location: Left Arm)   Pulse 62   Temp (!) 97 F (36.1 C) (Oral)   Ht 6\' 2"  (1.88 m)   Wt 266 lb (120.7 kg)   BMI 34.15 kg/m         Assessment & Plan:  1. Vitamin D deficiency -Continue current treatment  2. Essential hypertension -The blood pressure is good and he will continue with current treatment  3. Pure hypercholesterolemia -The patient will start a very low dose of Crestor one half of a 5 mg tablet 3 days a week on Monday Wednesday and Friday to see if this helps bring his LDL C down lower. We will get a lipid liver panel on him in about 6 weeks.  4. Swelling -Continue with support hose and sodium restriction - Thyroid Panel With TSH  5. Impacted cerumen of right ear -Ear irrigation to remove cerumen  6. BMI 34.0-34.9,adult -Continue to watch diet closely and exercise as tolerated and  watch sodium intake  7.Erectile dysfunction   Meds ordered this encounter  Medications  . rosuvastatin (CRESTOR) 5 MG tablet    Sig: Take 0.5 tab on Monday, Wednesday and Friday evenings    Dispense:  30 tablet    Refill:  1     Patient Instructions                       Medicare Annual Wellness Visit  [facility] and the medical providers at [facility] strive to bring you the best medical care.  In doing so we not only want to address your current medical conditions and concerns but also to detect new conditions early and prevent illness, disease and health-related problems.    Medicare offers a yearly Wellness Visit which allows our clinical staff to assess your need for preventative services including immunizations, lifestyle education, counseling to decrease risk of preventable diseases and screening for fall risk  and other medical concerns.    This visit is provided free of charge (no copay) for all Medicare recipients. The clinical pharmacists at [facility] have begun to conduct these Wellness Visits which will also include a thorough review of all your medications.    As you primary medical provider recommend that you make an appointment for your Annual Wellness Visit if you have not done so already this year.  You may set up this appointment before you leave today or you may call back ([phone]) and schedule an appointment.  Please make sure when you call that you mention that you are scheduling your Annual Wellness Visit with the clinical pharmacist so that the appointment may be made for the proper length of time.     Continue current medications. Continue good therapeutic lifestyle changes which include good diet and exercise. Fall precautions discussed with patient. If an FOBT was given today- please return it to our front desk. If you are over 8 years old - you may need Prevnar 73 or the adult Pneumonia vaccine.  **Flu  shots are available--- please call and schedule a FLU-CLINIC appointment**  After your visit with [place] today you will receive a survey in the mail or online from [employer] regarding your care with [place]. Please take a moment to fill this out. Your feedback is very important to [place] as you can help [place] better understand your patient needs as well as improve your experience and satisfaction. WE CARE ABOUT YOU!!!     Arrie Senate MD

## 2016-10-12 LAB — THYROID PANEL WITH TSH
Free Thyroxine Index: 2.4 (ref 1.2–4.9)
T3 Uptake Ratio: 26 % (ref 24–39)
T4, Total: 9.2 ug/dL (ref 4.5–12.0)
TSH: 4.32 u[IU]/mL (ref 0.450–4.500)

## 2016-10-18 ENCOUNTER — Other Ambulatory Visit: Payer: Self-pay | Admitting: Family Medicine

## 2016-10-18 DIAGNOSIS — I878 Other specified disorders of veins: Secondary | ICD-10-CM

## 2016-10-18 DIAGNOSIS — I10 Essential (primary) hypertension: Secondary | ICD-10-CM

## 2016-10-22 ENCOUNTER — Encounter (HOSPITAL_COMMUNITY): Payer: Medicare Other

## 2016-10-22 ENCOUNTER — Ambulatory Visit: Payer: Medicare Other | Admitting: Surgery

## 2016-11-13 ENCOUNTER — Other Ambulatory Visit: Payer: Self-pay | Admitting: Family Medicine

## 2016-11-19 ENCOUNTER — Other Ambulatory Visit: Payer: Self-pay | Admitting: Family Medicine

## 2016-11-26 ENCOUNTER — Ambulatory Visit (INDEPENDENT_AMBULATORY_CARE_PROVIDER_SITE_OTHER): Payer: Medicare Other | Admitting: *Deleted

## 2016-11-26 DIAGNOSIS — Z23 Encounter for immunization: Secondary | ICD-10-CM

## 2016-12-04 ENCOUNTER — Other Ambulatory Visit: Payer: Self-pay | Admitting: *Deleted

## 2016-12-04 DIAGNOSIS — L03116 Cellulitis of left lower limb: Secondary | ICD-10-CM

## 2016-12-04 MED ORDER — CEPHALEXIN 500 MG PO CAPS: 500.0000 mg | ORAL_CAPSULE | Freq: Three times a day (TID) | ORAL | 0 refills | Status: DC

## 2017-01-24 ENCOUNTER — Telehealth: Payer: Self-pay | Admitting: Family Medicine

## 2017-01-24 NOTE — Telephone Encounter (Signed)
Called and informed patient that he would need to be seen or do an evisit.  Patient states that he will not come in,  He does not want to get out in the rain

## 2017-01-24 NOTE — Telephone Encounter (Signed)
NTBS or do an evisit

## 2017-01-24 NOTE — Telephone Encounter (Signed)
Pt wants something called in for head cold states got cough, sinus draianage denies sore throat or fever wants abx called into [facility].

## 2017-01-26 ENCOUNTER — Encounter: Payer: Self-pay | Admitting: Family Medicine

## 2017-01-26 ENCOUNTER — Ambulatory Visit (INDEPENDENT_AMBULATORY_CARE_PROVIDER_SITE_OTHER): Payer: Medicare Other | Admitting: Family Medicine

## 2017-01-26 VITALS — BP 138/76 | HR 72 | Temp 97.0°F | Ht 74.0 in | Wt 264.8 lb

## 2017-01-26 DIAGNOSIS — J0100 Acute maxillary sinusitis, unspecified: Secondary | ICD-10-CM

## 2017-01-26 MED ORDER — FLUTICASONE PROPIONATE 50 MCG/ACT NA SUSP: 1.0000 | Freq: Two times a day (BID) | NASAL | 6 refills | Status: DC | PRN

## 2017-01-26 MED ORDER — AZITHROMYCIN 250 MG PO TABS: ORAL_TABLET | ORAL | 0 refills | Status: DC

## 2017-01-26 MED ORDER — VITAMIN D (ERGOCALCIFEROL) 1.25 MG (50000 UNIT) PO CAPS: ORAL_CAPSULE | ORAL | 0 refills | Status: DC

## 2017-01-26 NOTE — Progress Notes (Signed)
BP 138/76   Pulse 72   Temp (!) 97 F (36.1 C) (Oral)   Ht 6\' 2"  (1.88 m)   Wt 264 lb 12.8 oz (120.1 kg)   BMI 34.00 kg/m    Subjective:    Patient ID: [name], male    DOB: 03/07/1929, 83 y.o.   MRN: [id number]  HPI: [name] is a 83 y.o. male presenting on 01/26/2017 for Sinusitis (5-6 days, no known fever, OTC tylenol sinus)   HPI Cough and sinus congestion and drainage Patient has been having cough and sinus congestion and drainage that is been going on about 5 or 6 days.  He denies any fever.  He has been taking over the counter Tylenol Sinus and Alka-Seltzer which has helped some and he does actually feel like he is slightly better today but was coming in because were not open any more days until after Christmas.  He denies any sick contacts that he knows.  He has had a sinus headache and pressure in his face just under his eyes.  Relevant past medical, surgical, family and social history reviewed and updated as indicated. Interim medical history since our last visit reviewed. Allergies and medications reviewed and updated.  Review of Systems  Constitutional: Negative for chills and fever.  HENT: Positive for congestion, postnasal drip, rhinorrhea, sinus pressure and sore throat. Negative for ear discharge, ear pain, sneezing and voice change.   Eyes: Negative for pain, discharge, redness and visual disturbance.  Respiratory: Positive for cough. Negative for shortness of breath and wheezing.   Cardiovascular: Negative for chest pain and leg swelling.  Musculoskeletal: Negative for gait problem.  Skin: Negative for rash.  All other systems reviewed and are negative.   Per HPI unless specifically indicated above        Objective:    BP 138/76   Pulse 72   Temp (!) 97 F (36.1 C) (Oral)   Ht 6\' 2"  (1.88 m)   Wt 264 lb 12.8 oz (120.1 kg)   BMI 34.00 kg/m   Wt Readings from Last 3 Encounters:  01/26/17 264 lb 12.8 oz (120.1 kg)  10/11/16 266 lb  (120.7 kg)  07/18/16 270 lb (122.5 kg)    Physical Exam  Constitutional: He is oriented to person, place, and time. He appears well-developed and well-nourished. No distress.  HENT:  Right Ear: Tympanic membrane, external ear and ear canal normal.  Left Ear: Tympanic membrane, external ear and ear canal normal.  Nose: Mucosal edema and rhinorrhea present. No sinus tenderness. No epistaxis. Right sinus exhibits maxillary sinus tenderness. Right sinus exhibits no frontal sinus tenderness. Left sinus exhibits maxillary sinus tenderness. Left sinus exhibits no frontal sinus tenderness.  Mouth/Throat: Uvula is midline and mucous membranes are normal. Posterior oropharyngeal edema and posterior oropharyngeal erythema present. No oropharyngeal exudate or tonsillar abscesses.  Eyes: Conjunctivae and EOM are normal. Pupils are equal, round, and reactive to light. No scleral icterus.  Neck: Neck supple. No thyromegaly present.  Cardiovascular: Normal rate, regular rhythm, normal heart sounds and intact distal pulses.  No murmur heard. Pulmonary/Chest: Effort normal and breath sounds normal. No respiratory distress. He has no wheezes. He has no rales.  Musculoskeletal: Normal range of motion.  Lymphadenopathy:    He has no cervical adenopathy.  Neurological: He is alert and oriented to person, place, and time. Coordination normal.  Skin: Skin is warm and dry. No rash noted. He is not diaphoretic.  Psychiatric: He has a normal mood and  affect. His behavior is normal.  Nursing note and vitals reviewed.       Assessment & Plan:   Problem List Items Addressed This Visit    None    Visit Diagnoses    Acute non-recurrent maxillary sinusitis    -  Primary   Recommended for patient to use Flonase first and see if he can improve it with the Flonase but since it is the Christmas break I sent the azithromycin   Relevant Medications   azithromycin (ZITHROMAX) 250 MG tablet   fluticasone (FLONASE) 50  MCG/ACT nasal spray       Follow up plan: Return if symptoms worsen or fail to improve.  Counseling provided for all of the vaccine components No orders of the defined types were placed in this encounter.   Caryl Pina, MD [facility] 01/26/2017, 9:53 AM

## 2017-02-11 ENCOUNTER — Telehealth: Payer: Self-pay | Admitting: Family Medicine

## 2017-02-11 ENCOUNTER — Other Ambulatory Visit: Payer: Self-pay | Admitting: Family Medicine

## 2017-02-11 DIAGNOSIS — J0100 Acute maxillary sinusitis, unspecified: Secondary | ICD-10-CM

## 2017-02-11 MED ORDER — LISINOPRIL 40 MG PO TABS: 40.0000 mg | ORAL_TABLET | Freq: Every day | ORAL | 0 refills | Status: DC

## 2017-02-11 NOTE — Telephone Encounter (Signed)
Refill of Lisinopril sent to [facility], patient aware

## 2017-03-05 ENCOUNTER — Other Ambulatory Visit: Payer: Medicare Other

## 2017-03-05 ENCOUNTER — Encounter: Payer: Self-pay | Admitting: Family Medicine

## 2017-03-05 ENCOUNTER — Ambulatory Visit (INDEPENDENT_AMBULATORY_CARE_PROVIDER_SITE_OTHER): Payer: Medicare Other | Admitting: Family Medicine

## 2017-03-05 VITALS — BP 153/83 | HR 63 | Temp 96.5°F | Ht 74.0 in | Wt 268.0 lb

## 2017-03-05 DIAGNOSIS — E559 Vitamin D deficiency, unspecified: Secondary | ICD-10-CM

## 2017-03-05 DIAGNOSIS — I10 Essential (primary) hypertension: Secondary | ICD-10-CM | POA: Diagnosis not present

## 2017-03-05 DIAGNOSIS — E7800 Pure hypercholesterolemia, unspecified: Secondary | ICD-10-CM

## 2017-03-05 DIAGNOSIS — J069 Acute upper respiratory infection, unspecified: Secondary | ICD-10-CM | POA: Diagnosis not present

## 2017-03-05 MED ORDER — AMOXICILLIN 500 MG PO CAPS: 500.0000 mg | ORAL_CAPSULE | Freq: Three times a day (TID) | ORAL | 0 refills | Status: DC

## 2017-03-05 MED ORDER — FLUTICASONE PROPIONATE 50 MCG/ACT NA SUSP: 2.0000 | Freq: Every day | NASAL | 6 refills | Status: DC

## 2017-03-05 NOTE — Progress Notes (Signed)
Subjective:    Patient ID: [name], male    DOB: 1930-01-30, 84 y.o.   MRN: [id number]  HPI Pt here for follow up and management of chronic medical problems which includes hyperlipidemia and hypertension. He is taking medication regularly.  Patient is doing well overall and does complain today of cold and congestion.  His systolic blood pressure is slightly elevated.  He will be given an FOBT to return get a chest x-ray and will get lab work today.  He has a diagnosis of hyperlipidemia hypertension vitamin D deficiency and wear and tear arthritis.  He also has a problem with venous insufficiency and especially problems with cellulitis in his lower extremities wearing support hose.  Patient complains of persistent head congestion and drainage with minimal cough.  He denies any headache.  He denies any trouble with chest pain or shortness of breath.  He has no trouble with his intestinal tract including nausea vomiting diarrhea or blood in the stool.  He appears to be alert and enjoying his life even at his age and looks much younger than his stated age of 14 years.     Patient Active Problem List   Diagnosis Date Noted  . Essential hypertension 08/16/2014  . Hyperlipemia 08/16/2014  . Peripheral edema 06/10/2012  . Hypertension 05/10/2010  . Vitamin D deficiency 05/10/2010  . Arthritis 05/10/2010   Outpatient Encounter Medications as of 03/05/2017  Medication Sig  . fish oil-omega-3 fatty acids 1000 MG capsule Take 2 g by mouth daily.    . fluticasone (FLONASE) 50 MCG/ACT nasal spray Place 1 spray into both nostrils 2 (two) times daily as needed for allergies or rhinitis.  . furosemide (LASIX) 20 MG tablet TAKE ONE TABLET BY MOUTH TWICE DAILY  . lisinopril (PRINIVIL,ZESTRIL) 40 MG tablet Take 1 tablet (40 mg total) by mouth daily.  . rosuvastatin (CRESTOR) 5 MG tablet Take 0.5 tab on Monday, Wednesday and Friday evenings  . triamcinolone ointment (KENALOG) 0.5 % Apply 1 application  topically 2 (two) times daily.  . Vitamin D, Ergocalciferol, (DRISDOL) 50000 units CAPS capsule TAKE ONE CAPSULE BY MOUTH every SEVEN DAYS  . [DISCONTINUED] azithromycin (ZITHROMAX) 250 MG tablet Take 2 the first day and then one each day after.   No facility-administered encounter medications on file as of 03/05/2017.       Review of Systems  Constitutional: Negative.   HENT: Positive for congestion (/ cold ).   Eyes: Negative.   Respiratory: Negative.   Cardiovascular: Negative.   Gastrointestinal: Negative.   Endocrine: Negative.   Genitourinary: Negative.   Musculoskeletal: Negative.   Skin: Negative.   Allergic/Immunologic: Negative.   Neurological: Negative.   Hematological: Negative.   Psychiatric/Behavioral: Negative.        Objective:   Physical Exam  Constitutional: He is oriented to person, place, and time. He appears well-developed and well-nourished.  The patient is pleasant and alert and looks much younger than his stated age  HENT:  Head: Normocephalic and atraumatic.  Right Ear: External ear normal.  Left Ear: External ear normal.  Mouth/Throat: Oropharynx is clear and moist. No oropharyngeal exudate.  Minimal sinus tenderness but increased congestion on the left side with nasal turbinate redness and swelling.  Eyes: Conjunctivae and EOM are normal. Pupils are equal, round, and reactive to light. Right eye exhibits no discharge. Left eye exhibits no discharge. No scleral icterus.  Neck: Normal range of motion. Neck supple. No thyromegaly present.  No bruits thyromegaly or anterior cervical  adenopathy  Cardiovascular: Normal rate, regular rhythm and normal heart sounds.  No murmur heard. Heart is regular at 60/min  Pulmonary/Chest: Effort normal and breath sounds normal. No respiratory distress. He has no wheezes. He has no rales. He exhibits no tenderness.  Clear anteriorly and posteriorly and no axillary adenopathy  Abdominal: Soft. Bowel sounds are normal.  He exhibits no mass. There is no tenderness. There is no rebound and no guarding.  No liver or spleen enlargement no epigastric tenderness and no abdominal masses and no inguinal adenopathy  Musculoskeletal: Normal range of motion. He exhibits no edema.  The patient continues to wear his support hose and has done wonderful with this with controlling his cellulitis  Lymphadenopathy:    He has no cervical adenopathy.  Neurological: He is alert and oriented to person, place, and time. He has normal reflexes. No cranial nerve deficit.  Skin: Skin is warm and dry. No rash noted.  Psychiatric: He has a normal mood and affect. His behavior is normal. Judgment and thought content normal.  Nursing note and vitals reviewed.   BP (!) 153/83 (BP Location: Left Arm)   Pulse 63   Temp (!) 96.5 F (35.8 C) (Oral)   Ht 6' 2"  (1.88 m)   Wt 268 lb (121.6 kg)   BMI 34.41 kg/m  Chest x-ray with results pending===      Assessment & Plan:  1. Essential hypertension -Continue current treatment  -Watch sodium intake - CBC with Differential/Platelet - BMP8+EGFR - Hepatic function panel - DG Chest 2 View; Future  2. Pure hypercholesterolemia -Continue current treatment of omega-3 fatty acids.  And aggressive therapeutic lifestyle changes - CBC with Differential/Platelet - Lipid panel - DG Chest 2 View; Future  3. Vitamin D deficiency -Continue current treatment pending results of lab work - CBC with Differential/Platelet - VITAMIN D 25 Hydroxy (Vit-D Deficiency, Fractures)  4. Upper respiratory tract infection, unspecified type -Take amoxicillin, use Flonase regularly and use nasal saline and take Mucinex for cough and congestion  Meds ordered this encounter  Medications  . amoxicillin (AMOXIL) 500 MG capsule    Sig: Take 1 capsule (500 mg total) by mouth 3 (three) times daily.    Dispense:  30 capsule    Refill:  0  . fluticasone (FLONASE) 50 MCG/ACT nasal spray    Sig: Place 2 sprays  into both nostrils daily.    Dispense:  16 g    Refill:  6   Patient Instructions                       Medicare Annual Wellness Visit  [place] and the medical providers at [facility] strive to bring you the best medical care.  In doing so we not only want to address your current medical conditions and concerns but also to detect new conditions early and prevent illness, disease and health-related problems.    Medicare offers a yearly Wellness Visit which allows our clinical staff to assess your need for preventative services including immunizations, lifestyle education, counseling to decrease risk of preventable diseases and screening for fall risk and other medical concerns.    This visit is provided free of charge (no copay) for all Medicare recipients. The clinical pharmacists at [facility] have begun to conduct these Wellness Visits which will also include a thorough review of all your medications.    As you primary medical provider recommend that you make an appointment for  your Annual Wellness Visit if you have not done so already this year.  You may set up this appointment before you leave today or you may call back ([phone]) and schedule an appointment.  Please make sure when you call that you mention that you are scheduling your Annual Wellness Visit with the clinical pharmacist so that the appointment may be made for the proper length of time.     Continue current medications. Continue good therapeutic lifestyle changes which include good diet and exercise. Fall precautions discussed with patient. If an FOBT was given today- please return it to our front desk. If you are over 6 years old - you may need Prevnar 72 or the adult Pneumonia vaccine.  **Flu shots are available--- please call and schedule a FLU-CLINIC appointment**  After your visit with [place] today you will receive a survey in the mail or online from [employer]  regarding your care with [place]. Please take a moment to fill this out. Your feedback is very important to [place] as you can help [place] better understand your patient needs as well as improve your experience and satisfaction. WE CARE ABOUT YOU!!!     Arrie Senate MD

## 2017-03-05 NOTE — Patient Instructions (Signed)
Medicare Annual Wellness Visit  [place] and the medical providers at [facility] strive to bring you the best medical care.  In doing so we not only want to address your current medical conditions and concerns but also to detect new conditions early and prevent illness, disease and health-related problems.    Medicare offers a yearly Wellness Visit which allows our clinical staff to assess your need for preventative services including immunizations, lifestyle education, counseling to decrease risk of preventable diseases and screening for fall risk and other medical concerns.    This visit is provided free of charge (no copay) for all Medicare recipients. The clinical pharmacists at [facility] have begun to conduct these Wellness Visits which will also include a thorough review of all your medications.    As you primary medical provider recommend that you make an appointment for your Annual Wellness Visit if you have not done so already this year.  You may set up this appointment before you leave today or you may call back ([phone]) and schedule an appointment.  Please make sure when you call that you mention that you are scheduling your Annual Wellness Visit with the clinical pharmacist so that the appointment may be made for the proper length of time.     Continue current medications. Continue good therapeutic lifestyle changes which include good diet and exercise. Fall precautions discussed with patient. If an FOBT was given today- please return it to our front desk. If you are over 50 years old - you may need Prevnar 13 or the adult Pneumonia vaccine.  **Flu shots are available--- please call and schedule a FLU-CLINIC appointment**  After your visit with us today you will receive a survey in the mail or online from [employer] regarding your care with us. Please take a moment to fill this out. Your feedback is very  important to us as you can help us better understand your patient needs as well as improve your experience and satisfaction. WE CARE ABOUT YOU!!!    

## 2017-03-06 LAB — LIPID PANEL
Chol/HDL Ratio: 3.5 ratio (ref 0.0–5.0)
Cholesterol, Total: 187 mg/dL (ref 100–199)
HDL: 54 mg/dL (ref >39)
LDL Calculated: 112 mg/dL — ABNORMAL HIGH (ref 0–99)
Triglycerides: 107 mg/dL (ref 0–149)
VLDL Cholesterol Cal: 21 mg/dL (ref 5–40)

## 2017-03-06 LAB — BMP8+EGFR
BUN/Creatinine Ratio: 17 (ref 10–24)
BUN: 18 mg/dL (ref 8–27)
CO2: 24 mmol/L (ref 20–29)
Calcium: 9 mg/dL (ref 8.6–10.2)
Chloride: 100 mmol/L (ref 96–106)
Creatinine, Ser: 1.06 mg/dL (ref 0.76–1.27)
GFR calc Af Amer: 74 mL/min/{1.73_m2} (ref >59)
GFR calc non Af Amer: 64 mL/min/{1.73_m2} (ref >59)
Glucose: 75 mg/dL (ref 65–99)
Potassium: 4.8 mmol/L (ref 3.5–5.2)
Sodium: 140 mmol/L (ref 134–144)

## 2017-03-06 LAB — VITAMIN D 25 HYDROXY (VIT D DEFICIENCY, FRACTURES): Vit D, 25-Hydroxy: 42.6 ng/mL (ref 30.0–100.0)

## 2017-03-06 LAB — CBC WITH DIFFERENTIAL/PLATELET
Basophils Absolute: 0 10*3/uL (ref 0.0–0.2)
Basos: 0 %
EOS (ABSOLUTE): 0.1 10*3/uL (ref 0.0–0.4)
Eos: 1 %
Hematocrit: 45.8 % (ref 37.5–51.0)
Hemoglobin: 15.6 g/dL (ref 13.0–17.7)
Immature Grans (Abs): 0 10*3/uL (ref 0.0–0.1)
Immature Granulocytes: 0 %
Lymphocytes Absolute: 2.4 10*3/uL (ref 0.7–3.1)
Lymphs: 32 %
MCH: 32 pg (ref 26.6–33.0)
MCHC: 34.1 g/dL (ref 31.5–35.7)
MCV: 94 fL (ref 79–97)
Monocytes Absolute: 0.8 10*3/uL (ref 0.1–0.9)
Monocytes: 11 %
Neutrophils Absolute: 4 10*3/uL (ref 1.4–7.0)
Neutrophils: 56 %
Platelets: 250 10*3/uL (ref 150–379)
RBC: 4.88 x10E6/uL (ref 4.14–5.80)
RDW: 13.2 % (ref 12.3–15.4)
WBC: 7.4 10*3/uL (ref 3.4–10.8)

## 2017-03-06 LAB — HEPATIC FUNCTION PANEL
ALT: 13 IU/L (ref 0–44)
AST: 17 IU/L (ref 0–40)
Albumin: 4.1 g/dL (ref 3.5–4.7)
Alkaline Phosphatase: 69 IU/L (ref 39–117)
Bilirubin Total: 0.4 mg/dL (ref 0.0–1.2)
Bilirubin, Direct: 0.12 mg/dL (ref 0.00–0.40)
Total Protein: 6.4 g/dL (ref 6.0–8.5)

## 2017-03-27 DIAGNOSIS — L570 Actinic keratosis: Secondary | ICD-10-CM | POA: Diagnosis not present

## 2017-04-01 ENCOUNTER — Telehealth: Payer: Self-pay | Admitting: Family Medicine

## 2017-04-01 DIAGNOSIS — I878 Other specified disorders of veins: Secondary | ICD-10-CM

## 2017-04-01 MED ORDER — TRIAMCINOLONE ACETONIDE 0.5 % EX OINT: 1.0000 "application " | TOPICAL_OINTMENT | Freq: Two times a day (BID) | CUTANEOUS | 0 refills | Status: DC

## 2017-04-01 MED ORDER — CLOTRIMAZOLE-BETAMETHASONE 1-0.05 % EX CREA: TOPICAL_CREAM | CUTANEOUS | 5 refills | Status: DC

## 2017-04-01 NOTE — Telephone Encounter (Signed)
Refill sent to pharmacy.   

## 2017-04-01 NOTE — Telephone Encounter (Signed)
Pt is actually wanting clotrimazole-betamethasone refilled which he last had in July 2015 Please advise [facility]

## 2017-04-01 NOTE — Addendum Note (Signed)
Addended by: Zannie Cove on: 04/01/2017 01:04 PM   Modules accepted: Orders

## 2017-04-10 ENCOUNTER — Telehealth: Payer: Self-pay | Admitting: *Deleted

## 2017-04-10 DIAGNOSIS — I878 Other specified disorders of veins: Secondary | ICD-10-CM

## 2017-04-10 DIAGNOSIS — I10 Essential (primary) hypertension: Secondary | ICD-10-CM

## 2017-04-10 DIAGNOSIS — L03116 Cellulitis of left lower limb: Secondary | ICD-10-CM

## 2017-04-10 MED ORDER — FUROSEMIDE 20 MG PO TABS: 20.0000 mg | ORAL_TABLET | Freq: Two times a day (BID) | ORAL | 0 refills | Status: DC

## 2017-04-10 MED ORDER — CEPHALEXIN 500 MG PO CAPS
500.0000 mg | ORAL_CAPSULE | Freq: Three times a day (TID) | ORAL | 0 refills | Status: DC
Start: 2017-04-10 — End: 2017-06-12

## 2017-04-10 NOTE — Telephone Encounter (Signed)
Pt hit leg on tree limb Now has redness Per Dr Laurance Flatten Keflex 500mg  x 10 days RX sent to [facility]

## 2017-06-07 ENCOUNTER — Other Ambulatory Visit: Payer: Self-pay | Admitting: Family Medicine

## 2017-06-07 DIAGNOSIS — L03116 Cellulitis of left lower limb: Secondary | ICD-10-CM

## 2017-06-12 ENCOUNTER — Ambulatory Visit: Payer: Medicare Other | Admitting: Family Medicine

## 2017-06-12 ENCOUNTER — Encounter: Payer: Self-pay | Admitting: Family Medicine

## 2017-06-12 VITALS — BP 123/74 | HR 71 | Temp 97.1°F | Ht 74.0 in | Wt 273.0 lb

## 2017-06-12 DIAGNOSIS — L03116 Cellulitis of left lower limb: Secondary | ICD-10-CM

## 2017-06-12 MED ORDER — CEPHALEXIN 250 MG PO CAPS: 250.0000 mg | ORAL_CAPSULE | Freq: Four times a day (QID) | ORAL | 0 refills | Status: AC

## 2017-06-12 NOTE — Progress Notes (Signed)
Subjective: CC: LLE wound PCP: Chipper Herb, MD YPP:JKDTO [name] is a 84 y.o. male presenting to clinic today for:  1. LLE wound Patient reports that he has had a wound on his left lower extremity for about a month now.  He notes that he has cellulitis at baseline and has been treated multiple times with oral antibiotics for this.  He currently uses compression hose to help reduce lower extremity edema.  He reports that he had actually sustained a wound on the posterior left calf while working outside.  Denies any purulence from wound.  He has been applying an unknown cream to the affected area with little improvement in symptoms.  Denies any fevers, chills.  He does have mild tenderness to palpation over the affected area.   ROS: Per HPI  No Known Allergies Past Medical History:  Diagnosis Date  . Hyperlipidemia   . Hypertension     Current Outpatient Medications:  .  clotrimazole-betamethasone (LOTRISONE) cream, APPLY TO AFFECTED AREA TWICE DAILY, Disp: 30 g, Rfl: 5 .  fish oil-omega-3 fatty acids 1000 MG capsule, Take 2 g by mouth daily.  , Disp: , Rfl:  .  fluticasone (FLONASE) 50 MCG/ACT nasal spray, Place 2 sprays into both nostrils daily., Disp: 16 g, Rfl: 6 .  furosemide (LASIX) 20 MG tablet, Take 1 tablet (20 mg total) by mouth 2 (two) times daily., Disp: 180 tablet, Rfl: 0 .  lisinopril (PRINIVIL,ZESTRIL) 40 MG tablet, Take 1 tablet (40 mg total) by mouth daily., Disp: 90 tablet, Rfl: 0 .  triamcinolone ointment (KENALOG) 0.5 %, Apply 1 application topically 2 (two) times daily., Disp: 30 g, Rfl: 0 .  Vitamin D, Ergocalciferol, (DRISDOL) 50000 units CAPS capsule, TAKE ONE CAPSULE BY MOUTH every SEVEN DAYS, Disp: 12 capsule, Rfl: 0 .  rosuvastatin (CRESTOR) 5 MG tablet, Take 0.5 tab on Monday, Wednesday and Friday evenings (Patient not taking: Reported on 06/12/2017), Disp: 30 tablet, Rfl: 1 Social History   Socioeconomic History  . Marital status: Single    Spouse  name: Not on file  . Number of children: Not on file  . Years of education: Not on file  . Highest education level: Not on file  Occupational History  . Not on file  Social Needs  . Financial resource strain: Not on file  . Food insecurity:    Worry: Not on file    Inability: Not on file  . Transportation needs:    Medical: Not on file    Non-medical: Not on file  Tobacco Use  . Smoking status: Never Smoker  . Smokeless tobacco: Never Used  Substance and Sexual Activity  . Alcohol use: No  . Drug use: No  . Sexual activity: Never  Lifestyle  . Physical activity:    Days per week: Not on file    Minutes per session: Not on file  . Stress: Not on file  Relationships  . Social connections:    Talks on phone: Not on file    Gets together: Not on file    Attends religious service: Not on file    Active member of club or organization: Not on file    Attends meetings of clubs or organizations: Not on file    Relationship status: Not on file  . Intimate partner violence:    Fear of current or ex partner: Not on file    Emotionally abused: Not on file    Physically abused: Not on file  Forced sexual activity: Not on file  Other Topics Concern  . Not on file  Social History Narrative  . Not on file   Family History  Problem Relation Age of Onset  . Cancer Mother   . Stroke Father   . Dementia Brother   . Healthy Daughter   . Healthy Son   . Healthy Son     Objective: Office vital signs reviewed. BP 123/74   Pulse 71   Temp (!) 97.1 F (36.2 C) (Oral)   Ht 6\' 2"  (1.88 m)   Wt 273 lb (123.8 kg)   BMI 35.05 kg/m   Physical Examination:  General: Awake, alert, obese, nontoxic, No acute distress Extremities: warm, 1+ pitting LE edema, cyanosis or clubbing; +1 pulses bilaterally MSK: normal gait and normal station Skin: There is a quarter sized area of skin breakdown along the lower posterior calf on the left.  No appreciable purulence or exudate from wound.   There is mild tenderness to palpation to the wound.  He does have quite a bit of venous stasis changes within the skin.  There is certainly mild erythema surrounding the wound.  No palpable fluctuance.  Assessment/ Plan: 84 y.o. male   1. Left leg cellulitis Will treat with oral antibiotics given duration of symptoms.  Will treat with Keflex 4 times daily for the next 10 days.  Home care instructions were reviewed.  I did advise him to discontinue use of the topical cream that he has been using.  Both listed creams on his MAR have steroid in them.  This would certainly slow healing.  Continue compression hose.  Continue to keep wound clean with mild soap and water.  Reasons for return and emergent evaluation discussed with the patient.  He voiced good understanding.  He will follow-up in 2 weeks for wound recheck with PCP. - cephALEXin (KEFLEX) 250 MG capsule; Take 1 capsule (250 mg total) by mouth 4 (four) times daily for 10 days.  Dispense: 40 capsule; Refill: 0  Meds ordered this encounter  Medications  . cephALEXin (KEFLEX) 250 MG capsule    Sig: Take 1 capsule (250 mg total) by mouth 4 (four) times daily for 10 days.    Dispense:  40 capsule    Refill:  [place], DO [place] [phone]

## 2017-06-12 NOTE — Patient Instructions (Signed)

## 2017-06-19 ENCOUNTER — Encounter: Payer: Self-pay | Admitting: Pediatrics

## 2017-06-19 ENCOUNTER — Ambulatory Visit: Payer: Medicare Other | Admitting: Pediatrics

## 2017-06-19 VITALS — BP 134/86 | HR 70 | Temp 98.4°F | Ht 74.0 in | Wt 272.0 lb

## 2017-06-19 DIAGNOSIS — I878 Other specified disorders of veins: Secondary | ICD-10-CM

## 2017-06-19 DIAGNOSIS — L03116 Cellulitis of left lower limb: Secondary | ICD-10-CM

## 2017-06-19 NOTE — Patient Instructions (Signed)
Keep legs elevated at heart level  Continue antibiotic.  Increase lasix to 40mg  daily

## 2017-06-19 NOTE — Progress Notes (Signed)
  Subjective:   Patient ID: [name], male    DOB: 08/25/1929, 84 y.o.   MRN: [id number] CC: Wound Infection (Left calf)  HPI: [name] is a 84 y.o. male   Initial wound on left lower extremity occurred about 4 to 5 weeks ago after he scraped it with a tree branch.  He was seen last week and started on Keflex to treat cellulitis.  He thinks it is somewhat better with the Keflex, he also thinks that he worsened the area putting bandages on it.  It is a season right now he has been outside a lot on his feet.  He has swelling in his lower extremities that improved by the morning.  He is taking Lasix 20 mg once a day in the morning.  He wears compression hose.  No fevers.  Normal appetite.  Relevant past medical, surgical, family and social history reviewed. Allergies and medications reviewed and updated. Social History   Tobacco Use  Smoking Status Never Smoker  Smokeless Tobacco Never Used   ROS: Per HPI   Objective:    BP 134/86   Pulse 70   Temp 98.4 F (36.9 C) (Oral)   Ht 6\' 2"  (1.88 m)   Wt 272 lb (123.4 kg)   BMI 34.92 kg/m   Wt Readings from Last 3 Encounters:  06/19/17 272 lb (123.4 kg)  06/12/17 273 lb (123.8 kg)  03/05/17 268 lb (121.6 kg)    Gen: NAD, alert, cooperative with exam, NCAT EYES: EOMI, no conjunctival injection, or no icterus CV: NRRR, normal S1/S2 Resp: CTABL, no wheezes, normal WOB Abd: +BS, soft, NTND. no guarding or organomegaly Ext: 2+ pitting edema bilaterally.  Neuro: Alert and oriented Skin: 8 cm x 1 cm and 6 cm x 1 cm skin tears upper left calf.  3 cm x 1 cm erosion mid left posterior calf.  Serous drainage present from the skin tears.  Assessment & Plan:  [name] was seen today for wound infection.  Diagnoses and all orders for this visit:  Venous stasis of lower extremity  Left leg cellulitis Continue Keflex.  Suspect skin tears are new.  Will increase Lasix to help with lower extremity swelling.  Discussed trying to stay off  leg and elevate it to level of the heart several times throughout the day.  Return to clinic in 1 week for recheck.  Low threshold to refer to wound care.   Follow up plan: Return in about 1 week (around 06/26/2017). Assunta Found, MD [place]

## 2017-06-25 ENCOUNTER — Encounter: Payer: Self-pay | Admitting: Family Medicine

## 2017-06-25 ENCOUNTER — Ambulatory Visit: Payer: Medicare Other | Admitting: Family Medicine

## 2017-06-25 VITALS — BP 136/74 | HR 76 | Temp 98.5°F | Ht 72.0 in | Wt 273.0 lb

## 2017-06-25 DIAGNOSIS — L97221 Non-pressure chronic ulcer of left calf limited to breakdown of skin: Secondary | ICD-10-CM

## 2017-06-25 DIAGNOSIS — I83022 Varicose veins of left lower extremity with ulcer of calf: Secondary | ICD-10-CM | POA: Diagnosis not present

## 2017-06-25 MED ORDER — DOXYCYCLINE HYCLATE 100 MG PO TABS: 100.0000 mg | ORAL_TABLET | Freq: Two times a day (BID) | ORAL | 0 refills | Status: DC

## 2017-06-25 NOTE — Patient Instructions (Signed)
I have added doxycycline to take in addition to the cephalexin.  Follow these directions:   Cephalexin: Take 1 capsule by mouth every 6 hours  Doxycycline: Take 1 tablet by mouth with food every 12 hours  Keep the sore on your leg clean with mild soap and water and apply a fresh bandage daily.  You will be contacted by the wound care specialist for an appointment  Make sure that you follow-up with Dr. Laurance Flatten as well.    Venous Ulcer A venous ulcer is a shallow sore on your lower leg. It is caused by poor circulation in your veins. Venous ulcer is the most common type of lower leg ulcer. You may have venous ulcers on one leg or on both legs. This condition most often develops around your ankles. This type of ulcer may last for a long time (chronic ulcer) or it may return often (recurrent ulcer). Follow these instructions at home: Wound care  Follow instructions from your doctor about: ? How to take care of your wound. ? When and how you should change your bandage (dressing). ? When you should remove your bandage. If your bandage is dry and gets stuck to your leg when you try to remove it, moisten or wet the bandage with saline solution or water. This helps you to remove it without harming your skin or wound.  Check your wound every day for signs of infection. Have a caregiver do this for you if you are not able to do it yourself. Watch for: ? More redness, swelling, or pain. ? More fluid or blood. ? Pus, warmth, or a bad smell. Medicines  Take over-the-counter and prescription medicines only as told by your doctor.  If you were prescribed an antibiotic medicine, take it or apply it as told by your doctor. Do not stop taking or using the antibiotic even if your condition improves. Activity  Do not stand or sit in one position for a long period of time. Rest with your legs raised during the day. If possible, keep your legs above your heart for 30 minutes, 3-4 times a day, or as told  by your doctor.  Do not sit with your legs crossed.  Walk often to increase the blood flow in your legs. Ask your doctor what level of activity is safe for you.  If you are taking a long ride in a car or plane, take a break to walk around at least once every two hours, or as told by your doctor. Ask your doctor if you should take aspirin before long trips. General instructions   Wear elastic stockings, compression stockings, or support hose as told by your doctor. This is very important.  Raise the foot of your bed as told by your doctor.  Do not smoke.  Keep all follow-up visits as told by your doctor. This is important. Contact a doctor if:  You have a fever.  Your ulcer is getting larger or is not healing.  Your pain gets worse.  You have more redness or swelling around your ulcer.  You have more fluid, blood, or pus coming from your ulcer after it has been cleaned by you or your doctor.  You have warmth or a bad smell coming from your ulcer. This information is not intended to replace advice given to you by your health care provider. Make sure you discuss any questions you have with your health care provider. Document Released: 03/01/2004 Document Revised: 06/30/2015 Document Reviewed: 06/02/2014 Elsevier Interactive  Patient Education  [name].

## 2017-06-25 NOTE — Progress Notes (Signed)
Subjective: CC: sore on leg PCP: Chipper Herb, MD TDV:VOHYW Chirico is a 84 y.o. male presenting to clinic today for:  1. Sore on leg Patient presents for follow-up on his left lower extremity wound that does not seem to be healing.  He was actually initially seen on 06/12/2017 and started on Keflex 4 times daily for 10 days.  He followed up about 1 week later for reevaluation.  At that time, he thought that the ulcer was getting better.  No changes to medications were made.  He follows up today noting that things seem to be getting worse now.  He notes pain over the ulcer site.  He reports that he has about 9 to 12 pills of his cephalexin left over.  He should have run out 3 days ago.  He does report that he forgets to take the medicines because he is so busy sometimes.  Denies any fevers, chills, purulence from the wound.  He has been keeping it bandaged and has continued wearing his compression hose.   ROS: Per HPI  No Known Allergies Past Medical History:  Diagnosis Date  . Hyperlipidemia   . Hypertension     Current Outpatient Medications:  .  clotrimazole-betamethasone (LOTRISONE) cream, APPLY TO AFFECTED AREA TWICE DAILY, Disp: 30 g, Rfl: 5 .  fish oil-omega-3 fatty acids 1000 MG capsule, Take 2 g by mouth daily.  , Disp: , Rfl:  .  fluticasone (FLONASE) 50 MCG/ACT nasal spray, Place 2 sprays into both nostrils daily., Disp: 16 g, Rfl: 6 .  furosemide (LASIX) 20 MG tablet, Take 1 tablet (20 mg total) by mouth 2 (two) times daily., Disp: 180 tablet, Rfl: 0 .  lisinopril (PRINIVIL,ZESTRIL) 40 MG tablet, Take 1 tablet (40 mg total) by mouth daily., Disp: 90 tablet, Rfl: 0 .  rosuvastatin (CRESTOR) 5 MG tablet, Take 0.5 tab on Monday, Wednesday and Friday evenings, Disp: 30 tablet, Rfl: 1 .  triamcinolone ointment (KENALOG) 0.5 %, Apply 1 application topically 2 (two) times daily., Disp: 30 g, Rfl: 0 .  Vitamin D, Ergocalciferol, (DRISDOL) 50000 units CAPS capsule, TAKE ONE  CAPSULE BY MOUTH every SEVEN DAYS, Disp: 12 capsule, Rfl: 0 Social History   Socioeconomic History  . Marital status: Single    Spouse name: Not on file  . Number of children: Not on file  . Years of education: Not on file  . Highest education level: Not on file  Occupational History  . Not on file  Social Needs  . Financial resource strain: Not on file  . Food insecurity:    Worry: Not on file    Inability: Not on file  . Transportation needs:    Medical: Not on file    Non-medical: Not on file  Tobacco Use  . Smoking status: Never Smoker  . Smokeless tobacco: Never Used  Substance and Sexual Activity  . Alcohol use: No  . Drug use: No  . Sexual activity: Never  Lifestyle  . Physical activity:    Days per week: Not on file    Minutes per session: Not on file  . Stress: Not on file  Relationships  . Social connections:    Talks on phone: Not on file    Gets together: Not on file    Attends religious service: Not on file    Active member of club or organization: Not on file    Attends meetings of clubs or organizations: Not on file    Relationship status:  Not on file  . Intimate partner violence:    Fear of current or ex partner: Not on file    Emotionally abused: Not on file    Physically abused: Not on file    Forced sexual activity: Not on file  Other Topics Concern  . Not on file  Social History Narrative  . Not on file   Family History  Problem Relation Age of Onset  . Cancer Mother   . Stroke Father   . Dementia Brother   . Healthy Daughter   . Healthy Son   . Healthy Son     Objective: Office vital signs reviewed. BP 136/74   Pulse 76   Temp 98.5 F (36.9 C) (Oral)   Ht 6' (1.829 m)   Wt 273 lb (123.8 kg)   BMI 37.03 kg/m   Physical Examination:  General: Awake, alert, nontoxic, No acute distress Skin: Patient with a horseshoe shaped area of skin breakdown.  This does seem to be increased from previous.  There is no increased warmth over  the area but there is quite a bit of induration.  He does have pitting edema throughout the lower extreme is bilaterally.  He has associated bruising and mild erythema surrounding the borders.  No palpable fluctuance.  No expressible exudate.    Assessment/ Plan: 84 y.o. male   1. Venous stasis ulcer of left calf limited to breakdown of skin, unspecified whether varicose veins present [facility]) Patient is afebrile and nontoxic-appearing with normal vital signs.  I have added doxycycline.  I reviewed with patient that he needs to take his Keflex 4 times daily until gone.  His wound was redressed and he was provided dressing for home as well.  I have referred him to wound care given poor healing.  Home care instructions reviewed.  Reasons for return discussed.  Follow-up with PCP as needed. - AMB referral to wound care center   Orders Placed This Encounter  Procedures  . AMB referral to wound care center    Referral Priority:   Routine    Referral Type:   Consultation    Number of Visits Requested:   1   Meds ordered this encounter  Medications  . doxycycline (VIBRA-TABS) 100 MG tablet    Sig: Take 1 tablet (100 mg total) by mouth 2 (two) times daily.    Dispense:  20 tablet    Refill:  [place], DO [place] [phone]

## 2017-06-28 ENCOUNTER — Encounter: Payer: Self-pay | Admitting: Family Medicine

## 2017-06-28 ENCOUNTER — Ambulatory Visit: Payer: Medicare Other | Admitting: Family Medicine

## 2017-06-28 VITALS — BP 150/84 | HR 78 | Temp 97.6°F | Ht 72.0 in | Wt 266.2 lb

## 2017-06-28 DIAGNOSIS — L97221 Non-pressure chronic ulcer of left calf limited to breakdown of skin: Secondary | ICD-10-CM

## 2017-06-28 DIAGNOSIS — I872 Venous insufficiency (chronic) (peripheral): Secondary | ICD-10-CM

## 2017-06-28 MED ORDER — MUPIROCIN 2 % EX OINT: 1.0000 "application " | TOPICAL_OINTMENT | Freq: Two times a day (BID) | CUTANEOUS | 1 refills | Status: DC

## 2017-06-28 NOTE — Progress Notes (Signed)
   HPI  Patient presents today with leg wound.  Patient explains that he feels that is actually getting a little bit better.  He is taking doxycycline twice daily now, he has 3 or 4 Keflex left.  He has been instructed to take Lasix twice daily by his PCP.  He reports good urine output.  He is also tolerating his compression stockings well.  He does not have an appointment at wound care scheduled yet.  PMH: Smoking status noted ROS: Per HPI  Objective: BP (!) 150/84   Pulse 78   Temp 97.6 F (36.4 C) (Oral)   Ht 6' (1.829 m)   Wt 266 lb 3.2 oz (120.7 kg)   BMI 36.10 kg/m  Gen: NAD, alert, cooperative with exam HEENT: NCAT CV: RRR, good S1/S2, no murmur Resp: CTABL, no wheezes, non-labored Ext: No edema, warm Neuro: Alert and oriented, No gross deficits      Assessment and plan:  #Venous stasis ulcer Continues to improve, no warmth purulent drainage or areas that appear to be worsening.  Continue doxycycline, okay to finish Keflex. Has a referral placed for wound care Recommended daily changes Mupirocin ointment applied with bandage change. conmtinue this at least daily     Meds ordered this encounter  Medications  . mupirocin ointment (BACTROBAN) 2 %    Sig: Apply 1 application topically 2 (two) times daily.    Dispense:  22 g    Refill:  [place], MD [facility] 06/28/2017, 6:01 PM

## 2017-07-04 ENCOUNTER — Ambulatory Visit (INDEPENDENT_AMBULATORY_CARE_PROVIDER_SITE_OTHER): Payer: Medicare Other | Admitting: *Deleted

## 2017-07-04 VITALS — BP 132/78 | HR 59 | Temp 96.8°F | Ht 72.0 in | Wt 266.0 lb

## 2017-07-04 DIAGNOSIS — Z0000 Encounter for general adult medical examination without abnormal findings: Secondary | ICD-10-CM

## 2017-07-04 NOTE — Patient Instructions (Signed)
  Mr. Sattar , Thank you for taking time to come for your Medicare Wellness Visit. I appreciate your ongoing commitment to your health goals. Please review the following plan we discussed and let me know if I can assist you in the future.   These are the goals we discussed: Goals    . Prevent falls     Stay active        This is a list of the screening recommended for you and due dates:  Health Maintenance  Topic Date Due  . Pneumonia vaccines (2 of 2 - PPSV23) 03/05/2018*  . Flu Shot  09/05/2017  . Tetanus Vaccine  05/07/2019  *Topic was postponed. The date shown is not the original due date.    Keep follow up with DR Laurance Flatten  We will plan to so a EKG and CXR at your visit with DR Laurance Flatten.

## 2017-07-04 NOTE — Progress Notes (Addendum)
Subjective:   [name] is a 84 y.o. male who presents for Medicare Annual/Subsequent preventive examination. 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 He enjoys hunting, but doesn't get to go very often. He states that the only exercise he gets is working. He says that he eats semi-healthy and eats a lot of vegetables. He attends church sometimes, but not every Sunday. He lives at home alone and has three children, 2 sons and 1 daughter. Two of the Three children live local. He does not have any pets and fall hazards were discussed today. He states that his health is about the same as a year ago.   Cardiac Risk Factors include: male gender;advanced age (>43men, >51 women);hypertension     Objective:    Vitals: BP 132/78   Pulse (!) 59   Temp (!) 96.8 F (36 C) (Oral)   Ht 6' (1.829 m)   Wt 266 lb (120.7 kg)   BMI 36.08 kg/m   Body mass index is 36.08 kg/m.  Advanced Directives 07/04/2017 07/18/2016  Does Patient Have a Medical Advance Directive? Yes Yes  Type of Advance Directive Living will;Healthcare Power of [place];Living will  Copy of [place] in Chart? No - copy requested -    Tobacco Social History   Tobacco Use  Smoking Status Never Smoker  Smokeless Tobacco Never Used     Counseling given: Not Answered never used tobacco products  Past Medical History:  Diagnosis Date  . Hyperlipidemia   . Hypertension    Past Surgical History:  Procedure Laterality Date  . JOINT REPLACEMENT Left    hip    Family History  Problem Relation Age of Onset  . Cancer Mother   . Stroke Father   . Dementia Brother   . Healthy Daughter   . Healthy Son   . Healthy Son    Social History   Socioeconomic History  . Marital status: Single    Spouse name: Not on file  . Number of children: Not on file  . Years of education: Not on file  . Highest education level: Not on file  Occupational History  .  Not on file  Social Needs  . Financial resource strain: Not on file  . Food insecurity:    Worry: Not on file    Inability: Not on file  . Transportation needs:    Medical: Not on file    Non-medical: Not on file  Tobacco Use  . Smoking status: Never Smoker  . Smokeless tobacco: Never Used  Substance and Sexual Activity  . Alcohol use: No  . Drug use: No  . Sexual activity: Never  Lifestyle  . Physical activity:    Days per week: Not on file    Minutes per session: Not on file  . Stress: Not on file  Relationships  . Social connections:    Talks on phone: Not on file    Gets together: Not on file    Attends religious service: Not on file    Active member of club or organization: Not on file    Attends meetings of clubs or organizations: Not on file    Relationship status: Not on file  Other Topics Concern  . Not on file  Social History Narrative  . Not on file    Outpatient Encounter Medications as of 07/04/2017  Medication Sig  . clotrimazole-betamethasone (LOTRISONE) cream APPLY TO AFFECTED AREA TWICE DAILY  .  doxycycline (VIBRA-TABS) 100 MG tablet Take 1 tablet (100 mg total) by mouth 2 (two) times daily.  . fish oil-omega-3 fatty acids 1000 MG capsule Take 2 g by mouth daily.    . fluticasone (FLONASE) 50 MCG/ACT nasal spray Place 2 sprays into both nostrils daily.  . furosemide (LASIX) 20 MG tablet Take 1 tablet (20 mg total) by mouth 2 (two) times daily.  Marland Kitchen lisinopril (PRINIVIL,ZESTRIL) 40 MG tablet Take 1 tablet (40 mg total) by mouth daily.  . mupirocin ointment (BACTROBAN) 2 % Apply 1 application topically 2 (two) times daily.  . rosuvastatin (CRESTOR) 5 MG tablet Take 0.5 tab on Monday, Wednesday and Friday evenings  . triamcinolone ointment (KENALOG) 0.5 % Apply 1 application topically 2 (two) times daily.  . Vitamin D, Ergocalciferol, (DRISDOL) 50000 units CAPS capsule TAKE ONE CAPSULE BY MOUTH every SEVEN DAYS   No facility-administered encounter  medications on file as of 07/04/2017.     Activities of Daily Living In your present state of health, do you have any difficulty performing the following activities: 07/04/2017  Hearing? N  Vision? N  Difficulty concentrating or making decisions? N  Walking or climbing stairs? N  Dressing or bathing? N  Doing errands, shopping? N  Preparing Food and eating ? N  Using the Toilet? N  In the past six months, have you accidently leaked urine? N  Do you have problems with loss of bowel control? N  Managing your Medications? N  Managing your Finances? N  Housekeeping or managing your Housekeeping? N  Some recent data might be hidden    Patient Care Team: Chipper Herb, MD as PCP - General (Family Medicine)   Assessment:   This is a routine wellness examination for [name].  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 Prevent falls     Stay active        Fall Risk Fall Risk  07/04/2017 06/28/2017 06/25/2017 06/19/2017 06/12/2017  Falls in the past year? No No No No No   Is the patient's home free of loose throw rugs in walkways, pet beds, electrical cords, etc? Fall hazards and risks were discussed today.  Depression Screen PHQ 2/9 Scores 07/04/2017 06/28/2017 06/25/2017 06/19/2017  PHQ - 2 Score 0 0 0 0    Cognitive Function MMSE - Mini Mental State Exam 07/04/2017 12/16/2015  Orientation to time 5 5  Orientation to Place 5 5  Registration 3 3  Attention/ Calculation 5 0  Recall 1 0  Language- name 2 objects 2 2  Language- repeat 1 1  Language- follow 3 step command 3 3  Language- read & follow direction 1 1  Write a sentence 1 1  Copy design 1 1  Total score 28 22        Immunization History    Administered Date(s) Administered  . DTaP 06/02/2009  . Influenza Whole 12/06/2009  . Influenza, High Dose Seasonal PF 12/07/2015, 11/26/2016  . Influenza,inj,Quad PF,6+ Mos 01/02/2013, 01/06/2014, 01/10/2015  . Pneumococcal Conjugate-13 02/10/2015    Qualifies for Shingles Vaccine? Declined today   Screening Tests Health Maintenance  Topic Date Due  . PNA vac Low Risk Adult (2 of 2 - PPSV23) 03/05/2018 (Originally 02/10/2016)  . INFLUENZA VACCINE  09/05/2017  . TETANUS/TDAP  05/07/2019   Cancer Screenings: Lung: Low Dose CT Chest recommended if Age 20-80 years, 30 pack-year currently smoking OR have quit w/in 15years. Patient does not qualify. Colorectal:due at next OV   Additional Screenings: declined  Hepatitis C Screening:      Plan:   pt is to keep follow up with Dr Laurance Flatten (07/18/17) He will try to prevent falls and stay active. He had a wound on left lower leg, that was re-dressed today. Area was clean and dry. He is due for a CXR and EKG, this will be Terry on 6/13-OV.   I have personally reviewed and noted the following in the patient's chart:   . Medical and social history . Use of alcohol, tobacco or illicit drugs  . Current medications and supplements . Functional ability and status . Nutritional status . Physical activity . Advanced directives . List of other physicians . Hospitalizations, surgeries, and ER visits in previous 12 months . Vitals . Screenings to include cognitive, depression, and falls . Referrals and appointments  In addition, I have reviewed and discussed with patient certain preventive protocols, quality metrics, and best practice recommendations. A written personalized care plan for preventive services as well as general preventive health recommendations were provided to patient.     [name], Cameron Proud, LPN  [id number]  I have reviewed and agree with the above AWV documentation.   Tyler Hassell Done, FNP

## 2017-07-18 ENCOUNTER — Ambulatory Visit (INDEPENDENT_AMBULATORY_CARE_PROVIDER_SITE_OTHER): Payer: Medicare Other

## 2017-07-18 ENCOUNTER — Ambulatory Visit (INDEPENDENT_AMBULATORY_CARE_PROVIDER_SITE_OTHER): Payer: Medicare Other | Admitting: Family Medicine

## 2017-07-18 ENCOUNTER — Encounter: Payer: Self-pay | Admitting: Family Medicine

## 2017-07-18 VITALS — BP 136/76 | HR 61 | Temp 97.0°F | Ht 72.0 in | Wt 267.0 lb

## 2017-07-18 DIAGNOSIS — E559 Vitamin D deficiency, unspecified: Secondary | ICD-10-CM | POA: Diagnosis not present

## 2017-07-18 DIAGNOSIS — N400 Enlarged prostate without lower urinary tract symptoms: Secondary | ICD-10-CM | POA: Diagnosis not present

## 2017-07-18 DIAGNOSIS — I10 Essential (primary) hypertension: Secondary | ICD-10-CM

## 2017-07-18 DIAGNOSIS — E7800 Pure hypercholesterolemia, unspecified: Secondary | ICD-10-CM | POA: Diagnosis not present

## 2017-07-18 DIAGNOSIS — I878 Other specified disorders of veins: Secondary | ICD-10-CM | POA: Diagnosis not present

## 2017-07-18 DIAGNOSIS — E669 Obesity, unspecified: Secondary | ICD-10-CM | POA: Insufficient documentation

## 2017-07-18 DIAGNOSIS — B356 Tinea cruris: Secondary | ICD-10-CM

## 2017-07-18 NOTE — Progress Notes (Signed)
Subjective:    Patient ID: [name], male    DOB: 11/04/1929, 84 y.o.   MRN: [id number]  HPI Pt here for follow up and management of chronic medical problems which includes hypertension and hyperlipidemia. He is taking medication regularly.  The patient is doing well and as usual has no complaints.  He is jovial positive and looks much younger than his stated age of 35 years.  He will get a chest x-ray today and lab work today along with an EKG urinalysis and rectal exam.  His vital signs are stable.  His BMI is 36.08.  With his history of hyperlipidemia and hypertension this would put him in the morbid obese category.  The patient's father died of a stroke and his mother had cancer.  He has several siblings one has dementia and the other meds are healthy.  The patient is taking his Crestor vitamin D replacement along with Lasix and lisinopril for blood pressure control and edema control.  The patient is always positive and likes to stay busy and active despite his age.  He says he has good genetics in his family members have lived to be an older age.  He does understand the importance of his age he has to be careful in the heat.  He denies any chest pain pressure tightness or palpitations.  He denies any trouble with shortness of breath.  He denies any trouble with his intestinal tract including abdominal pain swallowing problems nausea vomiting diarrhea blood in the stool black tarry bowel movements or change in bowel habits.  He is passing his water without problems.  He is jovial and in good spirits today.    Patient Active Problem List   Diagnosis Date Noted  . Essential hypertension 08/16/2014  . Hyperlipemia 08/16/2014  . Peripheral edema 06/10/2012  . Hypertension 05/10/2010  . Vitamin D deficiency 05/10/2010  . Arthritis 05/10/2010   Outpatient Encounter Medications as of 07/18/2017  Medication Sig  . clotrimazole-betamethasone (LOTRISONE) cream APPLY TO AFFECTED AREA TWICE DAILY    . doxycycline (VIBRA-TABS) 100 MG tablet Take 1 tablet (100 mg total) by mouth 2 (two) times daily.  . fish oil-omega-3 fatty acids 1000 MG capsule Take 2 g by mouth daily.    . fluticasone (FLONASE) 50 MCG/ACT nasal spray Place 2 sprays into both nostrils daily.  . furosemide (LASIX) 20 MG tablet Take 1 tablet (20 mg total) by mouth 2 (two) times daily.  Marland Kitchen lisinopril (PRINIVIL,ZESTRIL) 40 MG tablet Take 1 tablet (40 mg total) by mouth daily.  . mupirocin ointment (BACTROBAN) 2 % Apply 1 application topically 2 (two) times daily.  . rosuvastatin (CRESTOR) 5 MG tablet Take 0.5 tab on Monday, Wednesday and Friday evenings  . triamcinolone ointment (KENALOG) 0.5 % Apply 1 application topically 2 (two) times daily.  . Vitamin D, Ergocalciferol, (DRISDOL) 50000 units CAPS capsule TAKE ONE CAPSULE BY MOUTH every SEVEN DAYS   No facility-administered encounter medications on file as of 07/18/2017.       Review of Systems  Constitutional: Negative.   HENT: Negative.   Eyes: Negative.   Respiratory: Negative.   Cardiovascular: Negative.   Gastrointestinal: Negative.   Endocrine: Negative.   Genitourinary: Negative.   Musculoskeletal: Negative.   Skin: Negative.   Allergic/Immunologic: Negative.   Neurological: Negative.   Hematological: Negative.   Psychiatric/Behavioral: Negative.        Objective:   Physical Exam  Constitutional: He is oriented to person, place, and time. He appears well-developed  and well-nourished. No distress.  The patient is pleasant and relaxed  HENT:  Head: Normocephalic and atraumatic.  Right Ear: External ear normal.  Left Ear: External ear normal.  Nose: Nose normal.  Mouth/Throat: Oropharynx is clear and moist. No oropharyngeal exudate.  Eyes: Pupils are equal, round, and reactive to light. Conjunctivae and EOM are normal. Right eye exhibits no discharge. Left eye exhibits no discharge.  I reminded the patient that it is important to get eye exams done  regularly.  Neck: Normal range of motion. Neck supple. No thyromegaly present.  No bruits thyromegaly or anterior cervical adenopathy  Cardiovascular: Normal rate and normal heart sounds.  No murmur heard. Heart was slightly irregular at 60/min, distal pulses were difficult to palpate.  Pulmonary/Chest: Effort normal and breath sounds normal. No respiratory distress. He has no wheezes. He has no rales. He exhibits no tenderness.  Clear anteriorly and posteriorly and no axillary adenopathy.  No chest wall masses or tenderness  Abdominal: Soft. Bowel sounds are normal. He exhibits no mass. There is no tenderness. There is no guarding. No hernia.  Abdominal obesity without liver or spleen enlargement epigastric tenderness masses bruits or inguinal adenopathy.  Genitourinary: Rectum normal and penis normal.  Genitourinary Comments: Prostate was slightly enlarged without lumps or masses.  The rectal exam was negative for masses in the external genitalia were normal  Musculoskeletal: Normal range of motion. He exhibits edema.  The patient has venous insufficiency but does wear support hose regularly which helps control this.  Also takes Lasix 20 mg twice daily.  He did have some pedal edema and slight pretibial edema today even with a hose on.  Lymphadenopathy:    He has no cervical adenopathy.  Neurological: He is alert and oriented to person, place, and time. He has normal reflexes. No cranial nerve deficit.  Diminished reflexes bilaterally.  Skin: Skin is warm and dry. Rash noted. There is erythema.  The patient did have a growing rash bilaterally which looks like yeast and he will use Monistat cream over-the-counter to apply to this.  Psychiatric: He has a normal mood and affect. His behavior is normal. Judgment and thought content normal.  Patient's mood affect and behavior appeared normal for  Nursing note and vitals reviewed.  BP 136/76 (BP Location: Left Arm)   Pulse 61   Temp (!) 97 F  (36.1 C) (Oral)   Ht 6' (1.829 m)   Wt 267 lb (121.1 kg)   BMI 36.21 kg/m    EKG with results pending===     Assessment & Plan:  1. Essential hypertension -Blood pressure is good today and he will continue with current treatment and sodium restriction and all efforts to lose weight - BMP8+EGFR - CBC with Differential/Platelet - Hepatic function panel - DG Chest 2 View; Future - EKG 12-Lead  2. Pure hypercholesterolemia -Continue with current treatment and aggressive therapeutic lifestyle changes pending results of lab work - CBC with Differential/Platelet - Lipid panel - DG Chest 2 View; Future - EKG 12-Lead  3. Vitamin D deficiency -Continue with current treatment pending results of lab work - CBC with Differential/Platelet - VITAMIN D 25 Hydroxy (Vit-D Deficiency, Fractures)  4. Benign prostatic hyperplasia without lower urinary tract symptoms -Prostate remains enlarged but soft without lumps or masses and no rectal masses and patient has no complaints with voiding - CBC with Differential/Platelet - Urinalysis, Complete  5. Venous stasis of lower extremity -Stasis ulcers have healed and he will continue wearing support hose  daily  6. Morbid obesity ([place]) -Continue to work aggressively on weight loss through diet and exercise  7. Tinea cruris -The patient can apply Monistat vaginal cream to these areas twice daily in the groin area.  Patient Instructions                       Medicare Annual Wellness Visit  [facility] and the medical providers at [facility] strive to bring you the best medical care.  In doing so we not only want to address your current medical conditions and concerns but also to detect new conditions early and prevent illness, disease and health-related problems.    Medicare offers a yearly Wellness Visit which allows our clinical staff to assess your need for preventative services including immunizations, lifestyle  education, counseling to decrease risk of preventable diseases and screening for fall risk and other medical concerns.    This visit is provided free of charge (no copay) for all Medicare recipients. The clinical pharmacists at [facility] have begun to conduct these Wellness Visits which will also include a thorough review of all your medications.    As you primary medical provider recommend that you make an appointment for your Annual Wellness Visit if you have not done so already this year.  You may set up this appointment before you leave today or you may call back ([phone]) and schedule an appointment.  Please make sure when you call that you mention that you are scheduling your Annual Wellness Visit with the clinical pharmacist so that the appointment may be made for the proper length of time.     Continue current medications. Continue good therapeutic lifestyle changes which include good diet and exercise. Fall precautions discussed with patient. If an FOBT was given today- please return it to our front desk. If you are over 75 years old - you may need Prevnar 2 or the adult Pneumonia vaccine.  **Flu shots are available--- please call and schedule a FLU-CLINIC appointment**  After your visit with [place] today you will receive a survey in the mail or online from [employer] regarding your care with [place]. Please take a moment to fill this out. Your feedback is very important to [place] as you can help [place] better understand your patient needs as well as improve your experience and satisfaction. WE CARE ABOUT YOU!!!   Continue to work aggressively on weight through diet and exercise This summer drink plenty of fluids and avoid getting overactive in the excessive heat We will call with lab work results including chest x-ray urinalysis as soon as those results become available Stay active physically and keep self checked regularly for tics and tick bites.  Use Monistat vaginal  cream to grow 1 rash twice daily Continue to use support stockings regularly and put these on the first thing with arising from the bed in the morning Follow-up with dermatology for regular skin checks It is always important to get an eye exam periodically at least every 2 years  Arrie Senate MD

## 2017-07-18 NOTE — Patient Instructions (Addendum)
Medicare Annual Wellness Visit  [facility] and the medical providers at [facility] strive to bring you the best medical care.  In doing so we not only want to address your current medical conditions and concerns but also to detect new conditions early and prevent illness, disease and health-related problems.    Medicare offers a yearly Wellness Visit which allows our clinical staff to assess your need for preventative services including immunizations, lifestyle education, counseling to decrease risk of preventable diseases and screening for fall risk and other medical concerns.    This visit is provided free of charge (no copay) for all Medicare recipients. The clinical pharmacists at [facility] have begun to conduct these Wellness Visits which will also include a thorough review of all your medications.    As you primary medical provider recommend that you make an appointment for your Annual Wellness Visit if you have not done so already this year.  You may set up this appointment before you leave today or you may call back ([phone]) and schedule an appointment.  Please make sure when you call that you mention that you are scheduling your Annual Wellness Visit with the clinical pharmacist so that the appointment may be made for the proper length of time.     Continue current medications. Continue good therapeutic lifestyle changes which include good diet and exercise. Fall precautions discussed with patient. If an FOBT was given today- please return it to our front desk. If you are over 28 years old - you may need Prevnar 70 or the adult Pneumonia vaccine.  **Flu shots are available--- please call and schedule a FLU-CLINIC appointment**  After your visit with [place] today you will receive a survey in the mail or online from [employer] regarding your care with [place]. Please take a moment to fill this out. Your feedback is very  important to [place] as you can help [place] better understand your patient needs as well as improve your experience and satisfaction. WE CARE ABOUT YOU!!!   Continue to work aggressively on weight through diet and exercise This summer drink plenty of fluids and avoid getting overactive in the excessive heat We will call with lab work results including chest x-ray urinalysis as soon as those results become available Stay active physically and keep self checked regularly for tics and tick bites.  Use Monistat vaginal cream to grow 1 rash twice daily Continue to use support stockings regularly and put these on the first thing with arising from the bed in the morning Follow-up with dermatology for regular skin checks It is always important to get an eye exam periodically at least every 2 years

## 2017-07-19 LAB — HEPATIC FUNCTION PANEL
ALT: 12 IU/L (ref 0–44)
AST: 17 IU/L (ref 0–40)
Albumin: 3.8 g/dL (ref 3.5–4.7)
Alkaline Phosphatase: 60 IU/L (ref 39–117)
Bilirubin Total: 0.4 mg/dL (ref 0.0–1.2)
Bilirubin, Direct: 0.15 mg/dL (ref 0.00–0.40)
Total Protein: 6.1 g/dL (ref 6.0–8.5)

## 2017-07-19 LAB — CBC WITH DIFFERENTIAL/PLATELET
Basophils Absolute: 0 10*3/uL (ref 0.0–0.2)
Basos: 0 %
EOS (ABSOLUTE): 0.1 10*3/uL (ref 0.0–0.4)
Eos: 1 %
Hematocrit: 45.6 % (ref 37.5–51.0)
Hemoglobin: 15.2 g/dL (ref 13.0–17.7)
Immature Grans (Abs): 0 10*3/uL (ref 0.0–0.1)
Immature Granulocytes: 0 %
Lymphocytes Absolute: 2.3 10*3/uL (ref 0.7–3.1)
Lymphs: 30 %
MCH: 31.1 pg (ref 26.6–33.0)
MCHC: 33.3 g/dL (ref 31.5–35.7)
MCV: 93 fL (ref 79–97)
Monocytes Absolute: 0.8 10*3/uL (ref 0.1–0.9)
Monocytes: 10 %
Neutrophils Absolute: 4.5 10*3/uL (ref 1.4–7.0)
Neutrophils: 59 %
Platelets: 257 10*3/uL (ref 150–450)
RBC: 4.89 x10E6/uL (ref 4.14–5.80)
RDW: 12.9 % (ref 12.3–15.4)
WBC: 7.7 10*3/uL (ref 3.4–10.8)

## 2017-07-19 LAB — BMP8+EGFR
BUN/Creatinine Ratio: 13 (ref 10–24)
BUN: 14 mg/dL (ref 8–27)
CO2: 24 mmol/L (ref 20–29)
Calcium: 9.1 mg/dL (ref 8.6–10.2)
Chloride: 100 mmol/L (ref 96–106)
Creatinine, Ser: 1.1 mg/dL (ref 0.76–1.27)
GFR calc Af Amer: 70 mL/min/{1.73_m2} (ref >59)
GFR calc non Af Amer: 60 mL/min/{1.73_m2} (ref >59)
Glucose: 80 mg/dL (ref 65–99)
Potassium: 4.5 mmol/L (ref 3.5–5.2)
Sodium: 139 mmol/L (ref 134–144)

## 2017-07-19 LAB — LIPID PANEL
Chol/HDL Ratio: 3.4 ratio (ref 0.0–5.0)
Cholesterol, Total: 185 mg/dL (ref 100–199)
HDL: 54 mg/dL (ref >39)
LDL Calculated: 107 mg/dL — ABNORMAL HIGH (ref 0–99)
Triglycerides: 122 mg/dL (ref 0–149)
VLDL Cholesterol Cal: 24 mg/dL (ref 5–40)

## 2017-07-19 LAB — VITAMIN D 25 HYDROXY (VIT D DEFICIENCY, FRACTURES): Vit D, 25-Hydroxy: 43.5 ng/mL (ref 30.0–100.0)

## 2017-08-12 ENCOUNTER — Other Ambulatory Visit: Payer: Self-pay | Admitting: Family Medicine

## 2017-09-03 ENCOUNTER — Other Ambulatory Visit: Payer: Self-pay | Admitting: Family Medicine

## 2017-09-03 DIAGNOSIS — I10 Essential (primary) hypertension: Secondary | ICD-10-CM

## 2017-09-03 DIAGNOSIS — I878 Other specified disorders of veins: Secondary | ICD-10-CM

## 2017-10-14 ENCOUNTER — Telehealth: Payer: Self-pay | Admitting: *Deleted

## 2017-10-14 MED ORDER — AMOXICILLIN-POT CLAVULANATE 875-125 MG PO TABS: 1.0000 | ORAL_TABLET | Freq: Two times a day (BID) | ORAL | 0 refills | Status: DC

## 2017-10-14 NOTE — Telephone Encounter (Signed)
I will go ahead and start him on Augmentin 875 twice daily with food for 10 days and if he does not get better he needs to come in to be seen.  He should use nasal saline frequently and drink plenty of fluids and wear a mask if he is out mowing

## 2017-10-14 NOTE — Telephone Encounter (Signed)
Patient is complaining with a sinus infection and he would like antibiotic sent into [facility]

## 2017-10-14 NOTE — Telephone Encounter (Signed)
Pt aware med sent in

## 2017-10-16 DIAGNOSIS — L570 Actinic keratosis: Secondary | ICD-10-CM | POA: Diagnosis not present

## 2017-11-08 ENCOUNTER — Telehealth: Payer: Self-pay | Admitting: Family Medicine

## 2017-11-08 MED ORDER — AZITHROMYCIN 250 MG PO TABS: ORAL_TABLET | ORAL | 0 refills | Status: DC

## 2017-11-08 NOTE — Telephone Encounter (Signed)
Can pt have a antibiotic sent to [facility]?   [name] is back up for [employer]

## 2017-11-08 NOTE — Telephone Encounter (Signed)
Pt aware.

## 2017-11-08 NOTE — Telephone Encounter (Signed)
Zpak Prescription sent to pharmacy   

## 2017-11-08 NOTE — Telephone Encounter (Signed)
What symptoms do you have? Congestion and coughing up stuff   How long have you been sick? Over a week  Have you been seen for this problem? No he has tried OTC wants something stronger   If your provider decides to give you a prescription, which pharmacy would you like for it to be sent to? [facility]   Patient informed that this information will be sent to the clinical staff for review and that they should receive a follow up call.

## 2017-11-11 ENCOUNTER — Other Ambulatory Visit: Payer: Self-pay | Admitting: Family Medicine

## 2017-11-25 DIAGNOSIS — Z85828 Personal history of other malignant neoplasm of skin: Secondary | ICD-10-CM | POA: Diagnosis not present

## 2017-11-25 DIAGNOSIS — L570 Actinic keratosis: Secondary | ICD-10-CM | POA: Diagnosis not present

## 2018-01-15 ENCOUNTER — Ambulatory Visit: Payer: Medicare Other | Admitting: Family Medicine

## 2018-01-20 ENCOUNTER — Other Ambulatory Visit: Payer: Self-pay | Admitting: Family Medicine

## 2018-01-20 NOTE — Telephone Encounter (Signed)
OV 03/05/18

## 2018-02-11 ENCOUNTER — Other Ambulatory Visit: Payer: Self-pay | Admitting: Family Medicine

## 2018-03-05 ENCOUNTER — Ambulatory Visit (INDEPENDENT_AMBULATORY_CARE_PROVIDER_SITE_OTHER): Payer: Medicare Other | Admitting: Family Medicine

## 2018-03-05 ENCOUNTER — Encounter: Payer: Self-pay | Admitting: Family Medicine

## 2018-03-05 VITALS — BP 157/77 | HR 60 | Temp 96.7°F | Ht 72.0 in | Wt 269.0 lb

## 2018-03-05 DIAGNOSIS — E7800 Pure hypercholesterolemia, unspecified: Secondary | ICD-10-CM | POA: Diagnosis not present

## 2018-03-05 DIAGNOSIS — H6123 Impacted cerumen, bilateral: Secondary | ICD-10-CM | POA: Diagnosis not present

## 2018-03-05 DIAGNOSIS — Z23 Encounter for immunization: Secondary | ICD-10-CM | POA: Diagnosis not present

## 2018-03-05 DIAGNOSIS — E559 Vitamin D deficiency, unspecified: Secondary | ICD-10-CM | POA: Diagnosis not present

## 2018-03-05 DIAGNOSIS — I878 Other specified disorders of veins: Secondary | ICD-10-CM

## 2018-03-05 DIAGNOSIS — I10 Essential (primary) hypertension: Secondary | ICD-10-CM

## 2018-03-05 MED ORDER — FUROSEMIDE 20 MG PO TABS: ORAL_TABLET | ORAL | 3 refills | Status: DC

## 2018-03-05 NOTE — Patient Instructions (Addendum)
Medicare Annual Wellness Visit  [facility] and the medical providers at [facility] strive to bring you the best medical care.  In doing so we not only want to address your current medical conditions and concerns but also to detect new conditions early and prevent illness, disease and health-related problems.    Medicare offers a yearly Wellness Visit which allows our clinical staff to assess your need for preventative services including immunizations, lifestyle education, counseling to decrease risk of preventable diseases and screening for fall risk and other medical concerns.    This visit is provided free of charge (no copay) for all Medicare recipients. The clinical pharmacists at [facility] have begun to conduct these Wellness Visits which will also include a thorough review of all your medications.    As you primary medical provider recommend that you make an appointment for your Annual Wellness Visit if you have not done so already this year.  You may set up this appointment before you leave today or you may call back ([phone]) and schedule an appointment.  Please make sure when you call that you mention that you are scheduling your Annual Wellness Visit with the clinical pharmacist so that the appointment may be made for the proper length of time.    Continue current medications. Continue good therapeutic lifestyle changes which include good diet and exercise. Fall precautions discussed with patient. If an FOBT was given today- please return it to our front desk. If you are over 36 years old - you may need Prevnar 11 or the adult Pneumonia vaccine.  **Flu shots are available--- please call and schedule a FLU-CLINIC appointment**  After your visit with [place] today you will receive a survey in the mail or online from [employer] regarding your care with [place]. Please take a moment to fill this out. Your feedback is very  important to [place] as you can help [place] better understand your patient needs as well as improve your experience and satisfaction. WE CARE ABOUT YOU!!!   Continue to leave the wallet out while sitting on hard surfaces. Avoid ibuprofen Motrin and Aleve and only take Tylenol arthritis if needed for pain NSAIDs like Motrin can cause blood pressure to go up irritate the stomach and cause more edema in the legs. Use warm wet compresses 20 minutes 3 or 4 times daily to the area of pain Watch sodium intake Continue to be careful and do not put yourself at risk for falling After couple weeks of leaving off the ibuprofen, please come by and have the nurse to recheck your blood pressure at that time. Do not forget to get a good eye exam and make sure we get a copy of that report

## 2018-03-05 NOTE — Progress Notes (Signed)
Subjective:    Patient ID: [name], male    DOB: 1929/10/28, 85 y.o.   MRN: [id number]  HPI Pt here for follow up and management of chronic medical problems which includes hypertension and hyperlipidemia. He is taking medication regularly.  Patient is doing well overall he does complain of some right low back pain.  He needs a refill on his Lasix.  He has not had his flu shot he was given an FOBT to return and will get lab work today.  The patient has hypertension morbid obesity based upon 2 or more comorbid risk factors along with a BMI that is greater than 35.  He has vitamin D deficiency hypertension and hyperlipidemia.  He also has osteoarthritis.  After reviewing his record there is no LS spine films noted.  Blood pressure was up on 2 accounts today.  The patient is now 85 years old but looks much younger than that.  The patient is pleasant and alert and as mentioned and looks much younger than his stated age.  He has been taken ibuprofen for his back and hip pain.  We offered some x-rays today but he would like to wait on that.  He has not had the flu shot so we will make sure he gets that before he leaves today.  He denies any chest pain pressure tightness or shortness of breath.  He denies any trouble with swallowing nausea vomiting diarrhea blood in the stool or change in bowel habits.  He has only rare heartburn.  He is passing his water well.  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  He will change to Tylenol arthritis for pain and use moist heat on his back and hip.   Patient Active Problem List   Diagnosis Date Noted  . Morbid obesity ([place]) 07/18/2017  . Essential hypertension 08/16/2014  . Hyperlipemia 08/16/2014  . Peripheral edema 06/10/2012  . Hypertension 05/10/2010  . Vitamin D deficiency 05/10/2010  . Arthritis 05/10/2010   Outpatient Encounter Medications as of 03/05/2018  Medication Sig  . clotrimazole-betamethasone (LOTRISONE) cream APPLY TO AFFECTED AREA  TWICE DAILY  . fish oil-omega-3 fatty acids 1000 MG capsule Take 2 g by mouth daily.    . fluticasone (FLONASE) 50 MCG/ACT nasal spray Place 2 sprays into both nostrils daily.  . furosemide (LASIX) 20 MG tablet TAKE  (1)  TABLET TWICE A DAY.  Marland Kitchen lisinopril (PRINIVIL,ZESTRIL) 40 MG tablet TAKE 1 TABLET DAILY  . mupirocin ointment (BACTROBAN) 2 % Apply 1 application topically 2 (two) times daily.  . rosuvastatin (CRESTOR) 5 MG tablet Take 0.5 tab on Monday, Wednesday and Friday evenings  . triamcinolone ointment (KENALOG) 0.5 % Apply 1 application topically 2 (two) times daily.  . Vitamin D, Ergocalciferol, (DRISDOL) 1.25 MG (50000 UT) CAPS capsule TAKE 1 CAPSULE ONCE A WEEK  . [DISCONTINUED] amoxicillin-clavulanate (AUGMENTIN) 875-125 MG tablet Take 1 tablet by mouth 2 (two) times daily.  . [DISCONTINUED] azithromycin (ZITHROMAX) 250 MG tablet Take 500 mg once, then 250 mg for four days   No facility-administered encounter medications on file as of 03/05/2018.      Review of Systems  Constitutional: Negative.   HENT: Negative.   Eyes: Negative.   Respiratory: Negative.   Cardiovascular: Negative.   Gastrointestinal: Negative.   Endocrine: Negative.   Genitourinary: Negative.   Musculoskeletal: Negative.   Skin: Negative.   Allergic/Immunologic: Negative.   Neurological: Negative.   Hematological: Negative.   Psychiatric/Behavioral: Negative.  Objective:   Physical Exam Vitals signs and nursing note reviewed.  Constitutional:      General: He is not in acute distress.    Appearance: Normal appearance. He is well-developed. He is obese.     Comments: Is pleasant and alert and overweight and appears much younger than his stated age.  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  HENT:     Head: Normocephalic and atraumatic.     Right Ear: External ear normal. There is impacted cerumen.     Left Ear: External ear normal. There is impacted cerumen.     Nose: Nose normal.      Mouth/Throat:     Mouth: Mucous membranes are moist.     Pharynx: Oropharynx is clear. No oropharyngeal exudate.  Eyes:     General: No scleral icterus.       Right eye: No discharge.        Left eye: No discharge.     Extraocular Movements: Extraocular movements intact.     Conjunctiva/sclera: Conjunctivae normal.     Pupils: Pupils are equal, round, and reactive to light.  Neck:     Musculoskeletal: Normal range of motion and neck supple.     Thyroid: No thyromegaly.     Vascular: No carotid bruit.     Trachea: No tracheal deviation.  Cardiovascular:     Rate and Rhythm: Normal rate and regular rhythm.     Heart sounds: Normal heart sounds. No murmur.     Comments: Heart is regular at 72/min and pedal pulses were difficult to palpate and he is wearing support stockings. Pulmonary:     Effort: Pulmonary effort is normal.     Breath sounds: Normal breath sounds. No wheezing or rales.     Comments: Lear anteriorly and posteriorly no chest wall masses and no axillary adenopathy Abdominal:     General: Bowel sounds are normal.     Palpations: Abdomen is soft. There is no mass.     Tenderness: There is no abdominal tenderness.     Comments: Obesity without masses tenderness organ enlargement or bruits  Musculoskeletal: Normal range of motion.        General: No tenderness.     Right lower leg: No edema.     Left lower leg: No edema.     Comments: Leg raising and palpation of low back and hip did not aggravate pain.  Lymphadenopathy:     Cervical: No cervical adenopathy.  Skin:    General: Skin is warm and dry.     Findings: No rash.  Neurological:     General: No focal deficit present.     Mental Status: He is alert and oriented to person, place, and time. Mental status is at baseline.     Cranial Nerves: No cranial nerve deficit.     Motor: No weakness.     Deep Tendon Reflexes: Reflexes are normal and symmetric. Reflexes normal.  Psychiatric:        Mood and Affect: Mood  normal.        Behavior: Behavior normal.        Thought Content: Thought content normal.        Judgment: Judgment normal.     Comments: Mood affect and behavior for this patient were normal.     BP (!) 157/77   Pulse 60   Temp (!) 96.7 F (35.9 C) (Oral)   Ht 6' (1.829 m)   Wt 269 lb (122  kg)   BMI 36.48 kg/m        Assessment & Plan:  1. Pure hypercholesterolemia -Continue with Crestor and as aggressive therapeutic lifestyle changes as possible including diet and exercise to achieve weight loss - CBC with Differential/Platelet - Lipid panel  2. Essential hypertension -Blood pressure is elevated this morning.  The patient has been taking ibuprofen.  We explained to him how this could irritate his stomach because of edema and raise his blood pressure and he will leave this off and take only Tylenol arthritis and will come to the office in about 4 weeks to recheck the blood pressure.  If he comes at that time and he is still having hip pain we will get x-rays of his spine and hip. - BMP8+EGFR - CBC with Differential/Platelet - Hepatic function panel - furosemide (LASIX) 20 MG tablet; TAKE  (1)  TABLET TWICE A DAY.  Dispense: 180 tablet; Refill: 3  3. Vitamin D deficiency -Continue with vitamin D replacement pending results of lab work - CBC with Differential/Platelet - VITAMIN D 25 Hydroxy (Vit-D Deficiency, Fractures)  4. Venous stasis of lower extremity -Continue wearing support stockings - furosemide (LASIX) 20 MG tablet; TAKE  (1)  TABLET TWICE A DAY.  Dispense: 180 tablet; Refill: 3  5. Morbid obesity ([place]) -Continue to make all efforts to lose weight through exercise and diet and lowering the BMI to less than 35.  6. Hearing loss of both ears due to cerumen impaction -Ear lavage bilaterally for impacted cerumen  Meds ordered this encounter  Medications  . furosemide (LASIX) 20 MG tablet    Sig: TAKE  (1)  TABLET TWICE A DAY.    Dispense:  180 tablet     Refill:  3   Patient Instructions                       Medicare Annual Wellness Visit  [facility] and the medical providers at [facility] strive to bring you the best medical care.  In doing so we not only want to address your current medical conditions and concerns but also to detect new conditions early and prevent illness, disease and health-related problems.    Medicare offers a yearly Wellness Visit which allows our clinical staff to assess your need for preventative services including immunizations, lifestyle education, counseling to decrease risk of preventable diseases and screening for fall risk and other medical concerns.    This visit is provided free of charge (no copay) for all Medicare recipients. The clinical pharmacists at [facility] have begun to conduct these Wellness Visits which will also include a thorough review of all your medications.    As you primary medical provider recommend that you make an appointment for your Annual Wellness Visit if you have not done so already this year.  You may set up this appointment before you leave today or you may call back ([phone]) and schedule an appointment.  Please make sure when you call that you mention that you are scheduling your Annual Wellness Visit with the clinical pharmacist so that the appointment may be made for the proper length of time.    Continue current medications. Continue good therapeutic lifestyle changes which include good diet and exercise. Fall precautions discussed with patient. If an FOBT was given today- please return it to our front desk. If you are over 38 years old - you may need Prevnar 85 or the adult Pneumonia  vaccine.  **Flu shots are available--- please call and schedule a FLU-CLINIC appointment**  After your visit with [place] today you will receive a survey in the mail or online from [employer] regarding your care with [place]. Please take a moment to fill  this out. Your feedback is very important to [place] as you can help [place] better understand your patient needs as well as improve your experience and satisfaction. WE CARE ABOUT YOU!!!   Continue to leave the wallet out while sitting on hard surfaces. Avoid ibuprofen Motrin and Aleve and only take Tylenol arthritis if needed for pain NSAIDs like Motrin can cause blood pressure to go up irritate the stomach and cause more edema in the legs. Use warm wet compresses 20 minutes 3 or 4 times daily to the area of pain Watch sodium intake Continue to be careful and do not put yourself at risk for falling After couple weeks of leaving off the ibuprofen, please come by and have the nurse to recheck your blood pressure at that time. Do not forget to get a good eye exam and make sure we get a copy of that report  Arrie Senate MD

## 2018-03-18 ENCOUNTER — Telehealth: Payer: Self-pay | Admitting: Family Medicine

## 2018-03-18 DIAGNOSIS — M25559 Pain in unspecified hip: Secondary | ICD-10-CM

## 2018-03-18 NOTE — Telephone Encounter (Signed)
He will need ortho appt when here next time - allusio or giofree = you can do a referrral for that for hip pain - per [employer]

## 2018-03-18 NOTE — Telephone Encounter (Signed)
Referral placed patient aware  

## 2018-03-18 NOTE — Telephone Encounter (Signed)
Will [facility] do an injection or does patient need referral?

## 2018-04-07 ENCOUNTER — Telehealth: Payer: Self-pay | Admitting: *Deleted

## 2018-04-07 NOTE — Telephone Encounter (Signed)
Pt called pharmacy, he was unable to get through to [place] Has a kidney infection, would like something called in  Called pt just states he has a kidney infection and has had a prescription for it before, is working and doesn't have time to come sit to be seen.

## 2018-04-07 NOTE — Telephone Encounter (Signed)
Patient aware he needs to be seen and declines making an appt.  He states that he does not have time

## 2018-04-07 NOTE — Telephone Encounter (Signed)
Pt needs to be seen

## 2018-04-23 ENCOUNTER — Telehealth: Payer: Self-pay | Admitting: Family Medicine

## 2018-04-23 NOTE — Telephone Encounter (Signed)
Number given for patient to reschedule appt

## 2018-04-30 DIAGNOSIS — L570 Actinic keratosis: Secondary | ICD-10-CM | POA: Diagnosis not present

## 2018-05-07 ENCOUNTER — Other Ambulatory Visit: Payer: Self-pay | Admitting: Family Medicine

## 2018-05-29 DIAGNOSIS — Z96642 Presence of left artificial hip joint: Secondary | ICD-10-CM | POA: Diagnosis not present

## 2018-05-29 DIAGNOSIS — M25552 Pain in left hip: Secondary | ICD-10-CM | POA: Diagnosis not present

## 2018-06-19 ENCOUNTER — Other Ambulatory Visit: Payer: Self-pay | Admitting: Family Medicine

## 2018-07-10 ENCOUNTER — Ambulatory Visit (INDEPENDENT_AMBULATORY_CARE_PROVIDER_SITE_OTHER): Payer: Medicare Other | Admitting: *Deleted

## 2018-07-10 ENCOUNTER — Other Ambulatory Visit: Payer: Self-pay

## 2018-07-10 ENCOUNTER — Encounter: Payer: Self-pay | Admitting: *Deleted

## 2018-07-10 DIAGNOSIS — Z0000 Encounter for general adult medical examination without abnormal findings: Secondary | ICD-10-CM | POA: Diagnosis not present

## 2018-07-10 NOTE — Patient Instructions (Signed)
  Mr. Cullens , Thank you for taking time to talk with me for your Medicare Wellness Visit. I appreciate your ongoing commitment to your health goals. Please review the following plan we discussed and let me know if I can assist you in the future.   These are the goals we discussed: Goals    . Exercise 150 minutes per week (moderate activity)     Continue to stay active.                         This is a list of the screening recommended for you and due dates:  Health Maintenance  Topic Date Due  . Pneumonia vaccines (2 of 2 - PPSV23) 02/10/2016  . Flu Shot  09/06/2018  . Tetanus Vaccine  06/03/2019

## 2018-07-10 NOTE — Progress Notes (Signed)
MEDICARE ANNUAL WELLNESS VISIT  07/10/2018  Telephone Visit Disclaimer This Medicare AWV was conducted by telephone due to national recommendations for restrictions regarding the COVID-19 Pandemic (e.g. social distancing).  I verified, using two identifiers, that I am speaking with [name] or their authorized healthcare agent. I discussed the limitations, risks, security, and privacy concerns of performing an evaluation and management service by telephone and the potential availability of an in-person appointment in the future. The patient expressed understanding and agreed to proceed.   Subjective:  [name] is a 85 y.o. male patient of Chipper Herb, MD who had a Medicare Annual Wellness Visit today via telephone. 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 He is divorced, and has 3 children and 2 grandchildren. He reports that he is socially active and does interact with friends/family regularly. 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    Patient Care Team: Chipper Herb, MD as PCP - General (Family Medicine) [name], MD as Consulting Physician (Orthopedic Surgery)  Advanced Directives 07/10/2018 07/04/2017 07/18/2016  Does Patient Have a Medical Advance Directive? No Yes Yes  Type of Advance Directive - Living will;Healthcare Power of [place];Living will  Copy of [place] in Chart? - No - copy requested -  Would patient like information on creating a medical advance directive? No - Patient declined [facility] Utilization Over the Past 12 Months: # of hospitalizations or ER visits: 0 # of surgeries: 0  Review of Systems    Patient reports that his overall health is unchanged compared to last year.    Review of Systems:   All systems negative.  Pain Assessment Pain Score: 0-No pain     Current  Medications & Allergies (verified) Allergies as of 07/10/2018   No Known Allergies     Medication List       Accurate as of July 10, 2018  8:57 AM. If you have any questions, ask your nurse or doctor.        clotrimazole-betamethasone cream Commonly known as:  LOTRISONE APPLY TO AFFECTED AREA TWICE DAILY   fish oil-omega-3 fatty acids 1000 MG capsule Take 2 g by mouth daily.   fluticasone 50 MCG/ACT nasal spray Commonly known as:  FLONASE Place 2 sprays into both nostrils daily.   furosemide 20 MG tablet Commonly known as:  LASIX TAKE  (1)  TABLET TWICE A DAY.   lisinopril 40 MG tablet Commonly known as:  ZESTRIL TAKE 1 TABLET DAILY   mupirocin ointment 2 % Commonly known as:  Bactroban Apply 1 application topically 2 (two) times daily.   rosuvastatin 5 MG tablet Commonly known as:  Crestor Take 0.5 tab on Monday, Wednesday and Friday evenings   triamcinolone ointment 0.5 % Commonly known as:  KENALOG Apply 1 application topically 2 (two) times daily.   Vitamin D (Ergocalciferol) 1.25 MG (50000 UT) Caps capsule Commonly known as:  DRISDOL TAKE 1 CAPSULE ONCE A WEEK       History (reviewed): Past Medical History:  Diagnosis Date  . Hyperlipidemia   . Hypertension    Past Surgical History:  Procedure Laterality Date  . JOINT REPLACEMENT Left    hip    Family History  Problem Relation Age of Onset  . Cancer Mother   . Stroke Father   . Dementia Brother   .  Healthy Daughter   . Healthy Son   . Healthy Son    Social History   Socioeconomic History  . Marital status: Divorced    Spouse name: Not on file  . Number of children: 3  . Years of education: 43  . Highest education level: 11th grade  Occupational History  . Not on file  Social Needs  . Financial resource strain: Not hard at all  . Food insecurity:    Worry: Never true    Inability: Never true  . Transportation needs:    Medical: No    Non-medical: No  Tobacco Use  . Smoking  status: Never Smoker  . Smokeless tobacco: Never Used  Substance and Sexual Activity  . Alcohol use: No  . Drug use: No  . Sexual activity: Not on file  Lifestyle  . Physical activity:    Days per week: 5 days    Minutes per session: 60 min  . Stress: Not at all  Relationships  . Social connections:    Talks on phone: More than three times a week    Gets together: More than three times a week    Attends religious service: Never    Active member of club or organization: No    Attends meetings of clubs or organizations: Never    Relationship status: Divorced  Other Topics Concern  . Not on file  Social History Narrative  . Not on file    Activities of Daily Living In your present state of health, do you have any difficulty performing the following activities: 07/10/2018  Hearing? N  Vision? N  Difficulty concentrating or making decisions? N  Walking or climbing stairs? N  Dressing or bathing? N  Doing errands, shopping? N  Preparing Food and eating ? N  Using the Toilet? N  In the past six months, have you accidently leaked urine? N  Do you have problems with loss of bowel control? N  Managing your Medications? N  Managing your Finances? N  Housekeeping or managing your Housekeeping? Y  Comment Has someone who cleans house every 2 weeks  Some recent data might be hidden    Patient Literacy    Exercise Current Exercise Habits: The patient has a physically strenuous job, but has no regular exercise apart from work., Exercise limited by: orthopedic condition(s)  Diet Patient reports consuming 3 meals a day and 1 snack(s) a day Patient reports that his primary diet is: Regular Patient reports that she does have regular access to food.   Depression Screen PHQ 2/9 Scores 07/10/2018 03/05/2018 07/18/2017 07/04/2017 06/28/2017 06/25/2017 06/19/2017  PHQ - 2 Score 0 0 0 0 0 0 0     Fall Risk Fall Risk  07/10/2018 03/05/2018 07/18/2017 07/04/2017 06/28/2017  Falls in the past year?  0 0 No No No  Follow up Falls prevention discussed - - - -     Objective:  [name] seemed alert and oriented and he participated appropriately during our telephone visit.  Blood Pressure Weight BMI  BP Readings from Last 3 Encounters:  03/05/18 (!) 157/77  07/18/17 136/76  07/04/17 132/78   Wt Readings from Last 3 Encounters:  03/05/18 269 lb (122 kg)  07/18/17 267 lb (121.1 kg)  07/04/17 266 lb (120.7 kg)   BMI Readings from Last 1 Encounters:  03/05/18 36.48 kg/m    *Unable to obtain current vital signs, weight, and BMI due to telephone visit type  Hearing/Vision  . Dohn did  seem to have difficulty with hearing/understanding during the telephone conversation, however, patient denies having hearing problems. . Reports that he has had a formal eye exam by an eye care professional within the past year . Reports that he has not had a formal hearing evaluation within the past year *Unable to fully assess hearing and vision during telephone visit type  Cognitive Function: 6CIT Screen 07/10/2018  What Year? 0 points  What month? 0 points  What time? 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   Immunization & Health Maintenance Record Immunization History  Administered Date(s) Administered  . Influenza Whole 12/06/2009  . Influenza, High Dose Seasonal PF 12/07/2015, 11/26/2016, 03/05/2018  . Influenza,inj,Quad PF,6+ Mos 01/02/2013, 01/06/2014, 01/10/2015  . Pneumococcal Conjugate-13 02/10/2015  . Td 06/02/2009    Health Maintenance  Topic Date Due  . PNA vac Low Risk Adult (2 of 2 - PPSV23) 02/10/2016  . INFLUENZA VACCINE  09/06/2018  . TETANUS/TDAP  06/03/2019   Recommend Pneumovax 23 and Shingrix vaccines at next in person office visit.     Assessment  This is a routine wellness examination for [facility].  Health Maintenance: Due or Overdue Health Maintenance Due  Topic Date  Due  . PNA vac Low Risk Adult (2 of 2 - PPSV23) 02/10/2016    [name] does not need a referral for Community Assistance: Care Management:   no Social Work:    no Prescription Assistance:  no Nutrition/Diabetes Education:  no   Plan:  Personalized Goals Goals Addressed            This Visit's Progress   . Exercise 150 minutes per week (moderate activity)   On track    Continue to stay active.      Personalized Health Maintenance & Screening Recommendations  Pneumococcal vaccine  Advanced directives: has NO advanced directive - not interested in additional information  Lung Cancer Screening Recommended: not applicable (Low Dose CT Chest recommended if Age 20-80 years, 30 pack-year currently smoking OR have quit w/in past 15 years) Hepatitis C Screening recommended: not applicable   Advanced Directives: Written information was not prepared per patient's request.  Referrals & Orders N/A  Follow-up Plan . Follow-up with Chipper Herb, MD as planned . Continue to be active and move carefully to avoid falls    I have personally reviewed and noted the following in the patient's chart:   . Medical and social history . Use of alcohol, tobacco or illicit drugs  . Current medications and supplements . Functional ability and status . Nutritional status . Physical activity . Advanced directives . List of other physicians . Hospitalizations, surgeries, and ER visits in previous 12 months . Vitals . Screenings to include cognitive, depression, and falls . Referrals and appointments  In addition, I have reviewed and discussed with [name] certain preventive protocols, quality metrics, and best practice recommendations. A written personalized care plan for preventive services as well as general preventive health recommendations is available and can be mailed to the patient at his request.      ,  M  07/10/2018

## 2018-08-07 ENCOUNTER — Other Ambulatory Visit: Payer: Self-pay | Admitting: Family Medicine

## 2018-09-03 ENCOUNTER — Ambulatory Visit: Payer: Medicare Other | Admitting: Family Medicine

## 2018-09-05 ENCOUNTER — Ambulatory Visit: Payer: Medicare Other | Admitting: Family Medicine

## 2018-09-30 IMAGING — DX DG CHEST 2V
2 series · 2 of 2 positions shown · non-contrast
Comparison: 02/10/2015

CLINICAL DATA: Hypertension

EXAM:
CHEST - 2 VIEW

[chest pa]
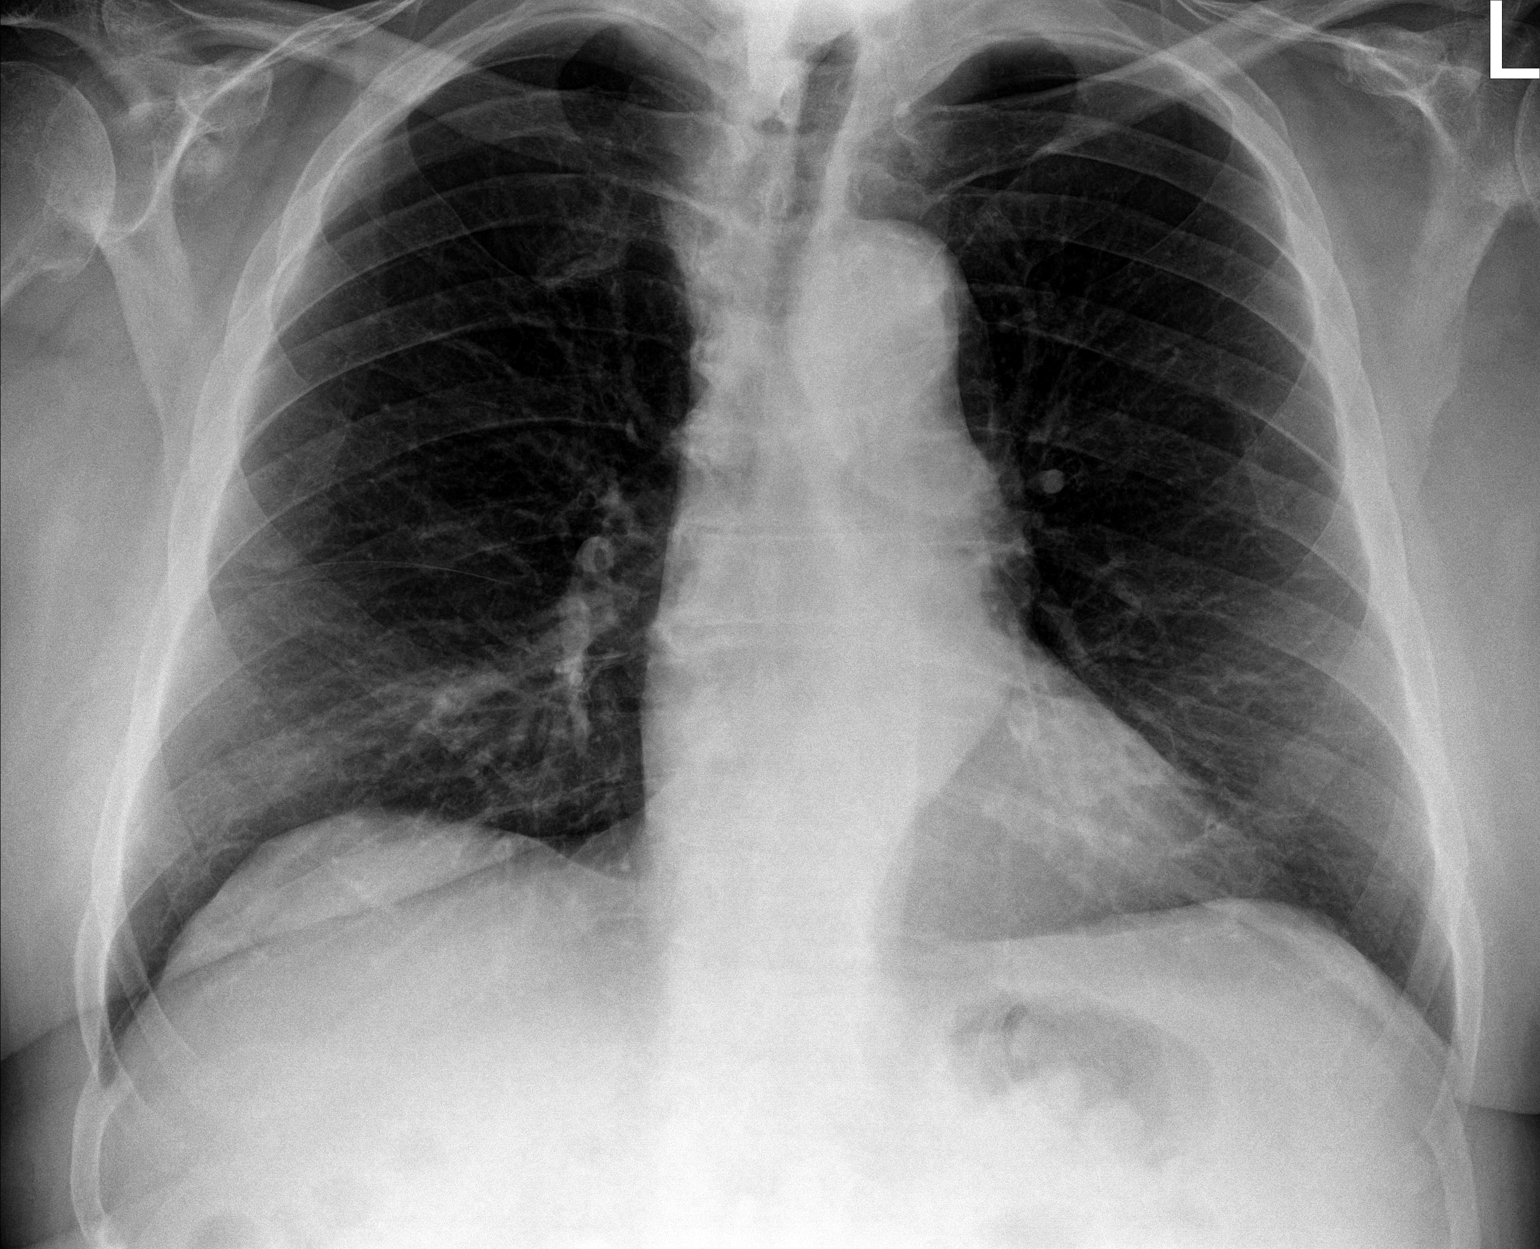

[chest lat]
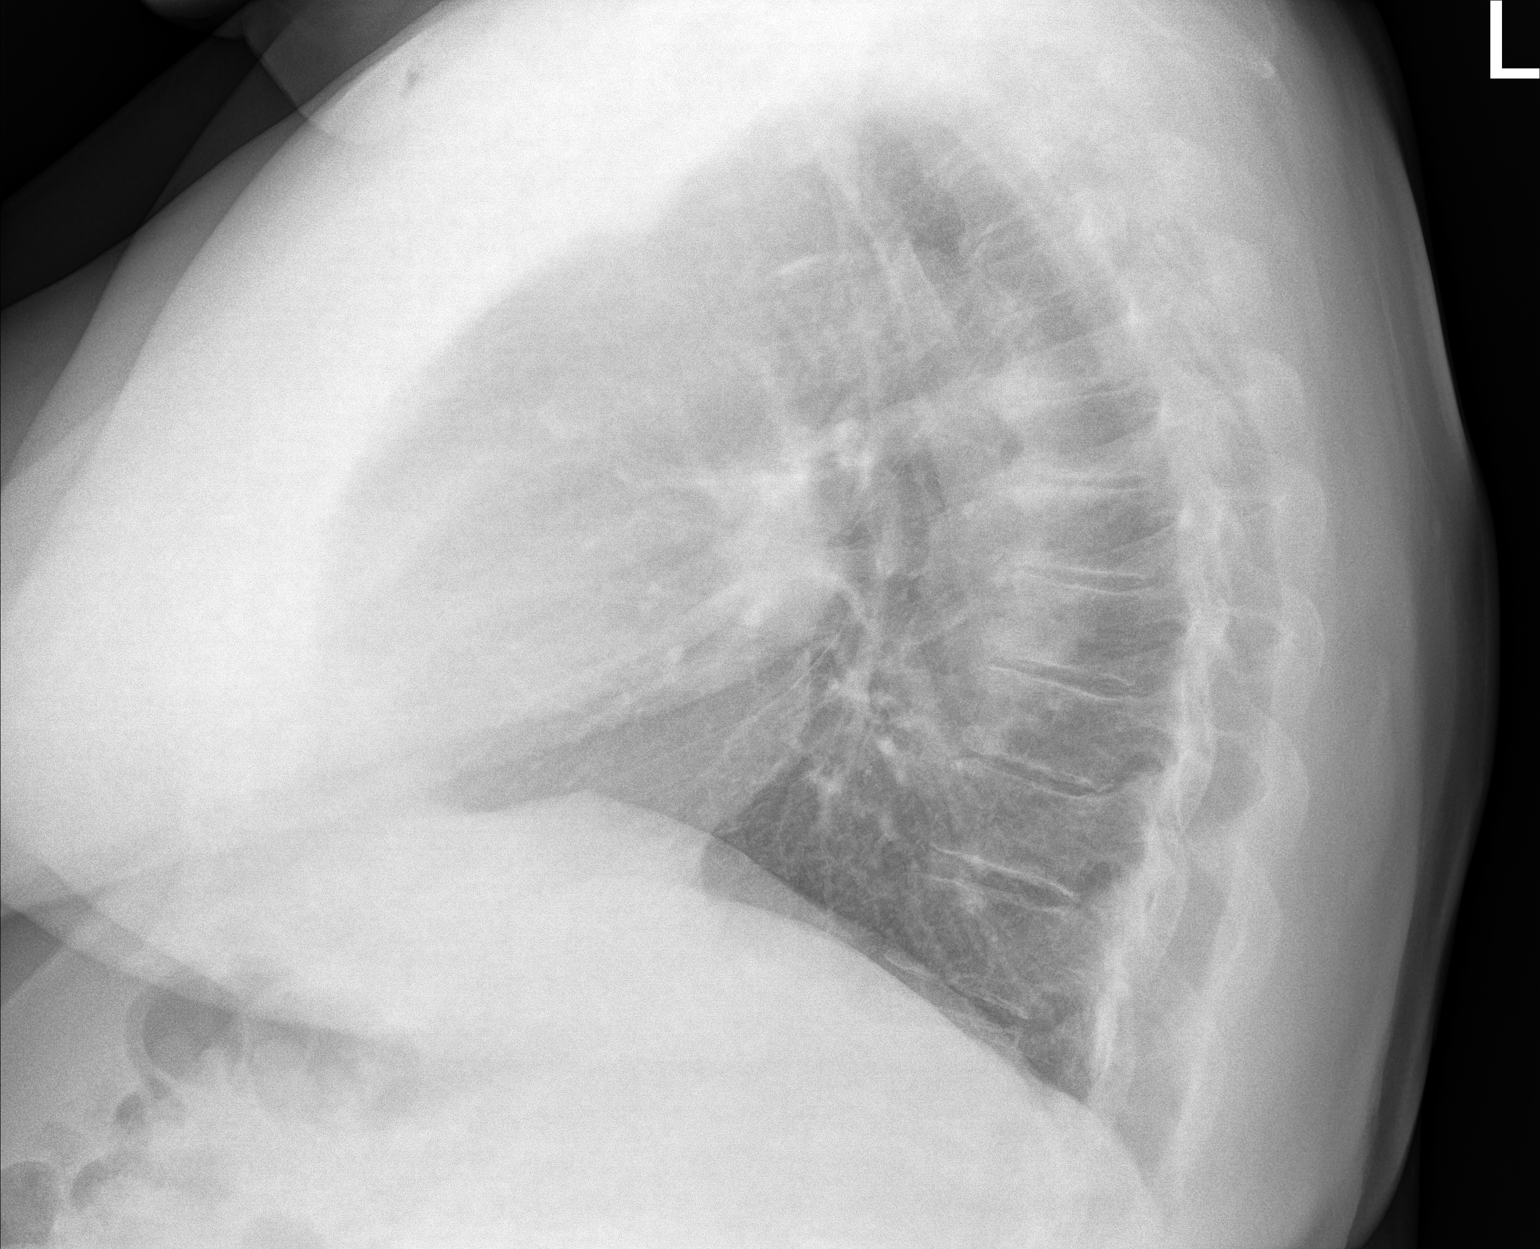

[2 of 2 positions shown; findings below may reference images not displayed]

FINDINGS: Heart and mediastinal contours are within normal limits. No focal
opacities or effusions. No acute bony abnormality.
IMPRESSION: No active cardiopulmonary disease.

## 2018-10-08 ENCOUNTER — Other Ambulatory Visit: Payer: Self-pay | Admitting: Family Medicine

## 2018-11-03 DIAGNOSIS — L570 Actinic keratosis: Secondary | ICD-10-CM | POA: Diagnosis not present

## 2018-11-10 ENCOUNTER — Other Ambulatory Visit: Payer: Self-pay | Admitting: *Deleted

## 2018-11-10 MED ORDER — LISINOPRIL 40 MG PO TABS: 40.0000 mg | ORAL_TABLET | Freq: Every day | ORAL | 0 refills | Status: DC

## 2018-11-17 ENCOUNTER — Other Ambulatory Visit: Payer: Self-pay

## 2018-11-18 ENCOUNTER — Ambulatory Visit (INDEPENDENT_AMBULATORY_CARE_PROVIDER_SITE_OTHER): Payer: Medicare Other

## 2018-11-18 DIAGNOSIS — Z23 Encounter for immunization: Secondary | ICD-10-CM

## 2018-12-08 ENCOUNTER — Other Ambulatory Visit: Payer: Self-pay | Admitting: Family Medicine

## 2018-12-25 ENCOUNTER — Encounter: Payer: Self-pay | Admitting: Family

## 2018-12-25 ENCOUNTER — Ambulatory Visit (INDEPENDENT_AMBULATORY_CARE_PROVIDER_SITE_OTHER): Payer: Medicare Other | Admitting: Family

## 2018-12-25 DIAGNOSIS — J208 Acute bronchitis due to other specified organisms: Secondary | ICD-10-CM | POA: Diagnosis not present

## 2018-12-25 DIAGNOSIS — B9689 Other specified bacterial agents as the cause of diseases classified elsewhere: Secondary | ICD-10-CM

## 2018-12-25 MED ORDER — AZITHROMYCIN 250 MG PO TABS: ORAL_TABLET | ORAL | 0 refills | Status: DC

## 2018-12-25 MED ORDER — FLUTICASONE PROPIONATE 50 MCG/ACT NA SUSP: 2.0000 | Freq: Every day | NASAL | 6 refills | Status: DC

## 2018-12-25 MED ORDER — BENZONATATE 200 MG PO CAPS: 200.0000 mg | ORAL_CAPSULE | Freq: Three times a day (TID) | ORAL | 1 refills | Status: DC | PRN

## 2018-12-25 NOTE — Progress Notes (Signed)
Virtual Visit via telephone Note Due to COVID-19 pandemic this visit was conducted virtually. This visit type was conducted due to national recommendations for restrictions regarding the COVID-19 Pandemic (e.g. social distancing, sheltering in place) in an effort to limit this patient's exposure and mitigate transmission in our community. All issues noted in this document were discussed and addressed.  A physical exam was not performed with this format.  I connected with [name] on 12/25/18 at 4:49 by telephone and verified that I am speaking with the correct person using two identifiers. [name] is currently located at home and no one is currently with him during visit. The provider, Evelina Dun, FNP is located in their office at time of visit.  I discussed the limitations, risks, security and privacy concerns of performing an evaluation and management service by telephone and the availability of in person appointments. I also discussed with the patient that there may be a patient responsible charge related to this service. The patient expressed understanding and agreed to proceed.   History and Present Illness:  Cough This is a new problem. The current episode started 1 to 4 weeks ago. The problem has been waxing and waning. The problem occurs every few minutes. The cough is non-productive. Associated symptoms include nasal congestion and postnasal drip. Pertinent negatives include no chills, ear congestion, ear pain, fever, headaches, myalgias, sore throat, shortness of breath or wheezing. The symptoms are aggravated by lying down. He has tried rest and OTC cough suppressant for the symptoms. The treatment provided mild relief.      Review of Systems  Constitutional: Negative for chills and fever.  HENT: Positive for postnasal drip. Negative for ear pain and sore throat.   Respiratory: Positive for cough. Negative for shortness of breath and wheezing.   Musculoskeletal:  Negative for myalgias.  Neurological: Negative for headaches.  All other systems reviewed and are negative.    Observations/Objective: No SOB or distress noted   Assessment and Plan: 1. Acute bacterial bronchitis - Take meds as prescribed - Use a cool mist humidifier  -Use saline nose sprays frequently -Force fluids -For any cough or congestion  Use plain Mucinex- regular strength or max strength is fine -For fever or aces or pains- take tylenol or ibuprofen. -Throat lozenges if help -RTO if symptoms worsen or do not improve - azithromycin (ZITHROMAX) 250 MG tablet; Take 500 mg once, then 250 mg for four days  Dispense: 6 tablet; Refill: 0 - benzonatate (TESSALON) 200 MG capsule; Take 1 capsule (200 mg total) by mouth 3 (three) times daily as needed.  Dispense: 30 capsule; Refill: 1 - fluticasone (FLONASE) 50 MCG/ACT nasal spray; Place 2 sprays into both nostrils daily.  Dispense: 16 g; Refill: 6     I discussed the assessment and treatment plan with the patient. The patient was provided an opportunity to ask questions and all were answered. The patient agreed with the plan and demonstrated an understanding of the instructions.   The patient was advised to call back or seek an in-person evaluation if the symptoms worsen or if the condition fails to improve as anticipated.  The above assessment and management plan was discussed with the patient. The patient verbalized understanding of and has agreed to the management plan. Patient is aware to call the clinic if symptoms persist or worsen. Patient is aware when to return to the clinic for a follow-up visit. Patient educated on when it is appropriate to go to the emergency department.  Time call ended: 4:57 pm   I provided 8 minutes of non-face-to-face time during this encounter.    Evelina Dun, FNP

## 2019-01-05 ENCOUNTER — Other Ambulatory Visit: Payer: Self-pay | Admitting: Family Medicine

## 2019-01-25 DIAGNOSIS — B029 Zoster without complications: Secondary | ICD-10-CM | POA: Diagnosis not present

## 2019-02-04 ENCOUNTER — Other Ambulatory Visit: Payer: Self-pay | Admitting: Family Medicine

## 2019-02-04 NOTE — Telephone Encounter (Signed)
Appointment scheduled.

## 2019-02-04 NOTE — Telephone Encounter (Signed)
[facility] NTBS 30 days given 01/05/19

## 2019-02-09 ENCOUNTER — Encounter: Payer: Self-pay | Admitting: Family Medicine

## 2019-02-09 ENCOUNTER — Ambulatory Visit (INDEPENDENT_AMBULATORY_CARE_PROVIDER_SITE_OTHER): Payer: Medicare Other | Admitting: Family Medicine

## 2019-02-09 DIAGNOSIS — E559 Vitamin D deficiency, unspecified: Secondary | ICD-10-CM | POA: Diagnosis not present

## 2019-02-09 DIAGNOSIS — E782 Mixed hyperlipidemia: Secondary | ICD-10-CM

## 2019-02-09 DIAGNOSIS — I10 Essential (primary) hypertension: Secondary | ICD-10-CM | POA: Diagnosis not present

## 2019-02-09 DIAGNOSIS — I878 Other specified disorders of veins: Secondary | ICD-10-CM | POA: Diagnosis not present

## 2019-02-09 MED ORDER — LISINOPRIL 40 MG PO TABS: 40.0000 mg | ORAL_TABLET | Freq: Every day | ORAL | 2 refills | Status: DC

## 2019-02-09 MED ORDER — FUROSEMIDE 20 MG PO TABS: ORAL_TABLET | ORAL | 3 refills | Status: DC

## 2019-02-09 MED ORDER — ROSUVASTATIN CALCIUM 5 MG PO TABS: ORAL_TABLET | ORAL | 2 refills | Status: DC

## 2019-02-09 MED ORDER — VITAMIN D (ERGOCALCIFEROL) 1.25 MG (50000 UNIT) PO CAPS: ORAL_CAPSULE | ORAL | 1 refills | Status: DC

## 2019-02-09 NOTE — Progress Notes (Signed)
Virtual Visit via telephone Note Due to COVID-19 pandemic this visit was conducted virtually. This visit type was conducted due to national recommendations for restrictions regarding the COVID-19 Pandemic (e.g. social distancing, sheltering in place) in an effort to limit this patient's exposure and mitigate transmission in our community. All issues noted in this document were discussed and addressed.  A physical exam was not performed with this format.   I connected with [name] on 02/09/2019 at [facility] by telephone and verified that I am speaking with the correct person using two identifiers. [name] is currently located at home and family is currently with them during visit. The provider, Monia Pouch, FNP is located in their office at time of visit.  I discussed the limitations, risks, security and privacy concerns of performing an evaluation and management service by telephone and the availability of in person appointments. I also discussed with the patient that there may be a patient responsible charge related to this service. The patient expressed understanding and agreed to proceed.  Subjective:  Patient ID: [name], male    DOB: 10/26/1929, 86 y.o.   MRN: [id number]  Chief Complaint:  Medical Management of Chronic Issues   HPI: [name] is a 86 y.o. male presenting on 02/09/2019 for Medical Management of Chronic Issues   Pt following up today for management of chronic medical conditions. Pt states he does not want to come into the office due to the COVID-19 pandemic. Pt states he has been doing great. States he has lost 3-4 pounds over the last few weeks. States he has cut back on portions and is trying to eat healthier foods. He does take medications as prescribed without associated side effects. States he is urinating normally. No changes in mood or sleep habits. States he feels great. He states he is out of his weekly Vit D repletion pills and would like this  refilled today. States he does not have any arthralgias or myalgias.   Hyperlipidemia This is a chronic problem. The current episode started more than 1 year ago. The problem is controlled. Exacerbating diseases include obesity. Factors aggravating his hyperlipidemia include fatty foods. Pertinent negatives include no chest pain, focal sensory loss, focal weakness, leg pain, myalgias or shortness of breath. Current antihyperlipidemic treatment includes statins. The current treatment provides moderate improvement of lipids. Compliance problems include adherence to diet and adherence to exercise.  Risk factors for coronary artery disease include dyslipidemia, hypertension, male sex, obesity and a sedentary lifestyle.  Hypertension This is a chronic problem. The current episode started more than 1 year ago. The problem has been waxing and waning since onset. The problem is controlled. Associated symptoms include peripheral edema. Pertinent negatives include no anxiety, blurred vision, chest pain, headaches, malaise/fatigue, neck pain, orthopnea, palpitations, PND, shortness of breath or sweats. Risk factors for coronary artery disease include dyslipidemia, obesity, male gender and sedentary lifestyle. Past treatments include ACE inhibitors and diuretics. The current treatment provides moderate improvement. Compliance problems include exercise and diet.      Relevant past medical, surgical, family, and social history reviewed and updated as indicated.  Allergies and medications reviewed and updated.   Past Medical History:  Diagnosis Date  . Hyperlipidemia   . Hypertension     Past Surgical History:  Procedure Laterality Date  . JOINT REPLACEMENT Left    hip     Social History   Socioeconomic History  . Marital status: Divorced    Spouse name: Not on file  .  Number of children: 3  . Years of education: 21  . Highest education level: 11th grade  Occupational History  . Not on file    Tobacco Use  . Smoking status: Never Smoker  . Smokeless tobacco: Never Used  Substance and Sexual Activity  . Alcohol use: No  . Drug use: No  . Sexual activity: Not on file  Other Topics Concern  . Not on file  Social History Narrative  . Not on file   Social Determinants of Health   Financial Resource Strain: Low Risk   . Difficulty of Paying Living Expenses: Not hard at all  Food Insecurity: No Food Insecurity  . Worried About Charity fundraiser in the Last Year: Never true  . Ran Out of Food in the Last Year: Never true  Transportation Needs: No Transportation Needs  . Lack of Transportation (Medical): No  . Lack of Transportation (Non-Medical): No  Physical Activity: Sufficiently Active  . Days of Exercise per Week: 5 days  . Minutes of Exercise per Session: 60 min  Stress: No Stress Concern Present  . Feeling of Stress : Not at all  Social Connections: Moderately Isolated  . Frequency of Communication with Friends and Family: More than three times a week  . Frequency of Social Gatherings with Friends and Family: More than three times a week  . Attends Religious Services: Never  . Active Member of Clubs or Organizations: No  . Attends Archivist Meetings: Never  . Marital Status: Divorced  Human resources officer Violence: Not At Risk  . Fear of Current or Ex-Partner: No  . Emotionally Abused: No  . Physically Abused: No  . Sexually Abused: No    Outpatient Encounter Medications as of 02/09/2019  Medication Sig  . clotrimazole-betamethasone (LOTRISONE) cream APPLY TO AFFECTED AREA TWICE DAILY  . fish oil-omega-3 fatty acids 1000 MG capsule Take 2 g by mouth daily.    . fluticasone (FLONASE) 50 MCG/ACT nasal spray Place 2 sprays into both nostrils daily.  . furosemide (LASIX) 20 MG tablet TAKE  (1)  TABLET TWICE A DAY.  Marland Kitchen lisinopril (ZESTRIL) 40 MG tablet Take 1 tablet (40 mg total) by mouth daily.  . mupirocin ointment (BACTROBAN) 2 % Apply 1 application  topically 2 (two) times daily.  . rosuvastatin (CRESTOR) 5 MG tablet Take 0.5 tab on Monday, Wednesday and Friday evenings  . triamcinolone ointment (KENALOG) 0.5 % Apply 1 application topically 2 (two) times daily.  . Vitamin D, Ergocalciferol, (DRISDOL) 1.25 MG (50000 UT) CAPS capsule Once every 7 days  . [DISCONTINUED] azithromycin (ZITHROMAX) 250 MG tablet Take 500 mg once, then 250 mg for four days  . [DISCONTINUED] benzonatate (TESSALON) 200 MG capsule Take 1 capsule (200 mg total) by mouth 3 (three) times daily as needed.  . [DISCONTINUED] furosemide (LASIX) 20 MG tablet TAKE  (1)  TABLET TWICE A DAY.  . [DISCONTINUED] lisinopril (ZESTRIL) 40 MG tablet TAKE 1 TABLET ONCE DAILY  . [DISCONTINUED] rosuvastatin (CRESTOR) 5 MG tablet Take 0.5 tab on Monday, Wednesday and Friday evenings  . [DISCONTINUED] Vitamin D, Ergocalciferol, (DRISDOL) 1.25 MG (50000 UT) CAPS capsule TAKE 1 CAPSULE ONCE A WEEK   No facility-administered encounter medications on file as of 02/09/2019.    No Known Allergies  Review of Systems  Constitutional: Negative for activity change, appetite change, chills, diaphoresis, fatigue, fever, malaise/fatigue and unexpected weight change.  HENT: Negative.   Eyes: Negative.  Negative for blurred vision, photophobia and visual disturbance.  Respiratory: Negative for cough, chest tightness and shortness of breath.   Cardiovascular: Positive for leg swelling (minimal). Negative for chest pain, palpitations, orthopnea and PND.  Gastrointestinal: Negative for abdominal pain, blood in stool, constipation, diarrhea, nausea and vomiting.  Endocrine: Negative.   Genitourinary: Negative for decreased urine volume, difficulty urinating, dysuria, frequency and urgency.  Musculoskeletal: Negative for arthralgias, back pain, myalgias and neck pain.  Skin: Negative.  Negative for color change and rash.  Allergic/Immunologic: Negative.   Neurological: Negative for dizziness, tremors,  focal weakness, seizures, syncope, facial asymmetry, speech difficulty, weakness, light-headedness, numbness and headaches.  Hematological: Negative.   Psychiatric/Behavioral: Negative for confusion, hallucinations, sleep disturbance and suicidal ideas.  All other systems reviewed and are negative.        Observations/Objective: No vital signs or physical exam, this was a telephone or virtual health encounter.  Pt alert and oriented, answers all questions appropriately, and able to speak in full sentences.    Assessment and Plan: [name] was seen today for medical management of chronic issues.  Diagnoses and all orders for this visit:  Vitamin D deficiency Continue repletion therapy. If indicated, will change repletion dosage. Eat foods rich in Vit D including milk, orange juice, yogurt with vitamin D added, salmon or mackerel, canned tuna fish, cereals with vitamin D added, and cod liver oil. Get out in the sun but make sure to wear at least SPF 30 sunscreen.  -     Vitamin D, Ergocalciferol, (DRISDOL) 1.25 MG (50000 UT) CAPS capsule; Once every 7 days  Venous stasis of lower extremity Reports doing well, continue below.  -     furosemide (LASIX) 20 MG tablet; TAKE  (1)  TABLET TWICE A DAY.  Essential hypertension BP well controlled. Changes were not made in regimen today. Goal BP is 130/80. Pt aware to report any persistent high or low readings. DASH diet and exercise encouraged. Exercise at least 150 minutes per week and increase as tolerated. Goal BMI > 25. Stress management encouraged. Avoid nicotine and tobacco product use. Avoid excessive alcohol and NSAID's. Avoid more than 2000 mg of sodium daily. Medications as prescribed. Follow up as scheduled. Pt aware to follow up in office for labs.  -     furosemide (LASIX) 20 MG tablet; TAKE  (1)  TABLET TWICE A DAY. -     lisinopril (ZESTRIL) 40 MG tablet; Take 1 tablet (40 mg total) by mouth daily.  Morbid obesity ([place]) Diet and  exercise encouraged.   Mixed hyperlipidemia Diet encouraged - increase intake of fresh fruits and vegetables, increase intake of lean proteins. Bake, broil, or grill foods. Avoid fried, greasy, and fatty foods. Avoid fast foods. Increase intake of fiber-rich whole grains. Exercise encouraged - at least 150 minutes per week and advance as tolerated.  Goal BMI < 25. Continue medications as prescribed. Follow up in 3-6 months as discussed. Pt aware to follow up for lab work.  -     rosuvastatin (CRESTOR) 5 MG tablet; Take 0.5 tab on Monday, Wednesday and Friday evenings     Follow Up Instructions: Return in about 1 month (around 03/12/2019), or if symptoms worsen or fail to improve, for Labs. HTN, Lipids, Vit D .    I discussed the assessment and treatment plan with the patient. The patient was provided an opportunity to ask questions and all were answered. The patient agreed with the plan and demonstrated an understanding of the instructions.   The patient was advised to call back  or seek an in-person evaluation if the symptoms worsen or if the condition fails to improve as anticipated.  The above assessment and management plan was discussed with the patient. The patient verbalized understanding of and has agreed to the management plan. Patient is aware to call the clinic if they develop any new symptoms or if symptoms persist or worsen. Patient is aware when to return to the clinic for a follow-up visit. Patient educated on when it is appropriate to go to the emergency department.    I provided 25 minutes of non-face-to-face time during this encounter. The call started at [place]. The call ended at 87. The other time was used for coordination of care.    Monia Pouch, FNP-C [facility] [address] [place], [place] [address] [phone] 02/09/2019

## 2019-05-05 DIAGNOSIS — D3611 Benign neoplasm of peripheral nerves and autonomic nervous system of face, head, and neck: Secondary | ICD-10-CM | POA: Diagnosis not present

## 2019-05-05 DIAGNOSIS — D485 Neoplasm of uncertain behavior of skin: Secondary | ICD-10-CM | POA: Diagnosis not present

## 2019-05-05 DIAGNOSIS — Z85828 Personal history of other malignant neoplasm of skin: Secondary | ICD-10-CM | POA: Diagnosis not present

## 2019-05-05 DIAGNOSIS — L570 Actinic keratosis: Secondary | ICD-10-CM | POA: Diagnosis not present

## 2019-05-05 DIAGNOSIS — L821 Other seborrheic keratosis: Secondary | ICD-10-CM | POA: Diagnosis not present

## 2019-05-14 ENCOUNTER — Telehealth: Payer: Self-pay | Admitting: *Deleted

## 2019-05-14 NOTE — Telephone Encounter (Signed)
Fax from [facility] Note from pharmacy: pt believes he has a kidney infection - wanted pharmacy to contact us for a Rx. Pt was told we would probably need to see him, but he said 'no'

## 2019-05-14 NOTE — Telephone Encounter (Signed)
He says he will call in a few days if he needs [place].

## 2019-06-08 ENCOUNTER — Other Ambulatory Visit: Payer: Self-pay | Admitting: *Deleted

## 2019-06-08 DIAGNOSIS — I10 Essential (primary) hypertension: Secondary | ICD-10-CM

## 2019-06-08 MED ORDER — LISINOPRIL 40 MG PO TABS: 40.0000 mg | ORAL_TABLET | Freq: Every day | ORAL | 2 refills | Status: DC

## 2019-07-16 DIAGNOSIS — M170 Bilateral primary osteoarthritis of knee: Secondary | ICD-10-CM | POA: Diagnosis not present

## 2019-07-16 DIAGNOSIS — M545 Low back pain: Secondary | ICD-10-CM | POA: Diagnosis not present

## 2019-08-26 ENCOUNTER — Ambulatory Visit (INDEPENDENT_AMBULATORY_CARE_PROVIDER_SITE_OTHER): Payer: Medicare Other | Admitting: *Deleted

## 2019-08-26 VITALS — BP 140/70 | Ht 72.0 in | Wt 269.0 lb

## 2019-08-26 DIAGNOSIS — Z0000 Encounter for general adult medical examination without abnormal findings: Secondary | ICD-10-CM | POA: Diagnosis not present

## 2019-08-26 NOTE — Progress Notes (Signed)
MEDICARE ANNUAL WELLNESS VISIT  08/26/2019  Telephone Visit Disclaimer This Medicare AWV was conducted by telephone due to national recommendations for restrictions regarding the COVID-19 Pandemic (e.g. social distancing).  I verified, using two identifiers, that I am speaking with [name] or their authorized healthcare agent. I discussed the limitations, risks, security, and privacy concerns of performing an evaluation and management service by telephone and the potential availability of an in-person appointment in the future. The patient expressed understanding and agreed to proceed.   Subjective:  [name] is a 86 y.o. male patient of Tyler Brooklyn, FNP who had a Medicare Annual Wellness Visit today via telephone. 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 he has 3 children. he reports that he is socially active and does interact with friends/family regularly. he is minimally physically active and enjoys working.  Patient Care Team: Tyler Brooklyn, FNP as PCP - General (Family Medicine) [name], MD as Consulting Physician (Orthopedic Surgery)  Advanced Directives 08/26/2019 07/10/2018 07/04/2017 07/18/2016  Does Patient Have a Medical Advance Directive? Yes No Yes Yes  Type of Paramedic of [place];Living will - Living will;Healthcare Power of [place];Living will  Does patient want to make changes to medical advance directive? No - Patient declined - - -  Copy of [place] in Chart? No - copy requested - No - copy requested -  Would patient like information on creating a medical advance directive? - No - Patient declined - [facility] Utilization Over the Past 12 Months: # of hospitalizations or ER visits: 0 # of surgeries: 0  Review of Systems    Patient reports that his overall health is unchanged compared to last year.  General ROS: negative  Patient  Reported Readings (BP, Pulse, CBG, Weight, etc) Ht 6' (1.829 m)   Wt 269 lb (122 kg)   BMI 36.48 kg/m    Pain Assessment       Current Medications & Allergies (verified) Allergies as of 08/26/2019   No Known Allergies     Medication List       Accurate as of August 26, 2019  8:37 AM. If you have any questions, ask your nurse or doctor.        STOP taking these medications   fluticasone 50 MCG/ACT nasal spray Commonly known as: FLONASE   Vitamin D (Ergocalciferol) 1.25 MG (50000 UNIT) Caps capsule Commonly known as: DRISDOL     TAKE these medications   clotrimazole-betamethasone cream Commonly known as: LOTRISONE APPLY TO AFFECTED AREA TWICE DAILY   fish oil-omega-3 fatty acids 1000 MG capsule Take 2 g by mouth daily.   furosemide 20 MG tablet Commonly known as: LASIX TAKE  (1)  TABLET TWICE A DAY.   lisinopril 40 MG tablet Commonly known as: ZESTRIL Take 1 tablet (40 mg total) by mouth daily.   mupirocin ointment 2 % Commonly known as: Bactroban Apply 1 application topically 2 (two) times daily.   predniSONE 5 MG tablet Commonly known as: DELTASONE Take 5 mg by mouth daily.   rosuvastatin 5 MG tablet Commonly known as: Crestor Take 0.5 tab on Monday, Wednesday and Friday evenings   triamcinolone ointment 0.5 % Commonly known as: KENALOG Apply 1 application topically 2 (two) times daily.       History (reviewed): Past Medical History:  Diagnosis Date  . Hyperlipidemia   . Hypertension  Past Surgical History:  Procedure Laterality Date  . JOINT REPLACEMENT Left    hip    Family History  Problem Relation Age of Onset  . Cancer Mother   . Stroke Father   . Dementia Brother   . Healthy Daughter   . Healthy Son   . Healthy Son    Social History   Socioeconomic History  . Marital status: Divorced    Spouse name: Not on file  . Number of children: 3  . Years of education: 23  . Highest education level: 11th grade  Occupational  History  . Not on file  Tobacco Use  . Smoking status: Never Smoker  . Smokeless tobacco: Never Used  Vaping Use  . Vaping Use: Never used  Substance and Sexual Activity  . Alcohol use: No  . Drug use: No  . Sexual activity: Not on file  Other Topics Concern  . Not on file  Social History Narrative  . Not on file   Social Determinants of Health   Financial Resource Strain:   . Difficulty of Paying Living Expenses:   Food Insecurity:   . Worried About Charity fundraiser in the Last Year:   . Arboriculturist in the Last Year:   Transportation Needs:   . Film/video editor (Medical):   Marland Kitchen Lack of Transportation (Non-Medical):   Physical Activity:   . Days of Exercise per Week:   . Minutes of Exercise per Session:   Stress:   . Feeling of Stress :   Social Connections:   . Frequency of Communication with Friends and Family:   . Frequency of Social Gatherings with Friends and Family:   . Attends Religious Services:   . Active Member of Clubs or Organizations:   . Attends Archivist Meetings:   Marland Kitchen Marital Status:     Activities of Daily Living In your present state of health, do you have any difficulty performing the following activities: 08/26/2019  Hearing? N  Vision? N  Difficulty concentrating or making decisions? N  Walking or climbing stairs? N  Dressing or bathing? N  Doing errands, shopping? N  Preparing Food and eating ? N  Using the Toilet? N  In the past six months, have you accidently leaked urine? N  Do you have problems with loss of bowel control? N  Managing your Medications? N  Managing your Finances? N  Housekeeping or managing your Housekeeping? N  Some recent data might be hidden    Patient Education/ Literacy    Exercise Current Exercise Habits: The patient does not participate in regular exercise at present (work only)  Diet Patient reports consuming 2 meals a day and 1 snack(s) a day Patient reports that his primary diet  is: Regular Patient reports that she does have regular access to food.   Depression Screen PHQ 2/9 Scores 08/26/2019 02/09/2019 07/10/2018 03/05/2018 07/18/2017 07/04/2017 06/28/2017  PHQ - 2 Score 0 0 0 0 0 0 0     Fall Risk Fall Risk  08/26/2019 07/10/2018 03/05/2018 07/18/2017 07/04/2017  Falls in the past year? 0 0 0 No No  Follow up - Falls prevention discussed - - -     Objective:  [name] seemed alert and oriented and he participated appropriately during our telephone visit.  Blood Pressure Weight BMI  BP Readings from Last 3 Encounters:  03/05/18 (!) 157/77  07/18/17 136/76  07/04/17 132/78   Wt Readings from Last 3 Encounters:  08/26/19 269 lb (122 kg)  03/05/18 269 lb (122 kg)  07/18/17 267 lb (121.1 kg)   BMI Readings from Last 1 Encounters:  08/26/19 36.48 kg/m    *Unable to obtain current vital signs, weight, and BMI due to telephone visit type  Hearing/Vision  . [name] did not seem to have difficulty with hearing/understanding during the telephone conversation . Reports that he has not had a formal eye exam by an eye care professional within the past year . Reports that he has not had a formal hearing evaluation within the past year *Unable to fully assess hearing and vision during telephone visit type  Cognitive Function: 6CIT Screen 08/26/2019 07/10/2018  What Year? 0 points 0 points  What month? 0 points 0 points  What time? 0 points 0 points  Count back from 20 0 points -  Months in reverse 2 points -  Repeat phrase 0 points -  Total Score 2 -   (Normal:0-7, Significant for Dysfunction: >8)  Normal Cognitive Function Screening: Yes   Immunization & Health Maintenance Record Immunization History  Administered Date(s) Administered  . DTaP 06/02/2009  . Fluad Quad(high Dose 65+) 11/18/2018  . Influenza Whole 12/06/2009  . Influenza, High Dose Seasonal PF 12/07/2015, 11/26/2016, 03/05/2018  . Influenza,inj,Quad PF,6+ Mos 01/02/2013, 01/06/2014, 01/10/2015   . Influenza,inj,quad, With Preservative 11/25/2018  . Pneumococcal Conjugate-13 02/10/2015, 11/25/2018  . Pneumococcal Polysaccharide-23 11/18/2018  . Td 06/02/2009    Health Maintenance  Topic Date Due  . COVID-19 Vaccine (1) 10/27/2019 (Originally 05/12/1941)  . TETANUS/TDAP  08/25/2020 (Originally 06/03/2019)  . INFLUENZA VACCINE  09/06/2019  . PNA vac Low Risk Adult  Completed       Assessment  This is a routine wellness examination for [name].  Health Maintenance: Due or Overdue There are no preventive care reminders to display for this patient.  [name] does not need a referral for Community Assistance: Care Management:   no Social Work:    no Prescription Assistance:  no Nutrition/Diabetes Education:  no   Plan:  Personalized Goals Goals Addressed            This Visit's Progress   . Prevent Falls   On track    Move slowly and change positions slowly to avoid falls.    . Prevent falls   On track    Stay active       Personalized Health Maintenance & Screening Recommendations  covid vaccine  Lung Cancer Screening Recommended: no (Low Dose CT Chest recommended if Age 40-80 years, 30 pack-year currently smoking OR have quit w/in past 15 years) Hepatitis C Screening recommended: no HIV Screening recommended: no  Advanced Directives: Written information was not prepared per patient's request.  Referrals & Orders No orders of the defined types were placed in this encounter.   Follow-up Plan . Follow-up with Tyler Brooklyn, FNP as planned . We Scheduled a OV with [name] for 09/16/19 for check up, labs and potentially the covid vaccine.   I have personally reviewed and noted the following in the patient's chart:   . Medical and social history . Use of alcohol, tobacco or illicit drugs  . Current medications and supplements . Functional ability and status . Nutritional status . Physical activity . Advanced directives . List of other  physicians . Hospitalizations, surgeries, and ER visits in previous 12 months . Vitals . Screenings to include cognitive, depression, and falls . Referrals and appointments  In addition, I have reviewed and discussed  with [name] certain preventive protocols, quality metrics, and best practice recommendations. A written personalized care plan for preventive services as well as general preventive health recommendations is available and can be mailed to the patient at his request.      Huntley Dec  08/26/2019

## 2019-09-07 ENCOUNTER — Other Ambulatory Visit: Payer: Self-pay | Admitting: Nurse Practitioner

## 2019-09-07 DIAGNOSIS — I10 Essential (primary) hypertension: Secondary | ICD-10-CM

## 2019-09-07 DIAGNOSIS — E559 Vitamin D deficiency, unspecified: Secondary | ICD-10-CM

## 2019-09-07 NOTE — Addendum Note (Signed)
Addended by: Antonietta Barcelona D on: 09/07/2019 02:33 PM   Modules accepted: Orders

## 2019-09-09 ENCOUNTER — Other Ambulatory Visit: Payer: Self-pay | Admitting: Family Medicine

## 2019-09-16 ENCOUNTER — Encounter: Payer: Self-pay | Admitting: Family Medicine

## 2019-09-16 ENCOUNTER — Other Ambulatory Visit: Payer: Self-pay

## 2019-09-16 ENCOUNTER — Ambulatory Visit (INDEPENDENT_AMBULATORY_CARE_PROVIDER_SITE_OTHER): Payer: Medicare Other | Admitting: Family Medicine

## 2019-09-16 VITALS — BP 149/88 | HR 85 | Temp 98.1°F | Resp 20 | Ht 72.0 in | Wt 245.0 lb

## 2019-09-16 DIAGNOSIS — M79605 Pain in left leg: Secondary | ICD-10-CM

## 2019-09-16 DIAGNOSIS — E559 Vitamin D deficiency, unspecified: Secondary | ICD-10-CM | POA: Diagnosis not present

## 2019-09-16 DIAGNOSIS — M79604 Pain in right leg: Secondary | ICD-10-CM

## 2019-09-16 DIAGNOSIS — E669 Obesity, unspecified: Secondary | ICD-10-CM

## 2019-09-16 DIAGNOSIS — M1990 Unspecified osteoarthritis, unspecified site: Secondary | ICD-10-CM | POA: Diagnosis not present

## 2019-09-16 DIAGNOSIS — E782 Mixed hyperlipidemia: Secondary | ICD-10-CM | POA: Diagnosis not present

## 2019-09-16 DIAGNOSIS — I10 Essential (primary) hypertension: Secondary | ICD-10-CM

## 2019-09-16 NOTE — Patient Instructions (Signed)
Tylenol 1,000 mg three times a day.

## 2019-09-16 NOTE — Progress Notes (Signed)
Assessment & Plan:  1. Essential hypertension - Well controlled on current regimen.  - CBC with Differential/Platelet - CMP14+EGFR  2. Mixed hyperlipidemia - Labs today to assess.  - CBC with Differential/Platelet - CMP14+EGFR - Lipid panel  3. Arthritis - Encouraged use of Tylenol instead of Ibuprofen for pain.  4. Vitamin D deficiency - Labs today to assess.  - VITAMIN D 25 Hydroxy (Vit-D Deficiency, Fractures)  5. Obesity (BMI 30.0-34.9) - Diet and exercise encouraged.   6. Bilateral leg pain - TSH - Magnesium   Return in about 6 months (around 03/18/2020) for follow-up of chronic medication conditions.  Hendricks Limes, MSN, APRN, FNP-C [facility]  Subjective:    Patient ID: [name], male    DOB: 09/26/1929, 86 y.o.   MRN: [id number]  Patient Care Team: [name], FNP as PCP - General (Family Medicine) [name], MD as Consulting Physician (Orthopedic Surgery)   Chief Complaint:  Chief Complaint  Patient presents with  . Medical Management of Chronic Issues    6 mo   . Hyperlipidemia  . Hypertension    HPI: [name] is a 86 y.o. male presenting on 09/16/2019 for Medical Management of Chronic Issues (6 mo ), Hyperlipidemia, and Hypertension  Patient has a h/o HTN, hyperlipidemia, arthritis, vitamin D deficiency, and obesity.   Patient reports the orthopedic gave him prednisone to take on a daily basis.   New complaints: Patient reports aching pain in BLE that has been going on for some time. He wears compression hose despite lack of swelling and reports it helps some. He is also taking Aleve for the pain. He has not had blood work in 2 years.    Social history:  Relevant past medical, surgical, family and social history reviewed and updated as indicated. Interim medical history since our last visit reviewed.  Allergies and medications reviewed and updated.  DATA REVIEWED: CHART IN EPIC  ROS: Negative  unless specifically indicated above in HPI.    Current Outpatient Medications:  .  clotrimazole-betamethasone (LOTRISONE) cream, APPLY TO AFFECTED AREA TWICE DAILY, Disp: 30 g, Rfl: 5 .  fish oil-omega-3 fatty acids 1000 MG capsule, Take 2 g by mouth daily.  , Disp: , Rfl:  .  furosemide (LASIX) 20 MG tablet, TAKE  (1)  TABLET TWICE A DAY., Disp: 180 tablet, Rfl: 3 .  lisinopril (ZESTRIL) 40 MG tablet, TAKE 1 TABLET DAILY, Disp: 90 tablet, Rfl: 0 .  predniSONE (DELTASONE) 5 MG tablet, Take 5 mg by mouth daily., Disp: , Rfl:  .  rosuvastatin (CRESTOR) 5 MG tablet, Take 0.5 tab on Monday, Wednesday and Friday evenings, Disp: 30 tablet, Rfl: 2 .  triamcinolone ointment (KENALOG) 0.5 %, Apply 1 application topically 2 (two) times daily., Disp: 30 g, Rfl: 0   No Known Allergies Past Medical History:  Diagnosis Date  . Hyperlipidemia   . Hypertension     Past Surgical History:  Procedure Laterality Date  . JOINT REPLACEMENT Left    hip     Social History   Socioeconomic History  . Marital status: Divorced    Spouse name: Not on file  . Number of children: 3  . Years of education: 16  . Highest education level: 11th grade  Occupational History  . Not on file  Tobacco Use  . Smoking status: Never Smoker  . Smokeless tobacco: Never Used  Vaping Use  . Vaping Use: Never used  Substance and Sexual Activity  . Alcohol  use: No  . Drug use: No  . Sexual activity: Not on file  Other Topics Concern  . Not on file  Social History Narrative  . Not on file   Social Determinants of Health   Financial Resource Strain:   . Difficulty of Paying Living Expenses:   Food Insecurity:   . Worried About Charity fundraiser in the Last Year:   . Arboriculturist in the Last Year:   Transportation Needs:   . Film/video editor (Medical):   Marland Kitchen Lack of Transportation (Non-Medical):   Physical Activity:   . Days of Exercise per Week:   . Minutes of Exercise per Session:   Stress:   .  Feeling of Stress :   Social Connections:   . Frequency of Communication with Friends and Family:   . Frequency of Social Gatherings with Friends and Family:   . Attends Religious Services:   . Active Member of Clubs or Organizations:   . Attends Archivist Meetings:   Marland Kitchen Marital Status:   Intimate Partner Violence:   . Fear of Current or Ex-Partner:   . Emotionally Abused:   Marland Kitchen Physically Abused:   . Sexually Abused:         Objective:    BP (!) 149/88 (BP Location: Left Arm, Cuff Size: Large)   Pulse 85   Temp 98.1 F (36.7 C)   Resp 20   Ht 6' (1.829 m)   Wt 245 lb (111.1 kg)   SpO2 99%   BMI 33.23 kg/m   Wt Readings from Last 3 Encounters:  09/16/19 245 lb (111.1 kg)  08/26/19 269 lb (122 kg)  03/05/18 269 lb (122 kg)    Physical Exam Vitals reviewed.  Constitutional:      General: He is not in acute distress.    Appearance: Normal appearance. He is obese. He is not ill-appearing, toxic-appearing or diaphoretic.  HENT:     Head: Normocephalic and atraumatic.  Eyes:     General: No scleral icterus.       Right eye: No discharge.        Left eye: No discharge.     Conjunctiva/sclera: Conjunctivae normal.  Cardiovascular:     Rate and Rhythm: Normal rate and regular rhythm.     Heart sounds: Normal heart sounds. No murmur heard.  No friction rub. No gallop.   Pulmonary:     Effort: Pulmonary effort is normal. No respiratory distress.     Breath sounds: Normal breath sounds. No stridor. No wheezing, rhonchi or rales.  Musculoskeletal:        General: Normal range of motion.     Cervical back: Normal range of motion.     Right lower leg: No edema.     Left lower leg: No edema.  Skin:    General: Skin is warm and dry.  Neurological:     Mental Status: He is alert and oriented to person, place, and time. Mental status is at baseline.  Psychiatric:        Mood and Affect: Mood normal.        Behavior: Behavior normal.        Thought Content:  Thought content normal.        Judgment: Judgment normal.     Lab Results  Component Value Date   TSH 4.320 10/11/2016   Lab Results  Component Value Date   WBC 7.7 07/18/2017   HGB 15.2 07/18/2017  HCT 45.6 07/18/2017   MCV 93 07/18/2017   PLT 257 07/18/2017   Lab Results  Component Value Date   NA 139 07/18/2017   K 4.5 07/18/2017   CO2 24 07/18/2017   GLUCOSE 80 07/18/2017   BUN 14 07/18/2017   CREATININE 1.10 07/18/2017   BILITOT 0.4 07/18/2017   ALKPHOS 60 07/18/2017   AST 17 07/18/2017   ALT 12 07/18/2017   PROT 6.1 07/18/2017   ALBUMIN 3.8 07/18/2017   CALCIUM 9.1 07/18/2017   Lab Results  Component Value Date   CHOL 185 07/18/2017   Lab Results  Component Value Date   HDL 54 07/18/2017   Lab Results  Component Value Date   LDLCALC 107 (H) 07/18/2017   Lab Results  Component Value Date   TRIG 122 07/18/2017   Lab Results  Component Value Date   CHOLHDL 3.4 07/18/2017   No results found for: HGBA1C

## 2019-09-17 LAB — CBC WITH DIFFERENTIAL/PLATELET
Basophils Absolute: 0 10*3/uL (ref 0.0–0.2)
Basos: 0 %
EOS (ABSOLUTE): 0 10*3/uL (ref 0.0–0.4)
Eos: 0 %
Hematocrit: 47.4 % (ref 37.5–51.0)
Hemoglobin: 16.2 g/dL (ref 13.0–17.7)
Immature Grans (Abs): 0 10*3/uL (ref 0.0–0.1)
Immature Granulocytes: 0 %
Lymphocytes Absolute: 1.4 10*3/uL (ref 0.7–3.1)
Lymphs: 14 %
MCH: 31.2 pg (ref 26.6–33.0)
MCHC: 34.2 g/dL (ref 31.5–35.7)
MCV: 91 fL (ref 79–97)
Monocytes Absolute: 0.9 10*3/uL (ref 0.1–0.9)
Monocytes: 9 %
Neutrophils Absolute: 7.6 10*3/uL — ABNORMAL HIGH (ref 1.4–7.0)
Neutrophils: 77 %
Platelets: 218 10*3/uL (ref 150–450)
RBC: 5.2 x10E6/uL (ref 4.14–5.80)
RDW: 12.6 % (ref 11.6–15.4)
WBC: 10 10*3/uL (ref 3.4–10.8)

## 2019-09-17 LAB — LIPID PANEL
Chol/HDL Ratio: 2.8 ratio (ref 0.0–5.0)
Cholesterol, Total: 184 mg/dL (ref 100–199)
HDL: 66 mg/dL (ref >39)
LDL Chol Calc (NIH): 103 mg/dL — ABNORMAL HIGH (ref 0–99)
Triglycerides: 81 mg/dL (ref 0–149)
VLDL Cholesterol Cal: 15 mg/dL (ref 5–40)

## 2019-09-17 LAB — CMP14+EGFR
ALT: 12 IU/L (ref 0–44)
AST: 16 IU/L (ref 0–40)
Albumin/Globulin Ratio: 1.7 (ref 1.2–2.2)
Albumin: 3.8 g/dL (ref 3.6–4.6)
Alkaline Phosphatase: 72 IU/L (ref 48–121)
BUN/Creatinine Ratio: 21 (ref 10–24)
BUN: 22 mg/dL (ref 8–27)
Bilirubin Total: 0.5 mg/dL (ref 0.0–1.2)
CO2: 24 mmol/L (ref 20–29)
Calcium: 9.3 mg/dL (ref 8.6–10.2)
Chloride: 100 mmol/L (ref 96–106)
Creatinine, Ser: 1.05 mg/dL (ref 0.76–1.27)
GFR calc Af Amer: 73 mL/min/{1.73_m2} (ref >59)
GFR calc non Af Amer: 63 mL/min/{1.73_m2} (ref >59)
Globulin, Total: 2.3 g/dL (ref 1.5–4.5)
Glucose: 87 mg/dL (ref 65–99)
Potassium: 4.4 mmol/L (ref 3.5–5.2)
Sodium: 139 mmol/L (ref 134–144)
Total Protein: 6.1 g/dL (ref 6.0–8.5)

## 2019-09-17 LAB — MAGNESIUM: Magnesium: 2 mg/dL (ref 1.6–2.3)

## 2019-09-17 LAB — TSH: TSH: 2.93 u[IU]/mL (ref 0.450–4.500)

## 2019-09-17 LAB — VITAMIN D 25 HYDROXY (VIT D DEFICIENCY, FRACTURES): Vit D, 25-Hydroxy: 38.6 ng/mL (ref 30.0–100.0)

## 2019-10-23 ENCOUNTER — Encounter: Payer: Self-pay | Admitting: Family Medicine

## 2019-10-23 ENCOUNTER — Other Ambulatory Visit: Payer: Self-pay

## 2019-10-23 ENCOUNTER — Ambulatory Visit (INDEPENDENT_AMBULATORY_CARE_PROVIDER_SITE_OTHER): Payer: Medicare Other | Admitting: Family Medicine

## 2019-10-23 VITALS — BP 132/74 | HR 68 | Temp 98.2°F | Resp 20 | Ht 72.0 in | Wt 245.0 lb

## 2019-10-23 DIAGNOSIS — Z23 Encounter for immunization: Secondary | ICD-10-CM

## 2019-10-23 DIAGNOSIS — L03116 Cellulitis of left lower limb: Secondary | ICD-10-CM

## 2019-10-23 DIAGNOSIS — I878 Other specified disorders of veins: Secondary | ICD-10-CM

## 2019-10-23 MED ORDER — AMOXICILLIN-POT CLAVULANATE 875-125 MG PO TABS: 1.0000 | ORAL_TABLET | Freq: Two times a day (BID) | ORAL | 0 refills | Status: DC

## 2019-10-23 MED ORDER — GABAPENTIN 300 MG PO CAPS: 300.0000 mg | ORAL_CAPSULE | Freq: Every day | ORAL | 2 refills | Status: DC

## 2019-10-23 NOTE — Progress Notes (Signed)
No chief complaint on file.   HPI  Patient presents today for recurrent cellulitis of the left lower extremity. He wears a compression stocking due to chronic edema. 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 Been chronic and he has a compression stocking he wears but he admits to eating some salty foods and does not stopped elevated during the day. Patient denies fever chills and sweats. No cough shortness of breath or upper respiratory symptoms.  PMH: Smoking status noted ROS: Per HPI  Objective: BP 132/74   Pulse 68   Temp 98.2 F (36.8 C) (Temporal)   Resp 20   Ht 6' (1.829 m)   Wt 245 lb (111.1 kg)   SpO2 99%   BMI 33.23 kg/m  Gen: NAD, alert, cooperative with exam HEENT: NCAT, EOMI, PERRL CV: RRR, good S1/S2, no murmur Resp: CTABL, no wheezes, non-labored Ext: 2+ edema of the left lower leg going midway to the knee. There is mild erythema and on the posterior side there is a minimal skin tear. Cellulitis, leg leg Neuro: Alert and oriented, No gross deficits  Assessment and plan:  1. Venous stasis of lower extremity   2. Need for immunization against influenza   3. Cellulitis of left lower extremity     Meds ordered this encounter  Medications  . gabapentin (NEURONTIN) 300 MG capsule    Sig: Take 1 capsule (300 mg total) by mouth at bedtime.    Dispense:  30 capsule    Refill:  2  . amoxicillin-clavulanate (AUGMENTIN) 875-125 MG tablet    Sig: Take 1 tablet by mouth 2 (two) times daily. Take all of this medication    Dispense:  20 tablet    Refill:  0    Orders Placed This Encounter  Procedures  . Flu Vaccine QUAD High Dose(Fluad)    Follow up with Ms. Blanch Media, his PCP in 1 month.Claretta Fraise, MD

## 2019-10-29 ENCOUNTER — Other Ambulatory Visit: Payer: Self-pay | Admitting: Family Medicine

## 2019-10-29 ENCOUNTER — Telehealth: Payer: Self-pay | Admitting: Family Medicine

## 2019-10-29 MED ORDER — GABAPENTIN 300 MG PO CAPS
300.0000 mg | ORAL_CAPSULE | Freq: Two times a day (BID) | ORAL | 2 refills | Status: DC
Start: 2019-10-29 — End: 2020-04-29

## 2019-10-29 NOTE — Telephone Encounter (Signed)
Called and spoke with patient and he states it is the Gabapentin for pain that was not working. Would like to try something else to get some relief. Please advise.

## 2019-10-29 NOTE — Telephone Encounter (Signed)
I started him on a low dose. He just needs to increase the dose. Start by taking two gabapentin at bedtime instead of one

## 2019-10-30 NOTE — Telephone Encounter (Signed)
Pt aware of provider feedback and voiced understanding. 

## 2019-11-05 DIAGNOSIS — L570 Actinic keratosis: Secondary | ICD-10-CM | POA: Diagnosis not present

## 2019-11-05 DIAGNOSIS — D3611 Benign neoplasm of peripheral nerves and autonomic nervous system of face, head, and neck: Secondary | ICD-10-CM | POA: Diagnosis not present

## 2019-11-05 DIAGNOSIS — D485 Neoplasm of uncertain behavior of skin: Secondary | ICD-10-CM | POA: Diagnosis not present

## 2019-12-09 ENCOUNTER — Other Ambulatory Visit: Payer: Self-pay | Admitting: Nurse Practitioner

## 2019-12-09 DIAGNOSIS — I10 Essential (primary) hypertension: Secondary | ICD-10-CM

## 2019-12-14 DIAGNOSIS — M170 Bilateral primary osteoarthritis of knee: Secondary | ICD-10-CM | POA: Diagnosis not present

## 2020-01-08 DIAGNOSIS — M25551 Pain in right hip: Secondary | ICD-10-CM | POA: Diagnosis not present

## 2020-02-16 ENCOUNTER — Other Ambulatory Visit: Payer: Self-pay | Admitting: *Deleted

## 2020-02-16 DIAGNOSIS — I10 Essential (primary) hypertension: Secondary | ICD-10-CM

## 2020-02-16 DIAGNOSIS — I878 Other specified disorders of veins: Secondary | ICD-10-CM

## 2020-02-16 MED ORDER — FUROSEMIDE 20 MG PO TABS: ORAL_TABLET | ORAL | 0 refills | Status: DC

## 2020-03-10 ENCOUNTER — Other Ambulatory Visit: Payer: Self-pay | Admitting: Family Medicine

## 2020-03-10 DIAGNOSIS — I10 Essential (primary) hypertension: Secondary | ICD-10-CM

## 2020-03-22 ENCOUNTER — Ambulatory Visit: Payer: Medicare Other | Admitting: Family Medicine

## 2020-03-23 ENCOUNTER — Encounter: Payer: Self-pay | Admitting: Family Medicine

## 2020-03-31 ENCOUNTER — Other Ambulatory Visit: Payer: Self-pay

## 2020-03-31 ENCOUNTER — Encounter: Payer: Self-pay | Admitting: Family Medicine

## 2020-03-31 ENCOUNTER — Ambulatory Visit (INDEPENDENT_AMBULATORY_CARE_PROVIDER_SITE_OTHER): Payer: Medicare Other | Admitting: Family Medicine

## 2020-03-31 VITALS — BP 170/89 | HR 91 | Temp 97.1°F | Ht 73.0 in | Wt 243.0 lb

## 2020-03-31 DIAGNOSIS — E669 Obesity, unspecified: Secondary | ICD-10-CM

## 2020-03-31 DIAGNOSIS — E7800 Pure hypercholesterolemia, unspecified: Secondary | ICD-10-CM

## 2020-03-31 DIAGNOSIS — E559 Vitamin D deficiency, unspecified: Secondary | ICD-10-CM

## 2020-03-31 DIAGNOSIS — Z01818 Encounter for other preprocedural examination: Secondary | ICD-10-CM

## 2020-03-31 DIAGNOSIS — I10 Essential (primary) hypertension: Secondary | ICD-10-CM | POA: Diagnosis not present

## 2020-03-31 LAB — CBC WITH DIFFERENTIAL/PLATELET
Basophils Absolute: 0.1 10*3/uL (ref 0.0–0.2)
Basos: 1 %
EOS (ABSOLUTE): 0 10*3/uL (ref 0.0–0.4)
Eos: 1 %
Hematocrit: 49 % (ref 37.5–51.0)
Hemoglobin: 16.4 g/dL (ref 13.0–17.7)
Immature Grans (Abs): 0 10*3/uL (ref 0.0–0.1)
Immature Granulocytes: 0 %
Lymphocytes Absolute: 2.7 10*3/uL (ref 0.7–3.1)
Lymphs: 31 %
MCH: 31.4 pg (ref 26.6–33.0)
MCHC: 33.5 g/dL (ref 31.5–35.7)
MCV: 94 fL (ref 79–97)
Monocytes Absolute: 1.1 10*3/uL — ABNORMAL HIGH (ref 0.1–0.9)
Monocytes: 13 %
Neutrophils Absolute: 4.8 10*3/uL (ref 1.4–7.0)
Neutrophils: 54 %
Platelets: 264 10*3/uL (ref 150–450)
RBC: 5.22 x10E6/uL (ref 4.14–5.80)
RDW: 12.2 % (ref 11.6–15.4)
WBC: 8.7 10*3/uL (ref 3.4–10.8)

## 2020-03-31 LAB — CMP14+EGFR
ALT: 10 IU/L (ref 0–44)
AST: 19 IU/L (ref 0–40)
Albumin/Globulin Ratio: 1.3 (ref 1.2–2.2)
Albumin: 3.9 g/dL (ref 3.6–4.6)
Alkaline Phosphatase: 72 IU/L (ref 44–121)
BUN/Creatinine Ratio: 14 (ref 10–24)
BUN: 16 mg/dL (ref 8–27)
Bilirubin Total: 0.7 mg/dL (ref 0.0–1.2)
CO2: 23 mmol/L (ref 20–29)
Calcium: 9.2 mg/dL (ref 8.6–10.2)
Chloride: 99 mmol/L (ref 96–106)
Creatinine, Ser: 1.15 mg/dL (ref 0.76–1.27)
GFR calc Af Amer: 65 mL/min/{1.73_m2} (ref >59)
GFR calc non Af Amer: 56 mL/min/{1.73_m2} — ABNORMAL LOW (ref >59)
Globulin, Total: 2.9 g/dL (ref 1.5–4.5)
Glucose: 86 mg/dL (ref 65–99)
Potassium: 4.8 mmol/L (ref 3.5–5.2)
Sodium: 139 mmol/L (ref 134–144)
Total Protein: 6.8 g/dL (ref 6.0–8.5)

## 2020-03-31 LAB — COAGUCHEK XS/INR WAIVED
INR: 1.1 (ref 0.9–1.1)
Prothrombin Time: 13 s

## 2020-03-31 LAB — BAYER DCA HB A1C WAIVED: HB A1C (BAYER DCA - WAIVED): 5.1 % (ref <7.0)

## 2020-03-31 NOTE — Progress Notes (Signed)
Assessment & Plan:  1. Encounter for preoperative assessment Labs and urine obtained today.  As long as this looks good, patient will be cleared for surgery. - CBC with Differential/Platelet - CMP14+EGFR - Bayer DCA Hb A1c Waived - CoaguChek XS/INR Waived - Urinalysis, Complete  2. Essential hypertension Well controlled on current regimen.  Education provided on the DASH diet.  Encouraged to continue weight loss efforts.  Continue monitoring BP at home.  3. Pure hypercholesterolemia Well controlled on current regimen.   4. Vitamin D deficiency Well controlled on current regimen.   5. Obesity (BMI 30.0-34.9) Continue weight loss efforts.   Follow up plan: Return in about 6 weeks (around 05/12/2020) for HTN.  Hendricks Limes, MSN, APRN, FNP-C [facility]  Subjective:   Patient ID: [name], male    DOB: 1929/08/05, 87 y.o.   MRN: [id number]  HPI: [name] is a 87 y.o. male presenting on 03/31/2020 for surgical clearance  Patient is here for surgical clearance for a right total hip arthroplasty by Dr. Wynelle Link.  Patient has a history of hypertension, hyperlipidemia, and a vitamin D deficiency; all of which are well controlled.  Patient is obese, but has been trying to lose weight.  He has lost 26 pounds in the past seven months.  He reports his only problem is his hip.  Patient reports he had lab work completed with the [facility] recently, but I do not have access to these records.   ROS: Negative unless specifically indicated above in HPI.   Relevant past medical history reviewed and updated as indicated.   Allergies and medications reviewed and updated.   Current Outpatient Medications:  .  fish oil-omega-3 fatty acids 1000 MG capsule, Take 2 g by mouth daily., Disp: , Rfl:  .  furosemide (LASIX) 20 MG tablet, TAKE  (1)  TABLET TWICE A DAY., Disp: 180 tablet, Rfl: 0 .  gabapentin (NEURONTIN) 300 MG capsule, Take 1 capsule (300 mg total) by mouth  2 (two) times daily., Disp: 60 capsule, Rfl: 2 .  lisinopril (ZESTRIL) 40 MG tablet, TAKE 1 TABLET DAILY, Disp: 90 tablet, Rfl: 0 .  rosuvastatin (CRESTOR) 5 MG tablet, Take 0.5 tab on Monday, Wednesday and Friday evenings, Disp: 30 tablet, Rfl: 2  No Known Allergies  Objective:   BP (!) 170/89   Pulse 91   Temp (!) 97.1 F (36.2 C) (Temporal)   Ht 6' 1"  (1.854 m)   Wt 243 lb (110.2 kg)   SpO2 98%   BMI 32.06 kg/m    Physical Exam Vitals reviewed.  Constitutional:      General: He is not in acute distress.    Appearance: Normal appearance. He is obese. He is not ill-appearing, toxic-appearing or diaphoretic.  HENT:     Head: Normocephalic and atraumatic.     Right Ear: Tympanic membrane, ear canal and external ear normal. There is no impacted cerumen.     Left Ear: Tympanic membrane, ear canal and external ear normal. There is no impacted cerumen.     Nose: Nose normal. No congestion or rhinorrhea.     Mouth/Throat:     Mouth: Mucous membranes are moist.     Pharynx: Oropharynx is clear. No oropharyngeal exudate or posterior oropharyngeal erythema.  Eyes:     General: No scleral icterus.       Right eye: No discharge.        Left eye: No discharge.     Conjunctiva/sclera: Conjunctivae normal.  Pupils: Pupils are equal, round, and reactive to light.  Cardiovascular:     Rate and Rhythm: Normal rate and regular rhythm.     Heart sounds: Normal heart sounds. No murmur heard. No friction rub. No gallop.   Pulmonary:     Effort: Pulmonary effort is normal. No respiratory distress.     Breath sounds: Normal breath sounds. No stridor. No wheezing, rhonchi or rales.  Abdominal:     General: Abdomen is flat. Bowel sounds are normal. There is no distension.     Palpations: Abdomen is soft. There is no mass.     Tenderness: There is no abdominal tenderness. There is no guarding or rebound.     Hernia: No hernia is present.  Musculoskeletal:        General: Normal range of  motion.     Cervical back: Normal range of motion and neck supple. No rigidity. No muscular tenderness.     Right lower leg: No edema.     Left lower leg: No edema.  Lymphadenopathy:     Cervical: No cervical adenopathy.  Skin:    General: Skin is warm and dry.     Capillary Refill: Capillary refill takes less than 2 seconds.  Neurological:     General: No focal deficit present.     Mental Status: He is alert and oriented to person, place, and time. Mental status is at baseline.     Gait: Gait abnormal (walking with cane).  Psychiatric:        Mood and Affect: Mood normal.        Behavior: Behavior normal.        Thought Content: Thought content normal.        Judgment: Judgment normal.

## 2020-03-31 NOTE — Patient Instructions (Signed)
DASH Eating Plan DASH stands for Dietary Approaches to Stop Hypertension. The DASH eating plan is a healthy eating plan that has been shown to:  Reduce high blood pressure (hypertension).  Reduce your risk for type 2 diabetes, heart disease, and stroke.  Help with weight loss. What are tips for following this plan? Reading food labels  Check food labels for the amount of salt (sodium) per serving. Choose foods with less than 5 percent of the Daily Value of sodium. Generally, foods with less than 300 milligrams (mg) of sodium per serving fit into this eating plan.  To find whole grains, look for the word "whole" as the first word in the ingredient list. Shopping  Buy products labeled as "low-sodium" or "no salt added."  Buy fresh foods. Avoid canned foods and pre-made or frozen meals. Cooking  Avoid adding salt when cooking. Use salt-free seasonings or herbs instead of table salt or sea salt. Check with your health care provider or pharmacist before using salt substitutes.  Do not fry foods. Cook foods using healthy methods such as baking, boiling, grilling, roasting, and broiling instead.  Cook with heart-healthy oils, such as olive, canola, avocado, soybean, or sunflower oil. Meal planning  Eat a balanced diet that includes: ? 4 or more servings of fruits and 4 or more servings of vegetables each day. Try to fill one-half of your plate with fruits and vegetables. ? 6-8 servings of whole grains each day. ? Less than 6 oz (170 g) of lean meat, poultry, or fish each day. A 3-oz (85-g) serving of meat is about the same size as a deck of cards. One egg equals 1 oz (28 g). ? 2-3 servings of low-fat dairy each day. One serving is 1 cup (237 mL). ? 1 serving of nuts, seeds, or beans 5 times each week. ? 2-3 servings of heart-healthy fats. Healthy fats called omega-3 fatty acids are found in foods such as walnuts, flaxseeds, fortified milks, and eggs. These fats are also found in  cold-water fish, such as sardines, salmon, and mackerel.  Limit how much you eat of: ? Canned or prepackaged foods. ? Food that is high in trans fat, such as some fried foods. ? Food that is high in saturated fat, such as fatty meat. ? Desserts and other sweets, sugary drinks, and other foods with added sugar. ? Full-fat dairy products.  Do not salt foods before eating.  Do not eat more than 4 egg yolks a week.  Try to eat at least 2 vegetarian meals a week.  Eat more home-cooked food and less restaurant, buffet, and fast food.   Lifestyle  When eating at a restaurant, ask that your food be prepared with less salt or no salt, if possible.  If you drink alcohol: ? Limit how much you use to:  0-1 drink a day for women who are not pregnant.  0-2 drinks a day for men. ? Be aware of how much alcohol is in your drink. In the [place] one drink equals one 12 oz bottle of beer (355 mL), one 5 oz glass of wine (148 mL), or one 1 oz glass of hard liquor (44 mL). General information  Avoid eating more than 2,300 mg of salt a day. If you have hypertension, you may need to reduce your sodium intake to 1,500 mg a day.  Work with your health care provider to maintain a healthy body weight or to lose weight. Ask what an ideal weight is for you.    Get at least 30 minutes of exercise that causes your heart to beat faster (aerobic exercise) most days of the week. Activities may include walking, swimming, or biking.  Work with your health care provider or dietitian to adjust your eating plan to your individual calorie needs. What foods should I eat? Fruits All fresh, dried, or frozen fruit. Canned fruit in natural juice (without added sugar). Vegetables Fresh or frozen vegetables (raw, steamed, roasted, or grilled). Low-sodium or reduced-sodium tomato and vegetable juice. Low-sodium or reduced-sodium tomato sauce and tomato paste. Low-sodium or reduced-sodium canned vegetables. Grains Whole-grain  or whole-wheat bread. Whole-grain or whole-wheat pasta. Brown rice. Oatmeal. Quinoa. Bulgur. Whole-grain and low-sodium cereals. Pita bread. Low-fat, low-sodium crackers. Whole-wheat flour tortillas. Meats and other proteins Skinless chicken or turkey. Ground chicken or turkey. Pork with fat trimmed off. Fish and seafood. Egg whites. Dried beans, peas, or lentils. Unsalted nuts, nut butters, and seeds. Unsalted canned beans. Lean cuts of beef with fat trimmed off. Low-sodium, lean precooked or cured meat, such as sausages or meat loaves. Dairy Low-fat (1%) or fat-free (skim) milk. Reduced-fat, low-fat, or fat-free cheeses. Nonfat, low-sodium ricotta or cottage cheese. Low-fat or nonfat yogurt. Low-fat, low-sodium cheese. Fats and oils Soft margarine without trans fats. Vegetable oil. Reduced-fat, low-fat, or light mayonnaise and salad dressings (reduced-sodium). Canola, safflower, olive, avocado, soybean, and sunflower oils. Avocado. Seasonings and condiments Herbs. Spices. Seasoning mixes without salt. Other foods Unsalted popcorn and pretzels. Fat-free sweets. The items listed above may not be a complete list of foods and beverages you can eat. Contact a dietitian for more information. What foods should I avoid? Fruits Canned fruit in a light or heavy syrup. Fried fruit. Fruit in cream or butter sauce. Vegetables Creamed or fried vegetables. Vegetables in a cheese sauce. Regular canned vegetables (not low-sodium or reduced-sodium). Regular canned tomato sauce and paste (not low-sodium or reduced-sodium). Regular tomato and vegetable juice (not low-sodium or reduced-sodium). Pickles. Olives. Grains Baked goods made with fat, such as croissants, muffins, or some breads. Dry pasta or rice meal packs. Meats and other proteins Fatty cuts of meat. Ribs. Fried meat. Bacon. Bologna, salami, and other precooked or cured meats, such as sausages or meat loaves. Fat from the back of a pig (fatback).  Bratwurst. Salted nuts and seeds. Canned beans with added salt. Canned or smoked fish. Whole eggs or egg yolks. Chicken or turkey with skin. Dairy Whole or 2% milk, cream, and half-and-half. Whole or full-fat cream cheese. Whole-fat or sweetened yogurt. Full-fat cheese. Nondairy creamers. Whipped toppings. Processed cheese and cheese spreads. Fats and oils Butter. Stick margarine. Lard. Shortening. Ghee. Bacon fat. Tropical oils, such as coconut, palm kernel, or palm oil. Seasonings and condiments Onion salt, garlic salt, seasoned salt, table salt, and sea salt. Worcestershire sauce. Tartar sauce. Barbecue sauce. Teriyaki sauce. Soy sauce, including reduced-sodium. Steak sauce. Canned and packaged gravies. Fish sauce. Oyster sauce. Cocktail sauce. Store-bought horseradish. Ketchup. Mustard. Meat flavorings and tenderizers. Bouillon cubes. Hot sauces. Pre-made or packaged marinades. Pre-made or packaged taco seasonings. Relishes. Regular salad dressings. Other foods Salted popcorn and pretzels. The items listed above may not be a complete list of foods and beverages you should avoid. Contact a dietitian for more information. Where to find more information  National Heart, Lung, and Blood Institute: www.nhlbi.nih.gov  American Heart Association: www.heart.org  Academy of Nutrition and Dietetics: www.eatright.org  National Kidney Foundation: www.kidney.org Summary  The DASH eating plan is a healthy eating plan that has been shown to reduce high   blood pressure (hypertension). It may also reduce your risk for type 2 diabetes, heart disease, and stroke.  When on the DASH eating plan, aim to eat more fresh fruits and vegetables, whole grains, lean proteins, low-fat dairy, and heart-healthy fats.  With the DASH eating plan, you should limit salt (sodium) intake to 2,300 mg a day. If you have hypertension, you may need to reduce your sodium intake to 1,500 mg a day.  Work with your health care  provider or dietitian to adjust your eating plan to your individual calorie needs. This information is not intended to replace advice given to you by your health care provider. Make sure you discuss any questions you have with your health care provider. Document Revised: 12/26/2018 Document Reviewed: 12/26/2018 Elsevier Patient Education  2021 Elsevier Inc.   

## 2020-04-21 ENCOUNTER — Telehealth: Payer: Self-pay

## 2020-04-21 DIAGNOSIS — Z01818 Encounter for other preprocedural examination: Secondary | ICD-10-CM

## 2020-04-21 NOTE — Telephone Encounter (Signed)
Son came by the office--he says that [facility] has not received surgical clearance form. Please re-fax form or a letter that clears patient for surgery. Son asked for a call back when done.

## 2020-04-21 NOTE — Telephone Encounter (Signed)
Son aware we are still awaiting on patient to come leave urine

## 2020-04-22 ENCOUNTER — Other Ambulatory Visit: Payer: Medicare Other

## 2020-04-22 ENCOUNTER — Other Ambulatory Visit: Payer: Self-pay

## 2020-04-22 DIAGNOSIS — Z01818 Encounter for other preprocedural examination: Secondary | ICD-10-CM | POA: Diagnosis not present

## 2020-04-22 LAB — URINALYSIS, COMPLETE
Bilirubin, UA: NEGATIVE
Glucose, UA: NEGATIVE
Ketones, UA: NEGATIVE
Leukocytes,UA: NEGATIVE
Nitrite, UA: NEGATIVE
Protein,UA: NEGATIVE
RBC, UA: NEGATIVE
Specific Gravity, UA: 1.02 (ref 1.005–1.030)
Urobilinogen, Ur: 2 mg/dL — ABNORMAL HIGH (ref 0.2–1.0)
pH, UA: 5.5 (ref 5.0–7.5)

## 2020-04-22 LAB — MICROSCOPIC EXAMINATION
Bacteria, UA: NONE SEEN
Epithelial Cells (non renal): NONE SEEN /hpf (ref 0–10)
RBC, Urine: NONE SEEN /hpf (ref 0–2)
WBC, UA: NONE SEEN /hpf (ref 0–5)

## 2020-04-22 NOTE — Patient Instructions (Addendum)
DUE TO COVID-19 ONLY ONE VISITOR IS ALLOWED TO COME WITH YOU AND STAY IN THE WAITING ROOM ONLY DURING PRE OP AND PROCEDURE.   IF YOU WILL BE ADMITTED INTO THE HOSPITAL YOU ARE ALLOWED ONLY TWO SUPPORT PEOPLE DURING VISITATION HOURS ONLY (10AM -8PM)   . The support person(s) may change daily. . The support person(s) must pass our screening, gel in and out, and wear a mask at all times, including in the patient's room. . Patients must also wear a mask when staff or their support person are in the room.  No visitors under the age of 79. Any visitor under the age of 49 must be accompanied by an adult.    COVID SWAB TESTING MUST BE COMPLETED ON:   Saturday, 04-30-20 @ 11:00 AM   [address] [place], [place] [address]  (Must self quarantine after testing. Follow instructions on handout.)        Your procedure is scheduled on: Wednesday, 05-04-20   Report to [facility] Main  Entrance    Report to admitting at 10:00 AM   Call this number if you have problems the morning of surgery [phone]   Do not eat food :After Midnight.   May have liquids until 9:30 AM  day of surgery  CLEAR LIQUID DIET  Foods Allowed                                                                     Foods Excluded  Water, Black Coffee and tea, regular and decaf           liquids that you cannot  Plain Jell-O in any flavor  (No red)                                 see through such as: Fruit ices (not with fruit pulp)                                      milk, soups, orange juice              Iced Popsicles (No red)                                      All solid food                                   Apple juices Sports drinks like Gatorade (No red) Lightly seasoned clear broth or consume(fat free) Sugar, honey syrup     Complete one Ensure drink the morning of surgery at  9:30 AM the day of surgery.      1. The day of surgery:  ? Drink ONE (1) Pre-Surgery Clear Ensure or G2 by am the morning  of surgery. Drink in one sitting. Do not sip.  ? This drink was given to you during your hospital  pre-op appointment visit. ? Nothing else to drink after completing the  Pre-Surgery Clear Ensure or G2.          If you have questions, please contact your surgeon's office.     Oral Hygiene is also important to reduce your risk of infection.                                    Remember - BRUSH YOUR TEETH THE MORNING OF SURGERY WITH YOUR REGULAR TOOTHPASTE   Do NOT smoke after Midnight   Take these medicines the morning of surgery with A SIP OF WATER: None                               You may not have any metal on your body including jewelry, and body piercings             Do not wear lotions, powders, perfumes/cologne, or deodorant             Men may shave face and neck.   Do not bring valuables to the hospital. [place].   Contacts, dentures or bridgework may not be worn into surgery.   Bring small overnight bag day of surgery.   Special Instructions: Bring a copy of your healthcare power of attorney and living will documents         the day of surgery if you haven't  scanned them in before.              Please read over the following fact sheets you were given: IF YOU HAVE QUESTIONS ABOUT YOUR PRE OP INSTRUCTIONS PLEASE CALL  [facility] - Preparing for Surgery Before surgery, you can play an important role.  Because skin is not sterile, your skin needs to be as free of germs as possible.  You can reduce the number of germs on your skin by washing with CHG (chlorahexidine gluconate) soap before surgery.  CHG is an antiseptic cleaner which kills germs and bonds with the skin to continue killing germs even after washing. Please DO NOT use if you have an allergy to CHG or antibacterial soaps.  If your skin becomes reddened/irritated stop using the CHG and inform your nurse when you arrive at Short Stay. Do not  shave (including legs and underarms) for at least 48 hours prior to the first CHG shower.  You may shave your face/neck.  Please follow these instructions carefully:  1.  Shower with CHG Soap the night before surgery and the  morning of surgery.  2.  If you choose to wash your hair, wash your hair first as usual with your normal  shampoo.  3.  After you shampoo, rinse your hair and body thoroughly to remove the shampoo.                             4.  Use CHG as you would any other liquid soap.  You can apply chg directly to the skin and wash.  Gently with a scrungie or clean washcloth.  5.  Apply the CHG Soap to your body ONLY FROM THE NECK DOWN.   Do   not use on face/ open  Wound or open sores. Avoid contact with eyes, ears mouth and   genitals (private parts).                       Wash face,  Genitals (private parts) with your normal soap.             6.  Wash thoroughly, paying special attention to the area where your    surgery  will be performed.  7.  Thoroughly rinse your body with warm water from the neck down.  8.  DO NOT shower/wash with your normal soap after using and rinsing off the CHG Soap.                9.  Pat yourself dry with a clean towel.            10.  Wear clean pajamas.            11.  Place clean sheets on your bed the night of your first shower and do not  sleep with pets. Day of Surgery : Do not apply any lotions/deodorants the morning of surgery.  Please wear clean clothes to the hospital/surgery center.  FAILURE TO FOLLOW THESE INSTRUCTIONS MAY RESULT IN THE CANCELLATION OF YOUR SURGERY  PATIENT SIGNATURE_________________________________  NURSE SIGNATURE__________________________________  ________________________________________________________________________   [name]  An incentive spirometer is a tool that can help keep your lungs clear and active. This tool measures how well you are filling your lungs with each  breath. Taking long deep breaths may help reverse or decrease the chance of developing breathing (pulmonary) problems (especially infection) following:  A long period of time when you are unable to move or be active. BEFORE THE PROCEDURE   If the spirometer includes an indicator to show your best effort, your nurse or respiratory therapist will set it to a desired goal.  If possible, sit up straight or lean slightly forward. Try not to slouch.  Hold the incentive spirometer in an upright position. INSTRUCTIONS FOR USE  1. Sit on the edge of your bed if possible, or sit up as far as you can in bed or on a chair. 2. Hold the incentive spirometer in an upright position. 3. Breathe out normally. 4. Place the mouthpiece in your mouth and seal your lips tightly around it. 5. Breathe in slowly and as deeply as possible, raising the piston or the ball toward the top of the column. 6. Hold your breath for 3-5 seconds or for as long as possible. Allow the piston or ball to fall to the bottom of the column. 7. Remove the mouthpiece from your mouth and breathe out normally. 8. Rest for a few seconds and repeat Steps 1 through 7 at least 10 times every 1-2 hours when you are awake. Take your time and take a few normal breaths between deep breaths. 9. The spirometer may include an indicator to show your best effort. Use the indicator as a goal to work toward during each repetition. 10. After each set of 10 deep breaths, practice coughing to be sure your lungs are clear. If you have an incision (the cut made at the time of surgery), support your incision when coughing by placing a pillow or rolled up towels firmly against it. Once you are able to get out of bed, walk around indoors and cough well. You may stop using the incentive spirometer when instructed by your caregiver.  RISKS AND COMPLICATIONS  Take your time  so you do not get dizzy or light-headed.  If you are in pain, you may need to take or ask  for pain medication before doing incentive spirometry. It is harder to take a deep breath if you are having pain. AFTER USE  Rest and breathe slowly and easily.  It can be helpful to keep track of a log of your progress. Your caregiver can provide you with a simple table to help with this. If you are using the spirometer at home, follow these instructions: [place] IF:   You are having difficultly using the spirometer.  You have trouble using the spirometer as often as instructed.  Your pain medication is not giving enough relief while using the spirometer.  You develop fever of 100.5 F (38.1 C) or higher. SEEK IMMEDIATE MEDICAL CARE IF:   You cough up bloody sputum that had not been present before.  You develop fever of 102 F (38.9 C) or greater.  You develop worsening pain at or near the incision site. MAKE SURE YOU:   Understand these instructions.  Will watch your condition.  Will get help right away if you are not doing well or get worse. Document Released: 06/04/2006 Document Revised: 04/16/2011 Document Reviewed: 08/05/2006 Exit[employer] Patient Information 2014 [employer], [place].   ________________________________________________________________________  WHAT IS A BLOOD TRANSFUSION? Blood Transfusion Information  A transfusion is the replacement of blood or some of its parts. Blood is made up of multiple cells which provide different functions.  Red blood cells carry oxygen and are used for blood loss replacement.  White blood cells fight against infection.  Platelets control bleeding.  Plasma helps clot blood.  Other blood products are available for specialized needs, such as hemophilia or other clotting disorders. BEFORE THE TRANSFUSION  Who gives blood for transfusions?   Healthy volunteers who are fully evaluated to make sure their blood is safe. This is blood bank blood. Transfusion therapy is the safest it has ever been in the practice of  medicine. Before blood is taken from a donor, a complete history is taken to make sure that person has no history of diseases nor engages in risky social behavior (examples are intravenous drug use or sexual activity with multiple partners). The donor's travel history is screened to minimize risk of transmitting infections, such as malaria. The donated blood is tested for signs of infectious diseases, such as HIV and hepatitis. The blood is then tested to be sure it is compatible with you in order to minimize the chance of a transfusion reaction. If you or a relative donates blood, this is often done in anticipation of surgery and is not appropriate for emergency situations. It takes many days to process the donated blood. RISKS AND COMPLICATIONS Although transfusion therapy is very safe and saves many lives, the main dangers of transfusion include:   Getting an infectious disease.  Developing a transfusion reaction. This is an allergic reaction to something in the blood you were given. Every precaution is taken to prevent this. The decision to have a blood transfusion has been considered carefully by your caregiver before blood is given. Blood is not given unless the benefits outweigh the risks. AFTER THE TRANSFUSION  Right after receiving a blood transfusion, you will usually feel much better and more energetic. This is especially true if your red blood cells have gotten low (anemic). The transfusion raises the level of the red blood cells which carry oxygen, and this usually causes an energy increase.  The  nurse administering the transfusion will monitor you carefully for complications. HOME CARE INSTRUCTIONS  No special instructions are needed after a transfusion. You may find your energy is better. Speak with your caregiver about any limitations on activity for underlying diseases you may have. SEEK MEDICAL CARE IF:   Your condition is not improving after your transfusion.  You develop  redness or irritation at the intravenous (IV) site. SEEK IMMEDIATE MEDICAL CARE IF:  Any of the following symptoms occur over the next 12 hours:  Shaking chills.  You have a temperature by mouth above 102 F (38.9 C), not controlled by medicine.  Chest, back, or muscle pain.  People around you feel you are not acting correctly or are confused.  Shortness of breath or difficulty breathing.  Dizziness and fainting.  You get a rash or develop hives.  You have a decrease in urine output.  Your urine turns a dark color or changes to pink, red, or brown. Any of the following symptoms occur over the next 10 days:  You have a temperature by mouth above 102 F (38.9 C), not controlled by medicine.  Shortness of breath.  Weakness after normal activity.  The white part of the eye turns yellow (jaundice).  You have a decrease in the amount of urine or are urinating less often.  Your urine turns a dark color or changes to pink, red, or brown. Document Released: 01/20/2000 Document Revised: 04/16/2011 Document Reviewed: 09/08/2007 [facility] Patient Information 2014 [place], [place].  _______________________________________________________________________

## 2020-04-22 NOTE — Progress Notes (Addendum)
COVID Vaccine Completed: x1 Date COVID Vaccine completed: 03-15-20 Has received booster: COVID vaccine manufacturer:     Moderna      Date of COVID positive in last 90 days:  N/A  PCP - Hendricks Limes, FNP Cardiologist - N/A  Medical clearance on chart dated 04-26-20 by Hendricks Limes, FNP  Chest x-ray - N/A EKG - 04-27-20 Epic Stress Test -  ECHO -  Cardiac Cath -  Pacemaker/ICD device last checked: Spinal Cord Stimulator:  Sleep Study - N/A CPAP -   Fasting Blood Sugar - N/A Checks Blood Sugar _____ times a day  Blood Thinner Instructions:  N/A Aspirin Instructions: Last Dose:  Activity level:  Can go up a flight of stairs and perform activities of daily living without stopping and without symptoms of chest pain or shortness of breath.  Pt lives alone and has to climb stairs at home.     Anesthesia review: Afib on EKG at PAT appt.  Patient denies shortness of breath, fever, cough and chest pain at PAT appointment   Patient verbalized understanding of instructions that were given to them at the PAT appointment. Patient was also instructed that they will need to review over the PAT instructions again at home before surgery.

## 2020-04-27 ENCOUNTER — Other Ambulatory Visit: Payer: Self-pay

## 2020-04-27 ENCOUNTER — Encounter (HOSPITAL_COMMUNITY): Payer: Self-pay

## 2020-04-27 ENCOUNTER — Encounter (HOSPITAL_COMMUNITY)
Admission: RE | Admit: 2020-04-27 | Discharge: 2020-04-27 | Disposition: A | Discharge status: Home / Self Care | Payer: Medicare Other | Source: Ambulatory Visit | Attending: Orthopedic Surgery | Admitting: Orthopedic Surgery

## 2020-04-27 DIAGNOSIS — Z01818 Encounter for other preprocedural examination: Secondary | ICD-10-CM | POA: Insufficient documentation

## 2020-04-27 HISTORY — DX: Unspecified osteoarthritis, unspecified site: M19.90

## 2020-04-27 HISTORY — DX: Unspecified malignant neoplasm of skin, unspecified: C44.90

## 2020-04-27 LAB — SURGICAL PCR SCREEN
MRSA, PCR: NEGATIVE
Staphylococcus aureus: NEGATIVE

## 2020-04-28 ENCOUNTER — Telehealth: Payer: Self-pay

## 2020-04-28 ENCOUNTER — Encounter (HOSPITAL_COMMUNITY): Payer: Self-pay | Admitting: Physician Assistant

## 2020-04-28 DIAGNOSIS — I4891 Unspecified atrial fibrillation: Secondary | ICD-10-CM

## 2020-04-28 HISTORY — DX: Unspecified atrial fibrillation: I48.91

## 2020-04-28 NOTE — Telephone Encounter (Signed)
REFERRAL REQUEST Telephone Note  Have you been seen at our office for this problem? Pt had surgical clearance appt in feb. (Advise that they may need an appointment with their PCP before a referral can be done)  Reason for Referral:he has surgery scheduled for march 30 with ortho. They found something on his ekg  and want him referred to cardiology Referral discussed with patient: no Best contact number of patient for referral team: [phone]  Has patient been seen by a specialist for this issue before:  no Patient provider preference for referral: na Patient location preference for referral: na   Patient notified that referrals can take up to a week or longer to process. If they haven't heard anything within a week they should call back and speak with the referral department.

## 2020-04-28 NOTE — Telephone Encounter (Signed)
Appointment scheduled.

## 2020-04-28 NOTE — Telephone Encounter (Signed)
Needs appointment with me ASAP. I have openings tomorrow. This is a new problem.

## 2020-04-28 NOTE — Telephone Encounter (Signed)
Can you place referral?

## 2020-04-29 ENCOUNTER — Ambulatory Visit (INDEPENDENT_AMBULATORY_CARE_PROVIDER_SITE_OTHER): Payer: Medicare Other | Admitting: Family Medicine

## 2020-04-29 ENCOUNTER — Other Ambulatory Visit: Payer: Self-pay

## 2020-04-29 ENCOUNTER — Encounter: Payer: Self-pay | Admitting: Family Medicine

## 2020-04-29 VITALS — BP 128/86 | HR 89 | Temp 97.0°F | Ht 73.0 in | Wt 245.0 lb

## 2020-04-29 DIAGNOSIS — I4891 Unspecified atrial fibrillation: Secondary | ICD-10-CM | POA: Diagnosis not present

## 2020-04-29 DIAGNOSIS — I878 Other specified disorders of veins: Secondary | ICD-10-CM

## 2020-04-29 DIAGNOSIS — E782 Mixed hyperlipidemia: Secondary | ICD-10-CM

## 2020-04-29 DIAGNOSIS — I10 Essential (primary) hypertension: Secondary | ICD-10-CM | POA: Diagnosis not present

## 2020-04-29 MED ORDER — APIXABAN 5 MG PO TABS: 5.0000 mg | ORAL_TABLET | Freq: Two times a day (BID) | ORAL | 2 refills | Status: DC

## 2020-04-29 MED ORDER — FUROSEMIDE 20 MG PO TABS
20.0000 mg | ORAL_TABLET | Freq: Every day | ORAL | 2 refills | Status: DC | PRN
Start: 2020-04-29 — End: 2020-10-12

## 2020-04-29 MED ORDER — ROSUVASTATIN CALCIUM 5 MG PO TABS: ORAL_TABLET | ORAL | 1 refills | Status: DC

## 2020-04-29 NOTE — Patient Instructions (Signed)

## 2020-04-29 NOTE — Progress Notes (Signed)
Assessment & Plan:  1. New onset atrial fibrillation [facility]) Discussed atrial fibrillation. Started Eliquis 5 mg PO BID. Sample given from our office (LOT [id number], EXP JUL 2024) - 1 box with 14 tablets. Referral placed STAT.  CHA2DS2-VASc Score = 3  This indicates a 3.2% annual risk of stroke. The patient's score is based upon: CHF History: No HTN History: Yes Diabetes History: No Stroke History: No Vascular Disease History: No Age Score: 2 Gender Score: 0 - apixaban (ELIQUIS) 5 MG TABS tablet; Take 1 tablet (5 mg total) by mouth 2 (two) times daily.  Dispense: 60 tablet; Refill: 2 - Ambulatory referral to Cardiology  2. Essential hypertension Well controlled on current regimen. Elevated initially, improved upon re-check.   Follow up plan: Return in about 6 weeks (around 06/10/2020) for follow-up of chronic medication conditions.  Hendricks Limes, MSN, APRN, FNP-C [facility]  Subjective:   Patient ID: [name], male    DOB: January 21, 1930, 87 y.o.   MRN: [id number]  HPI: Godson Pollan is a 87 y.o. male presenting on 04/29/2020 for Referral (Cardiology due to EKG that he got done yesterday at [facility].)  Patient is accompanied by his son, Rodman Key, who he is okay with being present.  Patient had a pre-op EKG yesterday that showed A-Fib. He denies feeling any heart irregularities, chest pain, shortness of breath, or tachycardia. His hip replacement surgery is currently scheduled for next Wednesday.   Blood pressure elevated. He feels this was due to his walk from the parking lot and pain from his hip.   ROS: Negative unless specifically indicated above in HPI.   Relevant past medical history reviewed and updated as indicated.   Allergies and medications reviewed and updated.   Current Outpatient Medications:  .  fish oil-omega-3 fatty acids 1000 MG capsule, Take 2 g by mouth daily., Disp: , Rfl:  .  furosemide (LASIX) 20 MG tablet, TAKE  (1)  TABLET TWICE  A DAY. (Patient taking differently: Take 20 mg by mouth daily as needed for fluid.), Disp: 180 tablet, Rfl: 0 .  gabapentin (NEURONTIN) 300 MG capsule, Take 1 capsule (300 mg total) by mouth 2 (two) times daily., Disp: 60 capsule, Rfl: 2 .  lisinopril (ZESTRIL) 40 MG tablet, TAKE 1 TABLET DAILY (Patient taking differently: Take 40 mg by mouth daily.), Disp: 90 tablet, Rfl: 0 .  rosuvastatin (CRESTOR) 5 MG tablet, Take 0.5 tab on Monday, Wednesday and Friday evenings (Patient taking differently: Take 2.5 mg by mouth every Monday, Wednesday, and Friday.), Disp: 30 tablet, Rfl: 2  No Known Allergies  Objective:   BP 128/86   Pulse 89   Temp (!) 97 F (36.1 C) (Temporal)   Ht 6\' 1"  (1.854 m)   Wt 245 lb (111.1 kg)   SpO2 97%   BMI 32.32 kg/m    Physical Exam Vitals reviewed.  Constitutional:      General: He is not in acute distress.    Appearance: Normal appearance. He is obese. He is not ill-appearing, toxic-appearing or diaphoretic.  HENT:     Head: Normocephalic and atraumatic.  Eyes:     General: No scleral icterus.       Right eye: No discharge.        Left eye: No discharge.     Conjunctiva/sclera: Conjunctivae normal.  Cardiovascular:     Rate and Rhythm: Normal rate. Rhythm irregular.     Heart sounds: Normal heart sounds. No murmur heard. No friction rub. No gallop.  Pulmonary:     Effort: Pulmonary effort is normal. No respiratory distress.     Breath sounds: Normal breath sounds. No stridor. No wheezing, rhonchi or rales.  Musculoskeletal:        General: Normal range of motion.     Cervical back: Normal range of motion.  Skin:    General: Skin is warm and dry.  Neurological:     Mental Status: He is alert and oriented to person, place, and time. Mental status is at baseline.     Gait: Gait abnormal (ambulates with cane).  Psychiatric:        Mood and Affect: Mood normal.        Behavior: Behavior normal.        Thought Content: Thought content normal.         Judgment: Judgment normal.     CHA2DS2-VASc Score = 3  The patient's score is based upon: CHF History: No HTN History: Yes Diabetes History: No Stroke History: No Vascular Disease History: No Age Score: 2 Gender Score: 0

## 2020-04-30 ENCOUNTER — Other Ambulatory Visit (HOSPITAL_COMMUNITY)
Admission: RE | Admit: 2020-04-30 | Discharge: 2020-04-30 | Disposition: A | Discharge status: Home / Self Care | Payer: Medicare Other | Source: Ambulatory Visit | Attending: Orthopedic Surgery | Admitting: Orthopedic Surgery

## 2020-04-30 DIAGNOSIS — Z01812 Encounter for preprocedural laboratory examination: Secondary | ICD-10-CM | POA: Insufficient documentation

## 2020-04-30 DIAGNOSIS — Z20822 Contact with and (suspected) exposure to covid-19: Secondary | ICD-10-CM | POA: Insufficient documentation

## 2020-04-30 LAB — SARS CORONAVIRUS 2 (TAT 6-24 HRS): SARS Coronavirus 2: NEGATIVE

## 2020-05-04 ENCOUNTER — Encounter (HOSPITAL_COMMUNITY): Admission: RE | Payer: Self-pay | Source: Home / Self Care

## 2020-05-04 ENCOUNTER — Encounter: Payer: Self-pay | Admitting: Cardiovascular Disease

## 2020-05-04 ENCOUNTER — Ambulatory Visit (HOSPITAL_COMMUNITY): Admission: RE | Admit: 2020-05-04 | Payer: Medicare Other | Source: Home / Self Care | Admitting: Orthopedic Surgery

## 2020-05-04 ENCOUNTER — Ambulatory Visit: Payer: Medicare Other | Admitting: Cardiovascular Disease

## 2020-05-04 ENCOUNTER — Other Ambulatory Visit: Payer: Self-pay

## 2020-05-04 VITALS — BP 132/80 | HR 65 | Ht 73.0 in | Wt 246.0 lb

## 2020-05-04 DIAGNOSIS — E782 Mixed hyperlipidemia: Secondary | ICD-10-CM | POA: Diagnosis not present

## 2020-05-04 DIAGNOSIS — I482 Chronic atrial fibrillation, unspecified: Secondary | ICD-10-CM | POA: Diagnosis not present

## 2020-05-04 DIAGNOSIS — Z01818 Encounter for other preprocedural examination: Secondary | ICD-10-CM | POA: Diagnosis not present

## 2020-05-04 DIAGNOSIS — I10 Essential (primary) hypertension: Secondary | ICD-10-CM

## 2020-05-04 SURGERY — ARTHROPLASTY, HIP, TOTAL, ANTERIOR APPROACH
Anesthesia: Choice | Site: Hip | Laterality: Right

## 2020-05-04 NOTE — Assessment & Plan Note (Signed)
[name] was referred by his PCP for preoperative clearance before elective right total hip replacement.  He did have left total hip replacement 15 years ago by [name].  He was found to be in A. fib of unclear duration.  He is completely asymptomatic.  His only other risk factor is hypertension adequately treated.  Is never had a heart attack or stroke.  He denies chest pain or shortness of breath.  I am clearing him at low risk without the need for functional study.  He will need to stop his Eliquis 2 days prior to the procedure and restart it when [name] feels clinically safe.

## 2020-05-04 NOTE — Assessment & Plan Note (Signed)
History of essential hypertension a blood pressure measured today 132/80.  He is on lisinopril.

## 2020-05-04 NOTE — Progress Notes (Signed)
05/04/2020 [name]   1929/07/27  [id number]  Primary Physician Tyler Brooklyn, FNP Primary Cardiologist: Tyler Harp MD [name], [place]  HPI:  [name] is a 87 y.o. moderately overweight divorced Caucasian male father of 3 children, grandfather 2 grandchildren who is accompanied by one of his sons [name] today.  He was referred by Tyler Ovens, FNP for preoperative clearance before elective right total hip replacement.  He did have a left total hip replacement successfully performed 15 years ago by the same surgeon, Dr. Wynelle Terry.  He continues to work and is in the [place].  His risk factors include treated hypertension and hyperlipidemia.  His father did have a myocardial infarction at age 32.  He is never had a heart attack or stroke.  He denies chest pain or shortness of breath.  His last EKG in our system was 07/18/2017 revealing normal sinus rhythm.  His recent EKG showed A. fib with a controlled ventricular response.  His PCP began him on Eliquis oral anticoagulation for stroke prophylaxis.   Current Meds  Medication Sig  . apixaban (ELIQUIS) 5 MG TABS tablet Take 1 tablet (5 mg total) by mouth 2 (two) times daily.  . fish oil-omega-3 fatty acids 1000 MG capsule Take 2 g by mouth daily.  . furosemide (LASIX) 20 MG tablet Take 1 tablet (20 mg total) by mouth daily as needed for fluid.  Marland Kitchen lisinopril (ZESTRIL) 40 MG tablet TAKE 1 TABLET DAILY (Patient taking differently: Take 40 mg by mouth daily.)  . rosuvastatin (CRESTOR) 5 MG tablet Take 0.5 tab on Monday, Wednesday and Friday evenings     No Known Allergies  Social History   Socioeconomic History  . Marital status: Divorced    Spouse name: Not on file  . Number of children: 3  . Years of education: 52  . Highest education level: 11th grade  Occupational History  . Not on file  Tobacco Use  . Smoking status: Never Smoker  . Smokeless tobacco: Never Used  Vaping Use  . Vaping  Use: Never used  Substance and Sexual Activity  . Alcohol use: No  . Drug use: No  . Sexual activity: Not on file  Other Topics Concern  . Not on file  Social History Narrative  . Not on file   Social Determinants of Health   Financial Resource Strain: Not on file  Food Insecurity: Not on file  Transportation Needs: Not on file  Physical Activity: Not on file  Stress: Not on file  Social Connections: Not on file  Intimate Partner Violence: Not on file     Review of Systems: General: negative for chills, fever, night sweats or weight changes.  Cardiovascular: negative for chest pain, dyspnea on exertion, edema, orthopnea, palpitations, paroxysmal nocturnal dyspnea or shortness of breath Dermatological: negative for rash Respiratory: negative for cough or wheezing Urologic: negative for hematuria Abdominal: negative for nausea, vomiting, diarrhea, bright red blood per rectum, melena, or hematemesis Neurologic: negative for visual changes, syncope, or dizziness All other systems reviewed and are otherwise negative except as noted above.    Blood pressure 132/80, pulse 65, height 6\' 1"  (1.854 m), weight 246 lb (111.6 kg).  General appearance: alert and no distress Neck: no adenopathy, no carotid bruit, no JVD, supple, symmetrical, trachea midline and thyroid not enlarged, symmetric, no tenderness/mass/nodules Lungs: clear to auscultation bilaterally Heart: irregularly irregular rhythm Extremities: extremities normal, atraumatic, no cyanosis or edema Pulses: 2+ and  symmetric Skin: Skin color, texture, turgor normal. No rashes or lesions Neurologic: Alert and oriented X 3, normal strength and tone. Normal symmetric reflexes. Normal coordination and gait  EKG atrial fibrillation with ventricular sponsor 65 and occasional aberrantly conducted beats with septal Q waves and low QRS voltage.  I personally reviewed this EKG.  ASSESSMENT AND PLAN:   Essential hypertension History  of essential hypertension a blood pressure measured today 132/80.  He is on lisinopril.  Hyperlipemia History of hyperlipidemia on low-dose rosuvastatin recently started by his PCP.  His most recent lipid profile performed 09/16/2019 revealed total cholesterol of 184, LDL of 103 and HDL of 65.  He is at goal for primary prevention.  Atrial fibrillation ([place]) Persistent atrial fibrillation found last week for preoperative evaluation by anesthesia.  His last EKG in our system was performed 07/18/2017 when he was in sinus rhythm.  He is completely asymptomatic.  He is currently in A. fib.  He was begun on Eliquis oral anticoagulation.The CHA2DSVASC2 score is  3 (hypertension and age).  Preoperative clearance [name] was referred by his PCP for preoperative clearance before elective right total hip replacement.  He did have left total hip replacement 15 years ago by Dr. Ricki Terry.  He was found to be in A. fib of unclear duration.  He is completely asymptomatic.  His only other risk factor is hypertension adequately treated.  Is never had a heart attack or stroke.  He denies chest pain or shortness of breath.  I am clearing him at low risk without the need for functional study.  He will need to stop his Eliquis 2 days prior to the procedure and restart it when Dr. Ricki Terry feels clinically safe.      Tyler Harp MD FACP,FACC,FAHA, [facility] 05/04/2020 8:59 AM

## 2020-05-04 NOTE — Assessment & Plan Note (Signed)
Persistent atrial fibrillation found last week for preoperative evaluation by anesthesia.  His last EKG in our system was performed 07/18/2017 when he was in sinus rhythm.  He is completely asymptomatic.  He is currently in A. fib.  He was begun on Eliquis oral anticoagulation.The CHA2DSVASC2 score is  3 (hypertension and age).

## 2020-05-04 NOTE — Patient Instructions (Signed)

## 2020-05-04 NOTE — Assessment & Plan Note (Signed)
History of hyperlipidemia on low-dose rosuvastatin recently started by his PCP.  His most recent lipid profile performed 09/16/2019 revealed total cholesterol of 184, LDL of 103 and HDL of 65.  He is at goal for primary prevention.

## 2020-05-05 NOTE — Progress Notes (Signed)
Anesthesia Chart Review   Case: [id number] Date/Time: 05/11/20 1515   Procedure: TOTAL HIP ARTHROPLASTY ANTERIOR APPROACH (Right Hip) - 142min   Anesthesia type: Choice   Pre-op diagnosis: right hip osteoarthritis   Location: [facility] ROOM 10 / [facility]   Surgeons: [name], MD      DISCUSSION:87 y.o. never smoker with h/o HTN, right hip OA scheduled for above procedure 05/11/20 with Dr. [name].   Pt previously scheduled for above procedure 05/04/2020. Postponed due to new onset A-fib found during PAT visit.  He was started on Eliquis by PCP and seen by cardiology 05/04/2020 for evaluation.  Per OV note, "Mr. Earnhardt was referred by his PCP for preoperative clearance before elective right total hip replacement.  He did have left total hip replacement 15 years ago by [name].  He was found to be in A. fib of unclear duration.  He is completely asymptomatic.  His only other risk factor is hypertension adequately treated.  Is never had a heart attack or stroke.  He denies chest pain or shortness of breath.  I am clearing him at low risk without the need for functional study.  He will need to stop his Eliquis 2 days prior to the procedure and restart it when [name] feels clinically safe."  Anticipate pt can proceed with planned procedure barring acute status change.   VS: BP (!) 156/97   Pulse 77   Temp 36.8 C (Oral)   Resp 16   Ht 6' (1.829 m)   Wt 111.6 kg   SpO2 100%   BMI 33.36 kg/m   PROVIDERS: [name], FNP is PCP   Quay Burow, MD is Cardiologist  LABS: labs DOS (all labs ordered are listed, but only abnormal results are displayed)  Labs Reviewed  SURGICAL PCR SCREEN     IMAGES:   EKG: 05/04/2020 Rate 65  Atrial fibrillation with premature ventricular or aberrantly conducted complexes Low voltage QRS  CV:  Past Medical History:  Diagnosis Date  . Arthritis   . Atrial fibrillation ([place]) 04/28/2020  . Hyperlipidemia   . Hypertension   . Skin  cancer    Face    Past Surgical History:  Procedure Laterality Date  . APPENDECTOMY    . JOINT REPLACEMENT Left    hip   . SKIN LESION EXCISION     Face    MEDICATIONS: . apixaban (ELIQUIS) 5 MG TABS tablet  . fish oil-omega-3 fatty acids 1000 MG capsule  . furosemide (LASIX) 20 MG tablet  . lisinopril (ZESTRIL) 40 MG tablet  . rosuvastatin (CRESTOR) 5 MG tablet   No current facility-administered medications for this encounter.    Konrad Felix, PA-C [facility] [phone]

## 2020-05-05 NOTE — Progress Notes (Signed)
COVID Vaccine Completed: x1 Date COVID Vaccine completed: 03-15-20 Has received booster: COVID vaccine manufacturer:     Moderna      Date of COVID positive in last 90 days:  N/A  PCP - Hendricks Limes, FNP Cardiologist -Dr. Adora Fridge cardiac clearance in epic 05/05/19 Medical clearance on chart dated 04-26-20 by Hendricks Limes, FNP  Chest x-ray - N/A EKG - 04-27-20 Epic Stress Test - N/A ECHO - N/A Cardiac Cath - N/A Pacemaker/ICD device last checked:N/A Spinal Cord Stimulator:N/A  Sleep Study - N/A CPAP -   Fasting Blood Sugar - N/A Checks Blood Sugar _____ times a day  Blood Thinner Instructions:  Eliquis last dose on Sunday  Aspirin Instructions: N/A Last Dose: N/A  Activity level:             Can go up a flight of stairs and perform activities of daily living without stopping and without symptoms of chest pain or shortness of breath.  Pt lives alone and has to climb stairs at home.                           Anesthesia review: Afib on EKG at PAT appt., HTN  Patient denies shortness of breath, fever, cough and chest pain at PAT appointment   Patient verbalized understanding of instructions that were given to them at the PAT appointment. Patient was also instructed that they will need to review over the PAT instructions again at home before surgery.

## 2020-05-05 NOTE — Anesthesia Preprocedure Evaluation (Addendum)
Anesthesia Evaluation  Patient identified by MRN, date of birth, ID band Patient awake    Reviewed: Allergy & Precautions, NPO status , Patient's Chart, lab work & pertinent test results  History of Anesthesia Complications Negative for: history of anesthetic complications  Airway Mallampati: II  TM Distance: >3 FB Neck ROM: Full    Dental  (+) Upper Dentures, Edentulous Lower   Pulmonary neg pulmonary ROS,    Pulmonary exam normal        Cardiovascular hypertension, Pt. on medications + dysrhythmias Atrial Fibrillation  Rhythm:Irregular Rate:Normal     Neuro/Psych negative neurological ROS  negative psych ROS   GI/Hepatic negative GI ROS, Neg liver ROS,   Endo/Other   Obesity   Renal/GU negative Renal ROS     Musculoskeletal  (+) Arthritis ,   Abdominal   Peds  Hematology  On eliquis    Anesthesia Other Findings Covid test negative See PAT note  Reproductive/Obstetrics                          Anesthesia Physical Anesthesia Plan  ASA: III  Anesthesia Plan: General   Post-op Pain Management:    Induction: Intravenous  PONV Risk Score and Plan: 2 and Treatment may vary due to age or medical condition, Ondansetron and Propofol infusion  Airway Management Planned: Oral ETT  Additional Equipment: None  Intra-op Plan:   Post-operative Plan: Extubation in OR  Informed Consent: I have reviewed the patients History and Physical, chart, labs and discussed the procedure including the risks, benefits and alternatives for the proposed anesthesia with the patient or authorized representative who has indicated his/her understanding and acceptance.     Dental advisory given  Plan Discussed with: CRNA and Anesthesiologist  Anesthesia Plan Comments:       Anesthesia Quick Evaluation

## 2020-05-09 ENCOUNTER — Other Ambulatory Visit (HOSPITAL_COMMUNITY)
Admission: RE | Admit: 2020-05-09 | Discharge: 2020-05-09 | Disposition: A | Discharge status: Home / Self Care | Payer: Medicare Other | Source: Ambulatory Visit | Attending: Orthopedic Surgery | Admitting: Orthopedic Surgery

## 2020-05-09 DIAGNOSIS — Z01812 Encounter for preprocedural laboratory examination: Secondary | ICD-10-CM | POA: Diagnosis not present

## 2020-05-09 DIAGNOSIS — Z20822 Contact with and (suspected) exposure to covid-19: Secondary | ICD-10-CM | POA: Diagnosis not present

## 2020-05-09 LAB — SARS CORONAVIRUS 2 (TAT 6-24 HRS): SARS Coronavirus 2: NEGATIVE

## 2020-05-10 NOTE — H&P (Signed)
TOTAL HIP ADMISSION H&P  Patient is admitted for right total hip arthroplasty.  Subjective:  Chief Complaint: Right hip pain  HPI: [name], 87 y.o. male, has a history of pain and functional disability in the right hip due to arthritis and patient has failed non-surgical conservative treatments for greater than 12 weeks to include NSAID's and/or analgesics and activity modification. Onset of symptoms was gradual, starting >10 years ago with gradually worsening course since that time. The patient noted no past surgery on the right hip. Patient currently rates pain in the right hip at 5 out of 10 with activity. Patient has worsening of pain with activity and weight bearing and pain that interfers with activities of daily living. Patient has evidence of periarticular osteophytes and joint space narrowing by imaging studies. This condition presents safety issues increasing the risk of falls. There is no current active infection.  Patient Active Problem List   Diagnosis Date Noted  . Preoperative clearance 05/04/2020  . Atrial fibrillation ([place]) 04/28/2020  . Venous stasis of lower extremity 10/23/2019  . Obesity (BMI 30.0-34.9) 07/18/2017  . Essential hypertension 08/16/2014  . Hyperlipemia 08/16/2014  . Peripheral edema 06/10/2012  . Vitamin D deficiency 05/10/2010  . Arthritis 05/10/2010    Past Medical History:  Diagnosis Date  . Arthritis   . Atrial fibrillation ([place]) 04/28/2020  . Hyperlipidemia   . Hypertension   . Skin cancer    Face    Past Surgical History:  Procedure Laterality Date  . APPENDECTOMY    . JOINT REPLACEMENT Left    hip   . SKIN LESION EXCISION     Face    Prior to Admission medications   Medication Sig Start Date End Date Taking? Authorizing Provider  apixaban (ELIQUIS) 5 MG TABS tablet Take 1 tablet (5 mg total) by mouth 2 (two) times daily. 04/29/20  Yes Hendricks Limes F, FNP  fish oil-omega-3 fatty acids 1000 MG capsule Take 2 g by mouth  daily.   Yes [provider]  furosemide (LASIX) 20 MG tablet Take 1 tablet (20 mg total) by mouth daily as needed for fluid. 04/29/20  Yes Hendricks Limes F, FNP  lisinopril (ZESTRIL) 40 MG tablet TAKE 1 TABLET DAILY Patient taking differently: Take 40 mg by mouth daily. 03/11/20  Yes Hendricks Limes F, FNP  rosuvastatin (CRESTOR) 5 MG tablet Take 0.5 tab on Monday, Wednesday and Friday evenings Patient taking differently: Take 2.5 mg by mouth See admin instructions. Take on  Monday, Wednesday and Friday evenings 04/29/20  Yes [name], FNP    No Known Allergies  Social History   Socioeconomic History  . Marital status: Divorced    Spouse name: Not on file  . Number of children: 3  . Years of education: 73  . Highest education level: 11th grade  Occupational History  . Not on file  Tobacco Use  . Smoking status: Never Smoker  . Smokeless tobacco: Never Used  Vaping Use  . Vaping Use: Never used  Substance and Sexual Activity  . Alcohol use: No  . Drug use: No  . Sexual activity: Not on file  Other Topics Concern  . Not on file  Social History Narrative  . Not on file   Social Determinants of Health   Financial Resource Strain: Not on file  Food Insecurity: Not on file  Transportation Needs: Not on file  Physical Activity: Not on file  Stress: Not on file  Social Connections: Not on file  Intimate Partner Violence: Not on file    Tobacco Use: Low Risk   . Smoking Tobacco Use: Never Smoker  . Smokeless Tobacco Use: Never Used   Social History   Substance and Sexual Activity  Alcohol Use No    Family History  Problem Relation Age of Onset  . Cancer Mother   . Stroke Father   . Dementia Brother   . Healthy Daughter   . Healthy Son   . Healthy Son     ROS:  Constitutional Constitutional: no fever, no chills, no night sweats, no significant weight loss  Cardiovascular Cardiovascular: no chest pain, no  palpitations  Respiratory Respiratory: no cough, no shortness of breath, No COPD  Gastrointestinal Gastrointestinal: no vomiting, no nausea  Musculoskeletal Musculoskeletal: no swelling in Joints, Joint Pain  Neurologic Neurologic: no numbness, no tingling, no difficulty with balance   Objective:  Physical Exam: Well nourished and well developed.  General: Alert and oriented x3, cooperative and pleasant, no acute distress.  Head: normocephalic, atraumatic, neck supple.  Eyes: EOMI.  Respiratory: breath sounds clear in all fields, no wheezing, rales, or rhonchi. Cardiovascular: Regular rate and rhythm, no murmurs, gallops or rubs.  Abdomen: non-tender to palpation and soft, normoactive bowel sounds. Musculoskeletal:  The patient is sitting in a wheelchair today.    Right Hip Exam:  Range of motion: Flexion to 90 degrees, internal rotation to 0 degrees, external rotation to 10 degrees, and abduction to 10 to 20 degrees without discomfort.  There is no tenderness over the greater trochanteric bursa.    Left Hip Exam:  Range of motion: Flexion to 110 degrees, internal rotation to 20 degrees, external rotation to 30 degrees, and abduction to 30 degrees without discomfort.  There is no tenderness over the greater trochanteric bursa    The patient's sensation and motor function are intact in their lower extremities. Their distal pulses are 2+. The bilateral calves are soft and non-tender.        Vital signs in last 24 hours:    Imaging Review AP pelvis, AP and lateral of the right hip dated 03/25/2020 demonstrate severe end-stage bone-on-bone arthritis of the right hip with subchondral cystic formation. The left hip prosthesis is in excellent position with no periprosthetic abnormalities.   Assessment/Plan:  End stage arthritis, right hip  The patient history, physical examination, clinical judgement of the provider and imaging studies are consistent with end stage  degenerative joint disease of the right hip and total hip arthroplasty is deemed medically necessary. The treatment options including medical management, injection therapy, arthroscopy and arthroplasty were discussed at length. The risks and benefits of total hip arthroplasty were presented and reviewed. The risks due to aseptic loosening, infection, stiffness, dislocation/subluxation, thromboembolic complications and other imponderables were discussed. The patient acknowledged the explanation, agreed to proceed with the plan and consent was signed. Patient is being admitted for inpatient treatment for surgery, pain control, PT, OT, prophylactic antibiotics, VTE prophylaxis, progressive ambulation and ADLs and discharge planning.The patient is planning to be discharged home with home health services.   Patient's anticipated LOS is less than 2 midnights, meeting these requirements: - Lives within 1 hour of care - Has a competent adult at home to recover with post-op recover - NO history of  - Chronic pain requiring opiods  - Diabetes  - Coronary Artery Disease  - Heart failure  - Heart attack  - Stroke  - DVT/VTE  - Cardiac arrhythmia  - Respiratory Failure/COPD  - Renal  failure  - Anemia  - Advanced Liver disease  Therapy Plans: HEP Disposition: Rodman Key (son) Planned DVT Prophylaxis: Aspirin 325 mg DME Needed: None PCP: [facility] (Clearance not received) TXA: IV Allergies: None Anesthesia Concerns: None BMI: 32.8 Last HgbA1c: N/A  - Patient was instructed on what medications to stop prior to surgery. - Follow-up visit in 2 weeks with Dr. Wynelle Link - Begin physical therapy following surgery - Pre-operative lab work as pre-surgical testing - Prescriptions will be provided in hospital at time of discharge  Fenton Foy, [facility], PA-C Orthopedic Surgery [facility] [place]

## 2020-05-11 ENCOUNTER — Other Ambulatory Visit: Payer: Self-pay

## 2020-05-11 ENCOUNTER — Ambulatory Visit (HOSPITAL_COMMUNITY): Payer: Medicare Other

## 2020-05-11 ENCOUNTER — Encounter (HOSPITAL_COMMUNITY)
Admission: RE | Disposition: A | Discharge status: Home / Self Care | Payer: Self-pay | Source: Home / Self Care | Attending: Orthopedic Surgery

## 2020-05-11 ENCOUNTER — Observation Stay (HOSPITAL_COMMUNITY): Payer: Medicare Other

## 2020-05-11 ENCOUNTER — Encounter (HOSPITAL_COMMUNITY): Payer: Self-pay | Admitting: Orthopedic Surgery

## 2020-05-11 ENCOUNTER — Ambulatory Visit (HOSPITAL_COMMUNITY): Payer: Medicare Other | Admitting: Physician Assistant

## 2020-05-11 ENCOUNTER — Observation Stay (HOSPITAL_COMMUNITY)
Admission: RE | Admit: 2020-05-11 | Discharge: 2020-05-17 | Disposition: A | Discharge status: Home / Self Care | Payer: Medicare Other | Attending: Orthopedic Surgery | Admitting: Orthopedic Surgery

## 2020-05-11 DIAGNOSIS — Z7901 Long term (current) use of anticoagulants: Secondary | ICD-10-CM | POA: Diagnosis not present

## 2020-05-11 DIAGNOSIS — I10 Essential (primary) hypertension: Secondary | ICD-10-CM | POA: Diagnosis not present

## 2020-05-11 DIAGNOSIS — Z471 Aftercare following joint replacement surgery: Secondary | ICD-10-CM | POA: Diagnosis not present

## 2020-05-11 DIAGNOSIS — I4891 Unspecified atrial fibrillation: Secondary | ICD-10-CM | POA: Insufficient documentation

## 2020-05-11 DIAGNOSIS — E559 Vitamin D deficiency, unspecified: Secondary | ICD-10-CM | POA: Diagnosis not present

## 2020-05-11 DIAGNOSIS — M1611 Unilateral primary osteoarthritis, right hip: Secondary | ICD-10-CM | POA: Diagnosis not present

## 2020-05-11 DIAGNOSIS — Z96649 Presence of unspecified artificial hip joint: Secondary | ICD-10-CM

## 2020-05-11 DIAGNOSIS — Z20822 Contact with and (suspected) exposure to covid-19: Secondary | ICD-10-CM | POA: Diagnosis not present

## 2020-05-11 DIAGNOSIS — Z79899 Other long term (current) drug therapy: Secondary | ICD-10-CM | POA: Insufficient documentation

## 2020-05-11 DIAGNOSIS — E785 Hyperlipidemia, unspecified: Secondary | ICD-10-CM | POA: Diagnosis not present

## 2020-05-11 DIAGNOSIS — Z85828 Personal history of other malignant neoplasm of skin: Secondary | ICD-10-CM | POA: Diagnosis not present

## 2020-05-11 DIAGNOSIS — Z419 Encounter for procedure for purposes other than remedying health state, unspecified: Secondary | ICD-10-CM

## 2020-05-11 DIAGNOSIS — M169 Osteoarthritis of hip, unspecified: Secondary | ICD-10-CM | POA: Diagnosis present

## 2020-05-11 DIAGNOSIS — Z96641 Presence of right artificial hip joint: Secondary | ICD-10-CM | POA: Diagnosis not present

## 2020-05-11 HISTORY — PX: TOTAL HIP ARTHROPLASTY: SHX124

## 2020-05-11 LAB — CBC
HCT: 48.8 % (ref 39.0–52.0)
Hemoglobin: 16.3 g/dL (ref 13.0–17.0)
MCH: 32.2 pg (ref 26.0–34.0)
MCHC: 33.4 g/dL (ref 30.0–36.0)
MCV: 96.4 fL (ref 80.0–100.0)
Platelets: 215 10*3/uL (ref 150–400)
RBC: 5.06 MIL/uL (ref 4.22–5.81)
RDW: 12.5 % (ref 11.5–15.5)
WBC: 7.3 10*3/uL (ref 4.0–10.5)
nRBC: 0 % (ref 0.0–0.2)

## 2020-05-11 LAB — TYPE AND SCREEN
ABO/RH(D): O NEG
Antibody Screen: NEGATIVE

## 2020-05-11 LAB — COMPREHENSIVE METABOLIC PANEL
ALT: 13 U/L (ref 0–44)
AST: 20 U/L (ref 15–41)
Albumin: 4.1 g/dL (ref 3.5–5.0)
Alkaline Phosphatase: 65 U/L (ref 38–126)
Anion gap: 8 (ref 5–15)
BUN: 14 mg/dL (ref 8–23)
CO2: 26 mmol/L (ref 22–32)
Calcium: 9.1 mg/dL (ref 8.9–10.3)
Chloride: 101 mmol/L (ref 98–111)
Creatinine, Ser: 1.03 mg/dL (ref 0.61–1.24)
GFR, Estimated: >60 mL/min (ref >60)
Glucose, Bld: 128 mg/dL — ABNORMAL HIGH (ref 70–99)
Potassium: 3.7 mmol/L (ref 3.5–5.1)
Sodium: 135 mmol/L (ref 135–145)
Total Bilirubin: 1.3 mg/dL — ABNORMAL HIGH (ref 0.3–1.2)
Total Protein: 7.1 g/dL (ref 6.5–8.1)

## 2020-05-11 LAB — PROTIME-INR
INR: 1.1 (ref 0.8–1.2)
Prothrombin Time: 13.6 seconds (ref 11.4–15.2)

## 2020-05-11 LAB — APTT: aPTT: 33 seconds (ref 24–36)

## 2020-05-11 LAB — ABO/RH: ABO/RH(D): O NEG

## 2020-05-11 SURGERY — ARTHROPLASTY, HIP, TOTAL, ANTERIOR APPROACH
Anesthesia: General | Site: Hip | Laterality: Right

## 2020-05-11 MED ORDER — SUGAMMADEX SODIUM 200 MG/2ML IV SOLN
INTRAVENOUS | Status: DC | PRN
  Administered 2020-05-11: 200 mg via INTRAVENOUS

## 2020-05-11 MED ORDER — OXYCODONE HCL 5 MG PO TABS: 5.0000 mg | ORAL_TABLET | Freq: Once | ORAL | Status: DC | PRN

## 2020-05-11 MED ORDER — METOCLOPRAMIDE HCL 5 MG/ML IJ SOLN: 5.0000 mg | Freq: Three times a day (TID) | INTRAMUSCULAR | Status: DC | PRN

## 2020-05-11 MED ORDER — PROPOFOL 10 MG/ML IV BOLUS
INTRAVENOUS | Status: DC | PRN
  Administered 2020-05-11: 20 mg via INTRAVENOUS
  Administered 2020-05-11: 30 mg via INTRAVENOUS
  Administered 2020-05-11: 110 mg via INTRAVENOUS
  Administered 2020-05-11: 40 mg via INTRAVENOUS

## 2020-05-11 MED ORDER — METHOCARBAMOL 500 MG PO TABS
500.0000 mg | ORAL_TABLET | Freq: Four times a day (QID) | ORAL | Status: DC | PRN
  Administered 2020-05-11 – 2020-05-16 (×7): 500 mg via ORAL
  Filled 2020-05-11 (×7): qty 1

## 2020-05-11 MED ORDER — DOCUSATE SODIUM 100 MG PO CAPS
100.0000 mg | ORAL_CAPSULE | Freq: Two times a day (BID) | ORAL | Status: DC
  Administered 2020-05-11 – 2020-05-17 (×10): 100 mg via ORAL
  Filled 2020-05-11 (×10): qty 1

## 2020-05-11 MED ORDER — MAGNESIUM CITRATE PO SOLN
1.0000 | Freq: Once | ORAL | Status: AC | PRN
  Administered 2020-05-17: 1 via ORAL
  Filled 2020-05-11: qty 296

## 2020-05-11 MED ORDER — DEXAMETHASONE SODIUM PHOSPHATE 10 MG/ML IJ SOLN
8.0000 mg | Freq: Once | INTRAMUSCULAR | Status: AC
  Administered 2020-05-11: 8 mg via INTRAVENOUS

## 2020-05-11 MED ORDER — MORPHINE SULFATE (PF) 2 MG/ML IV SOLN
0.5000 mg | INTRAVENOUS | Status: DC | PRN
Start: 2020-05-11 — End: 2020-05-17

## 2020-05-11 MED ORDER — DEXAMETHASONE SODIUM PHOSPHATE 10 MG/ML IJ SOLN
INTRAMUSCULAR | Status: AC
  Filled 2020-05-11: qty 1

## 2020-05-11 MED ORDER — BISACODYL 10 MG RE SUPP: 10.0000 mg | Freq: Every day | RECTAL | Status: DC | PRN

## 2020-05-11 MED ORDER — PROPOFOL 500 MG/50ML IV EMUL
INTRAVENOUS | Status: AC
  Filled 2020-05-11: qty 50

## 2020-05-11 MED ORDER — LACTATED RINGERS IV SOLN: INTRAVENOUS | Status: DC

## 2020-05-11 MED ORDER — ROCURONIUM BROMIDE 10 MG/ML (PF) SYRINGE
PREFILLED_SYRINGE | INTRAVENOUS | Status: AC
  Filled 2020-05-11: qty 10

## 2020-05-11 MED ORDER — WATER FOR IRRIGATION, STERILE IR SOLN
Status: DC | PRN
  Administered 2020-05-11: 2000 mL

## 2020-05-11 MED ORDER — OXYCODONE HCL 5 MG/5ML PO SOLN: 5.0000 mg | Freq: Once | ORAL | Status: DC | PRN

## 2020-05-11 MED ORDER — BUPIVACAINE-EPINEPHRINE 0.25% -1:200000 IJ SOLN
INTRAMUSCULAR | Status: AC
  Filled 2020-05-11: qty 1

## 2020-05-11 MED ORDER — PROPOFOL 10 MG/ML IV BOLUS
INTRAVENOUS | Status: AC
  Filled 2020-05-11: qty 20

## 2020-05-11 MED ORDER — METHOCARBAMOL 500 MG IVPB - SIMPLE MED
500.0000 mg | Freq: Four times a day (QID) | INTRAVENOUS | Status: DC | PRN
  Filled 2020-05-11: qty 50

## 2020-05-11 MED ORDER — CHLORHEXIDINE GLUCONATE 0.12 % MT SOLN
15.0000 mL | Freq: Once | OROMUCOSAL | Status: AC
  Administered 2020-05-11: 15 mL via OROMUCOSAL

## 2020-05-11 MED ORDER — FENTANYL CITRATE (PF) 100 MCG/2ML IJ SOLN
25.0000 ug | INTRAMUSCULAR | Status: DC | PRN
  Administered 2020-05-11: 50 ug via INTRAVENOUS

## 2020-05-11 MED ORDER — TRANEXAMIC ACID-NACL 1000-0.7 MG/100ML-% IV SOLN
1000.0000 mg | INTRAVENOUS | Status: AC
  Administered 2020-05-11: 1000 mg via INTRAVENOUS
  Filled 2020-05-11: qty 100

## 2020-05-11 MED ORDER — TRAMADOL HCL 50 MG PO TABS
50.0000 mg | ORAL_TABLET | Freq: Four times a day (QID) | ORAL | Status: DC | PRN
Start: 2020-05-11 — End: 2020-05-17
  Administered 2020-05-15 – 2020-05-16 (×3): 50 mg via ORAL
  Filled 2020-05-11 (×3): qty 1

## 2020-05-11 MED ORDER — CEFAZOLIN SODIUM-DEXTROSE 2-4 GM/100ML-% IV SOLN
2.0000 g | INTRAVENOUS | Status: AC
  Administered 2020-05-11: 2 g via INTRAVENOUS
  Filled 2020-05-11: qty 100

## 2020-05-11 MED ORDER — PHENYLEPHRINE HCL (PRESSORS) 10 MG/ML IV SOLN
INTRAVENOUS | Status: DC | PRN
  Administered 2020-05-11: 40 ug via INTRAVENOUS
  Administered 2020-05-11 (×2): 120 ug via INTRAVENOUS

## 2020-05-11 MED ORDER — LIDOCAINE HCL (CARDIAC) PF 100 MG/5ML IV SOSY
PREFILLED_SYRINGE | INTRAVENOUS | Status: DC | PRN
  Administered 2020-05-11: 50 mg via INTRAVENOUS

## 2020-05-11 MED ORDER — BUPIVACAINE-EPINEPHRINE (PF) 0.25% -1:200000 IJ SOLN
INTRAMUSCULAR | Status: DC | PRN
  Administered 2020-05-11: 30 mL

## 2020-05-11 MED ORDER — FENTANYL CITRATE (PF) 100 MCG/2ML IJ SOLN
INTRAMUSCULAR | Status: DC | PRN
  Administered 2020-05-11 (×2): 25 ug via INTRAVENOUS
  Administered 2020-05-11 (×3): 50 ug via INTRAVENOUS

## 2020-05-11 MED ORDER — ROCURONIUM BROMIDE 100 MG/10ML IV SOLN
INTRAVENOUS | Status: DC | PRN
  Administered 2020-05-11: 20 mg via INTRAVENOUS
  Administered 2020-05-11: 60 mg via INTRAVENOUS

## 2020-05-11 MED ORDER — FUROSEMIDE 20 MG PO TABS: 20.0000 mg | ORAL_TABLET | Freq: Every day | ORAL | Status: DC | PRN

## 2020-05-11 MED ORDER — FENTANYL CITRATE (PF) 100 MCG/2ML IJ SOLN
INTRAMUSCULAR | Status: AC
  Filled 2020-05-11: qty 2

## 2020-05-11 MED ORDER — DEXAMETHASONE SODIUM PHOSPHATE 10 MG/ML IJ SOLN
10.0000 mg | Freq: Once | INTRAMUSCULAR | Status: AC
  Administered 2020-05-12: 10 mg via INTRAVENOUS
  Filled 2020-05-11: qty 1

## 2020-05-11 MED ORDER — APIXABAN 2.5 MG PO TABS
2.5000 mg | ORAL_TABLET | Freq: Two times a day (BID) | ORAL | Status: DC
  Administered 2020-05-12 – 2020-05-17 (×11): 2.5 mg via ORAL
  Filled 2020-05-11 (×11): qty 1

## 2020-05-11 MED ORDER — ORAL CARE MOUTH RINSE: 15.0000 mL | Freq: Once | OROMUCOSAL | Status: AC

## 2020-05-11 MED ORDER — ACETAMINOPHEN 10 MG/ML IV SOLN
1000.0000 mg | Freq: Four times a day (QID) | INTRAVENOUS | Status: DC
  Administered 2020-05-11: 1000 mg via INTRAVENOUS
  Filled 2020-05-11: qty 100

## 2020-05-11 MED ORDER — SODIUM CHLORIDE 0.9 % IV SOLN: INTRAVENOUS | Status: DC

## 2020-05-11 MED ORDER — ACETAMINOPHEN 325 MG PO TABS: 325.0000 mg | ORAL_TABLET | Freq: Four times a day (QID) | ORAL | Status: DC | PRN

## 2020-05-11 MED ORDER — CEFAZOLIN SODIUM-DEXTROSE 2-4 GM/100ML-% IV SOLN
2.0000 g | Freq: Four times a day (QID) | INTRAVENOUS | Status: AC
  Administered 2020-05-11 – 2020-05-12 (×2): 2 g via INTRAVENOUS
  Filled 2020-05-11 (×2): qty 100

## 2020-05-11 MED ORDER — HYDROCODONE-ACETAMINOPHEN 5-325 MG PO TABS
1.0000 | ORAL_TABLET | ORAL | Status: DC | PRN
  Administered 2020-05-11 – 2020-05-14 (×8): 1 via ORAL
  Administered 2020-05-16: 2 via ORAL
  Administered 2020-05-17: 1 via ORAL
  Filled 2020-05-11 (×7): qty 1
  Filled 2020-05-11: qty 2
  Filled 2020-05-11 (×2): qty 1

## 2020-05-11 MED ORDER — PHENOL 1.4 % MT LIQD: 1.0000 | OROMUCOSAL | Status: DC | PRN

## 2020-05-11 MED ORDER — ROSUVASTATIN CALCIUM 5 MG PO TABS
2.5000 mg | ORAL_TABLET | ORAL | Status: DC
  Administered 2020-05-13 – 2020-05-16 (×2): 2.5 mg via ORAL
  Filled 2020-05-11 (×2): qty 1

## 2020-05-11 MED ORDER — ONDANSETRON HCL 4 MG PO TABS: 4.0000 mg | ORAL_TABLET | Freq: Four times a day (QID) | ORAL | Status: DC | PRN

## 2020-05-11 MED ORDER — ONDANSETRON HCL 4 MG/2ML IJ SOLN: 4.0000 mg | Freq: Once | INTRAMUSCULAR | Status: DC | PRN

## 2020-05-11 MED ORDER — METOCLOPRAMIDE HCL 5 MG PO TABS: 5.0000 mg | ORAL_TABLET | Freq: Three times a day (TID) | ORAL | Status: DC | PRN

## 2020-05-11 MED ORDER — POLYETHYLENE GLYCOL 3350 17 G PO PACK
17.0000 g | PACK | Freq: Every day | ORAL | Status: DC | PRN
  Administered 2020-05-15: 17 g via ORAL
  Filled 2020-05-11: qty 1

## 2020-05-11 MED ORDER — PHENYLEPHRINE HCL-NACL 10-0.9 MG/250ML-% IV SOLN
INTRAVENOUS | Status: DC | PRN
  Administered 2020-05-11: 60 ug/min via INTRAVENOUS

## 2020-05-11 MED ORDER — 0.9 % SODIUM CHLORIDE (POUR BTL) OPTIME
TOPICAL | Status: DC | PRN
  Administered 2020-05-11: 1000 mL

## 2020-05-11 MED ORDER — ONDANSETRON HCL 4 MG/2ML IJ SOLN
INTRAMUSCULAR | Status: DC | PRN
  Administered 2020-05-11: 4 mg via INTRAVENOUS

## 2020-05-11 MED ORDER — MENTHOL 3 MG MT LOZG: 1.0000 | LOZENGE | OROMUCOSAL | Status: DC | PRN

## 2020-05-11 MED ORDER — ONDANSETRON HCL 4 MG/2ML IJ SOLN: 4.0000 mg | Freq: Four times a day (QID) | INTRAMUSCULAR | Status: DC | PRN

## 2020-05-11 MED ORDER — POVIDONE-IODINE 10 % EX SWAB
2.0000 "application " | Freq: Once | CUTANEOUS | Status: AC
  Administered 2020-05-11: 2 via TOPICAL

## 2020-05-11 MED ORDER — LIDOCAINE 2% (20 MG/ML) 5 ML SYRINGE
INTRAMUSCULAR | Status: AC
  Filled 2020-05-11: qty 5

## 2020-05-11 SURGICAL SUPPLY — 42 items
BAG DECANTER FOR FLEXI CONT (MISCELLANEOUS) IMPLANT
BAG ZIPLOCK 12X15 (MISCELLANEOUS) IMPLANT
BLADE SAG 18X100X1.27 (BLADE) ×2 IMPLANT
COVER PERINEAL POST (MISCELLANEOUS) ×2 IMPLANT
COVER SURGICAL LIGHT HANDLE (MISCELLANEOUS) ×2 IMPLANT
COVER WAND RF STERILE (DRAPES) IMPLANT
CUP ACETBLR 54 OD PINNACLE (Hips) ×2 IMPLANT
DECANTER SPIKE VIAL GLASS SM (MISCELLANEOUS) ×2 IMPLANT
DRAPE STERI IOBAN 125X83 (DRAPES) ×2 IMPLANT
DRAPE U-SHAPE 47X51 STRL (DRAPES) ×4 IMPLANT
DRSG AQUACEL AG ADV 3.5X10 (GAUZE/BANDAGES/DRESSINGS) ×2 IMPLANT
DURAPREP 26ML APPLICATOR (WOUND CARE) ×2 IMPLANT
ELECT REM PT RETURN 15FT ADLT (MISCELLANEOUS) ×2 IMPLANT
EVACUATOR 1/8 PVC DRAIN (DRAIN) IMPLANT
GLOVE SRG 8 PF TXTR STRL LF DI (GLOVE) ×1 IMPLANT
GLOVE SURG ENC MOIS LTX SZ6.5 (GLOVE) ×2 IMPLANT
GLOVE SURG ENC MOIS LTX SZ8 (GLOVE) ×4 IMPLANT
GLOVE SURG UNDER POLY LF SZ7 (GLOVE) IMPLANT
GLOVE SURG UNDER POLY LF SZ8 (GLOVE) ×2
GLOVE SURG UNDER POLY LF SZ8.5 (GLOVE) IMPLANT
GOWN STRL REUS W/TWL LRG LVL3 (GOWN DISPOSABLE) ×2 IMPLANT
GOWN STRL REUS W/TWL XL LVL3 (GOWN DISPOSABLE) IMPLANT
HEAD M SROM 36MM PLUS 1.5 (Hips) ×1 IMPLANT
HOLDER FOLEY CATH W/STRAP (MISCELLANEOUS) ×2 IMPLANT
KIT TURNOVER KIT A (KITS) ×2 IMPLANT
LINER NEUTRAL 54X36MM PLUS 4 (Hips) ×2 IMPLANT
MANIFOLD NEPTUNE II (INSTRUMENTS) ×2 IMPLANT
PACK ANTERIOR HIP CUSTOM (KITS) ×2 IMPLANT
PENCIL SMOKE EVACUATOR COATED (MISCELLANEOUS) ×2 IMPLANT
SROM M HEAD 36MM PLUS 1.5 (Hips) ×2 IMPLANT
STEM FEMORAL SZ6 HIGH ACTIS (Stem) ×2 IMPLANT
STRIP CLOSURE SKIN 1/2X4 (GAUZE/BANDAGES/DRESSINGS) ×4 IMPLANT
SUT ETHIBOND NAB CT1 #1 30IN (SUTURE) ×2 IMPLANT
SUT MNCRL AB 4-0 PS2 18 (SUTURE) ×2 IMPLANT
SUT STRATAFIX 0 PDS 27 VIOLET (SUTURE) ×2
SUT VIC AB 2-0 CT1 27 (SUTURE) ×4
SUT VIC AB 2-0 CT1 TAPERPNT 27 (SUTURE) ×2 IMPLANT
SUTURE STRATFX 0 PDS 27 VIOLET (SUTURE) ×1 IMPLANT
SYR 50ML LL SCALE MARK (SYRINGE) IMPLANT
TRAY CATH INTERMITTENT SS 16FR (CATHETERS) ×2 IMPLANT
TRAY FOLEY MTR SLVR 16FR STAT (SET/KITS/TRAYS/PACK) ×2 IMPLANT
TUBE SUCTION HIGH CAP CLEAR NV (SUCTIONS) ×2 IMPLANT

## 2020-05-11 NOTE — Transfer of Care (Signed)
Immediate Anesthesia Transfer of Care Note  Patient: Nevan Schwenke  Procedure(s) Performed: TOTAL HIP ARTHROPLASTY ANTERIOR APPROACH (Right Hip)  Patient Location: PACU  Anesthesia Type:General  Level of Consciousness: drowsy  Airway & Oxygen Therapy: Patient Spontanous Breathing and Patient connected to face mask oxygen  Post-op Assessment: Report given to RN and Post -op Vital signs reviewed and stable  Post vital signs: Reviewed and stable  Last Vitals:  Vitals Value Taken Time  BP 141/82 05/11/20 1624  Temp    Pulse 61 05/11/20 1626  Resp 18 05/11/20 1626  SpO2 100 % 05/11/20 1626  Vitals shown include unvalidated device data.  Last Pain:  Vitals:   05/11/20 1239  TempSrc:   PainSc: 0-No pain      Patients Stated Pain Goal: 3 (78/24/23 5361)  Complications: No complications documented.

## 2020-05-11 NOTE — Anesthesia Procedure Notes (Signed)
Procedure Name: Intubation Date/Time: 05/11/2020 2:11 PM Performed by: Garrel Ridgel, CRNA Pre-anesthesia Checklist: Patient identified, Emergency Drugs available, Suction available and Patient being monitored Patient Re-evaluated:Patient Re-evaluated prior to induction Oxygen Delivery Method: Circle system utilized Preoxygenation: Pre-oxygenation with 100% oxygen Induction Type: IV induction Ventilation: Mask ventilation without difficulty Laryngoscope Size: Mac and 4 Grade View: Grade I Tube type: Oral Tube size: 7.5 mm Number of attempts: 1 Airway Equipment and Method: Stylet and Oral airway Placement Confirmation: ETT inserted through vocal cords under direct vision,  positive ETCO2 and breath sounds checked- equal and bilateral Secured at: 23 cm Tube secured with: Tape Dental Injury: Teeth and Oropharynx as per pre-operative assessment

## 2020-05-11 NOTE — Interval H&P Note (Signed)
History and Physical Interval Note:  05/11/2020 12:56 PM  [name]  has presented today for surgery, with the diagnosis of right hip osteoarthritis.  The various methods of treatment have been discussed with the patient and family. After consideration of risks, benefits and other options for treatment, the patient has consented to  Procedure(s) with comments: [place] (Right) - 118min as a surgical intervention.  The patient's history has been reviewed, patient examined, no change in status, stable for surgery.  I have reviewed the patient's chart and labs.  Questions were answered to the patient's satisfaction.     Pilar Plate Johnnette Laux

## 2020-05-11 NOTE — Op Note (Signed)
OPERATIVE REPORT- TOTAL HIP ARTHROPLASTY   PREOPERATIVE DIAGNOSIS: Osteoarthritis of the Right hip.   POSTOPERATIVE DIAGNOSIS: Osteoarthritis of the Right  hip.   PROCEDURE: Right total hip arthroplasty, anterior approach.   SURGEON: [name], MD   ASSISTANT: Theresa Duty, PA-C  ANESTHESIA:  General  ESTIMATED BLOOD LOSS:-450 mL    DRAINS: Hemovac x1.   COMPLICATIONS: None   CONDITION: PACU - hemodynamically stable.   BRIEF CLINICAL NOTE: [name] is a 87 y.o. male who has advanced end-  stage arthritis of their Right  hip with progressively worsening pain and  dysfunction.The patient has failed nonoperative management and presents for  total hip arthroplasty.   PROCEDURE IN DETAIL: After successful administration of spinal  anesthetic, the traction boots for the [facility] bed were placed on both  feet and the patient was placed onto the [facility] bed, boots placed into the leg  holders. The Right hip was then isolated from the perineum with plastic  drapes and prepped and draped in the usual sterile fashion. ASIS and  greater trochanter were marked and a oblique incision was made, starting  at about 1 cm lateral and 2 cm distal to the ASIS and coursing towards  the anterior cortex of the femur. The skin was cut with a 10 blade  through subcutaneous tissue to the level of the fascia overlying the  tensor fascia lata muscle. The fascia was then incised in line with the  incision at the junction of the anterior third and posterior 2/3rd. The  muscle was teased off the fascia and then the interval between the TFL  and the rectus was developed. The Hohmann retractor was then placed at  the top of the femoral neck over the capsule. The vessels overlying the  capsule were cauterized and the fat on top of the capsule was removed.  A Hohmann retractor was then placed anterior underneath the rectus  femoris to give exposure to the entire anterior capsule. A T-shaped   capsulotomy was performed. The edges were tagged and the femoral head  was identified.       Osteophytes are removed off the superior acetabulum.  The femoral neck was then cut in situ with an oscillating saw. Traction  was then applied to the left lower extremity utilizing the [facility]  traction. The femoral head was then removed. Retractors were placed  around the acetabulum and then circumferential removal of the labrum was  performed. Osteophytes were also removed. Reaming starts at 49 mm to  medialize and  Increased in 2 mm increments to 53 mm. We reamed in  approximately 40 degrees of abduction, 20 degrees anteversion. A 54 mm  pinnacle acetabular shell was then impacted in anatomic position under  fluoroscopic guidance with excellent purchase. We did not need to place  any additional dome screws. A 36 mm neutral + 4 marathon liner was then  placed into the acetabular shell.       The femoral lift was then placed along the lateral aspect of the femur  just distal to the vastus ridge. The leg was  externally rotated and capsule  was stripped off the inferior aspect of the femoral neck down to the  level of the lesser trochanter, this was done with electrocautery. The femur was lifted after this was performed. The  leg was then placed in an extended and adducted position essentially delivering the femur. We also removed the capsule superiorly and the piriformis from the piriformis fossa  to gain excellent exposure of the  proximal femur. Rongeur was used to remove some cancellous bone to get  into the lateral portion of the proximal femur for placement of the  initial starter reamer. The starter broaches was placed  the starter broach  and was shown to go down the center of the canal. Broaching  with the Actis system was then performed starting at size 0  coursing  Up to size 6. A size 6 had excellent torsional and rotational  and axial stability. The trial high offset neck was then placed   with a 36 + 1.5 trial head. The hip was then reduced. We confirmed that  the stem was in the canal both on AP and lateral x-rays. It also has excellent sizing. The hip was reduced with outstanding stability through full extension and full external rotation.. AP pelvis was taken and the leg lengths were measured and found to be equal. Hip was then dislocated again and the femoral head and neck removed. The  femoral broach was removed. Size 6 Actis stem with a high offset  neck was then impacted into the femur following native anteversion. Has  excellent purchase in the canal. Excellent torsional and rotational and  axial stability. It is confirmed to be in the canal on AP and lateral  fluoroscopic views. The 36 + 1.5 metal head was placed and the hip  reduced with outstanding stability. Again AP pelvis was taken and it  confirmed that the leg lengths were equal. The wound was then copiously  irrigated with saline solution and the capsule reattached and repaired  with Ethibond suture. 30 ml of .25% Bupivicaine was  injected into the capsule and into the edge of the tensor fascia lata as well as subcutaneous tissue. The fascia overlying the tensor fascia lata was then closed with a running #1 V-Loc. Subcu was closed with interrupted 2-0 Vicryl and subcuticular running 4-0 Monocryl. Incision was cleaned  and dried. Steri-Strips and a bulky sterile dressing applied. Hemovac  drain was hooked to suction and then the patient was awakened and transported to  recovery in stable condition.        Please note that a surgical assistant was a medical necessity for this procedure to perform it in a safe and expeditious manner. Assistant was necessary to provide appropriate retraction of vital neurovascular structures and to prevent femoral fracture and allow for anatomic placement of the prosthesis.  [name], M.D.

## 2020-05-11 NOTE — Plan of Care (Signed)
  Problem: Pain Management: Goal: Pain level will decrease with appropriate interventions Outcome: Progressing   Problem: Safety: Goal: Ability to remain free from injury will improve Outcome: Progressing   Problem: Elimination: Goal: Will not experience complications related to bowel motility Outcome: Progressing Goal: Will not experience complications related to urinary retention Outcome: Progressing

## 2020-05-11 NOTE — Discharge Instructions (Signed)
[name], MD Total Joint Specialist [facility] [place] [address] [place], [place] [address] [phone]  ANTERIOR APPROACH TOTAL HIP REPLACEMENT POSTOPERATIVE DIRECTIONS     Hip Rehabilitation, Guidelines Following Surgery  The results of a hip operation are greatly improved after range of motion and muscle strengthening exercises. Follow all safety measures which are given to protect your hip. If any of these exercises cause increased pain or swelling in your joint, decrease the amount until you are comfortable again. Then slowly increase the exercises. Call your caregiver if you have problems or questions.    HOME CARE INSTRUCTIONS  . Remove items at home which could result in a fall. This includes throw rugs or furniture in walking pathways.   ICE to the affected hip as frequently as 20-30 minutes an hour and then as needed for pain and swelling. Continue to use ice on the hip for pain and swelling from surgery. You may notice swelling that will progress down to the foot and ankle. This is normal after surgery. Elevate the leg when you are not up walking on it.    Continue to use the breathing machine which will help keep your temperature down.  It is common for your temperature to cycle up and down following surgery, especially at night when you are not up moving around and exerting yourself.  The breathing machine keeps your lungs expanded and your temperature down.  DIET You may resume your previous home diet once your are discharged from the hospital.  DRESSING / WOUND CARE / SHOWERING . You have an adhesive waterproof bandage over the incision. Leave this in place until your first follow-up appointment. Once you remove this you will not need to place another bandage.  . You may begin showering 3 days following surgery, but do not submerge the incision under water.  ACTIVITY . For the first 3-5 days, it is important to rest and keep the operative  leg elevated. You should, as a general rule, rest for 50 minutes and walk/stretch for 10 minutes per hour. After 5 days, you may slowly increase activity as tolerated.  Marland Kitchen Perform the exercises you were provided twice a day for about 15-20 minutes each session. Begin these 2 days following surgery. . Walk with your walker as instructed. Use the walker until you are comfortable transitioning to a cane. Walk with the cane in the opposite hand of the operative leg. You may discontinue the cane once you are comfortable and walking steadily. . Avoid periods of inactivity such as sitting longer than an hour when not asleep. This helps prevent blood clots.  . Do not drive a car for 6 weeks or until released by your surgeon.  . Do not drive while taking narcotics.  TED HOSE STOCKINGS Wear the elastic stockings on both legs for three weeks following surgery during the day. You may remove them at night while sleeping.  WEIGHT BEARING Weight bearing as tolerated with assist device (walker, cane, etc) as directed, use it as long as suggested by your surgeon or therapist, typically at least 4-6 weeks.  POSTOPERATIVE CONSTIPATION PROTOCOL Constipation - defined medically as fewer than three stools per week and severe constipation as less than one stool per week.  One of the most common issues patients have following surgery is constipation.  Even if you have a regular bowel pattern at home, your normal regimen is likely to be disrupted due to multiple reasons following surgery.  Combination of anesthesia, postoperative  narcotics, change in appetite and fluid intake all can affect your bowels.  In order to avoid complications following surgery, here are some recommendations in order to help you during your recovery period.  . Colace (docusate) - Pick up an over-the-counter form of Colace or another stool softener and take twice a day as long as you are requiring postoperative pain medications.  Take with a full  glass of water daily.  If you experience loose stools or diarrhea, hold the colace until you stool forms back up.  If your symptoms do not get better within 1 week or if they get worse, check with your doctor. . Dulcolax (bisacodyl) - Pick up over-the-counter and take as directed by the product packaging as needed to assist with the movement of your bowels.  Take with a full glass of water.  Use this product as needed if not relieved by Colace only.  . MiraLax (polyethylene glycol) - Pick up over-the-counter to have on hand.  MiraLax is a solution that will increase the amount of water in your bowels to assist with bowel movements.  Take as directed and can mix with a glass of water, juice, soda, coffee, or tea.  Take if you go more than two days without a movement.Do not use MiraLax more than once per day. Call your doctor if you are still constipated or irregular after using this medication for 7 days in a row.  If you continue to have problems with postoperative constipation, please contact the office for further assistance and recommendations.  If you experience "the worst abdominal pain ever" or develop nausea or vomiting, please contact the office immediatly for further recommendations for treatment.  ITCHING  If you experience itching with your medications, try taking only a single pain pill, or even half a pain pill at a time.  You can also use Benadryl over the counter for itching or also to help with sleep.   MEDICATIONS See your medication summary on the "After Visit Summary" that the nursing staff will review with you prior to discharge.  You may have some home medications which will be placed on hold until you complete the course of blood thinner medication.  It is important for you to complete the blood thinner medication as prescribed by your surgeon.  Continue your approved medications as instructed at time of discharge.  PRECAUTIONS If you experience chest pain or shortness of breath -  call 911 immediately for transfer to the hospital emergency department.  If you develop a fever greater that 101 F, purulent drainage from wound, increased redness or drainage from wound, foul odor from the wound/dressing, or calf pain - CONTACT YOUR SURGEON.                                                   FOLLOW-UP APPOINTMENTS Make sure you keep all of your appointments after your operation with your surgeon and caregivers. You should call the office at the above phone number and make an appointment for approximately two weeks after the date of your surgery or on the date instructed by your surgeon outlined in the "After Visit Summary".  RANGE OF MOTION AND STRENGTHENING EXERCISES  These exercises are designed to help you keep full movement of your hip joint. Follow your caregiver's or physical therapist's instructions. Perform all exercises about fifteen times,  three times per day or as directed. Exercise both hips, even if you have had only one joint replacement. These exercises can be done on a training (exercise) mat, on the floor, on a table or on a bed. Use whatever works the best and is most comfortable for you. Use music or television while you are exercising so that the exercises are a pleasant break in your day. This will make your life better with the exercises acting as a break in routine you can look forward to.  . Lying on your back, slowly slide your foot toward your buttocks, raising your knee up off the floor. Then slowly slide your foot back down until your leg is straight again.  . Lying on your back spread your legs as far apart as you can without causing discomfort.  . Lying on your side, raise your upper leg and foot straight up from the floor as far as is comfortable. Slowly lower the leg and repeat.  . Lying on your back, tighten up the muscle in the front of your thigh (quadriceps muscles). You can do this by keeping your leg straight and trying to raise your heel off the  floor. This helps strengthen the largest muscle supporting your knee.  . Lying on your back, tighten up the muscles of your buttocks both with the legs straight and with the knee bent at a comfortable angle while keeping your heel on the floor.   POST-OPERATIVE OPIOID TAPER INSTRUCTIONS: . It is important to wean off of your opioid medication as soon as possible. If you do not need pain medication after your surgery it is ok to stop day one. Marland Kitchen Opioids include: o Codeine, Hydrocodone(Norco, Vicodin), Oxycodone(Percocet, oxycontin) and hydromorphone amongst others.  . Long term and even short term use of opiods can cause: o Increased pain response o Dependence o Constipation o Depression o Respiratory depression o And more.  . Withdrawal symptoms can include o Flu like symptoms o Nausea, vomiting o And more . Techniques to manage these symptoms o Hydrate well o Eat regular healthy meals o Stay active o Use relaxation techniques(deep breathing, meditating, yoga) . Do Not substitute Alcohol to help with tapering . If you have been on opioids for less than two weeks and do not have pain than it is ok to stop all together.  . Plan to wean off of opioids o This plan should start within one week post op of your joint replacement. o Maintain the same interval or time between taking each dose and first decrease the dose.  o Cut the total daily intake of opioids by one tablet each day o Next start to increase the time between doses. o The last dose that should be eliminated is the evening dose.     IF YOU ARE TRANSFERRED TO A SKILLED REHAB FACILITY If the patient is transferred to a skilled rehab facility following release from the hospital, a list of the current medications will be sent to the facility for the patient to continue.  When discharged from the skilled rehab facility, please have the facility set up the patient's [place] prior to being released. Also, the  skilled facility will be responsible for providing the patient with their medications at time of release from the facility to include their pain medication, the muscle relaxants, and their blood thinner medication. If the patient is still at the rehab facility at time of the two week follow up appointment, the skilled rehab  facility will also need to assist the patient in arranging follow up appointment in our office and any transportation needs.  MAKE SURE YOU:  . Understand these instructions.  . Get help right away if you are not doing well or get worse.    DENTAL ANTIBIOTICS:  In most cases prophylactic antibiotics for Dental procdeures after total joint surgery are not necessary.  Exceptions are as follows:  1. History of prior total joint infection  2. Severely immunocompromised (Organ Transplant, cancer chemotherapy, Rheumatoid biologic meds such as [place])  3. Poorly controlled diabetes (A1C &gt; 8.0, blood glucose over 200)  If you have one of these conditions, contact your surgeon for an antibiotic prescription, prior to your dental procedure.    Pick up stool softner and laxative for home use following surgery while on pain medications. Do not submerge incision under water. Please use good hand washing techniques while changing dressing each day. May shower starting three days after surgery. Please use a clean towel to pat the incision dry following showers. Continue to use ice for pain and swelling after surgery. Do not use any lotions or creams on the incision until instructed by your surgeon.

## 2020-05-11 NOTE — Anesthesia Postprocedure Evaluation (Signed)
Anesthesia Post Note  Patient: Education administrator  Procedure(s) Performed: TOTAL HIP ARTHROPLASTY ANTERIOR APPROACH (Right Hip)     Patient location during evaluation: PACU Anesthesia Type: General Level of consciousness: awake and alert Pain management: pain level controlled Vital Signs Assessment: post-procedure vital signs reviewed and stable Respiratory status: spontaneous breathing, nonlabored ventilation, respiratory function stable and patient connected to nasal cannula oxygen Cardiovascular status: blood pressure returned to baseline and stable Postop Assessment: no apparent nausea or vomiting Anesthetic complications: no   No complications documented.  Last Vitals:  Vitals:   05/11/20 1715 05/11/20 1730  BP: (!) 146/82 130/76  Pulse: 81 67  Resp: 16 18  Temp:  36.7 C  SpO2: 100% 100%    Last Pain:  Vitals:   05/11/20 1730  TempSrc:   PainSc: 0-No pain                 Audry Pili

## 2020-05-12 ENCOUNTER — Encounter (HOSPITAL_COMMUNITY): Payer: Self-pay | Admitting: Orthopedic Surgery

## 2020-05-12 DIAGNOSIS — Z85828 Personal history of other malignant neoplasm of skin: Secondary | ICD-10-CM | POA: Diagnosis not present

## 2020-05-12 DIAGNOSIS — Z7901 Long term (current) use of anticoagulants: Secondary | ICD-10-CM | POA: Diagnosis not present

## 2020-05-12 DIAGNOSIS — M1611 Unilateral primary osteoarthritis, right hip: Secondary | ICD-10-CM | POA: Diagnosis not present

## 2020-05-12 DIAGNOSIS — I10 Essential (primary) hypertension: Secondary | ICD-10-CM | POA: Diagnosis not present

## 2020-05-12 DIAGNOSIS — I4891 Unspecified atrial fibrillation: Secondary | ICD-10-CM | POA: Diagnosis not present

## 2020-05-12 DIAGNOSIS — Z79899 Other long term (current) drug therapy: Secondary | ICD-10-CM | POA: Diagnosis not present

## 2020-05-12 DIAGNOSIS — Z20822 Contact with and (suspected) exposure to covid-19: Secondary | ICD-10-CM | POA: Diagnosis not present

## 2020-05-12 LAB — BASIC METABOLIC PANEL
Anion gap: 10 (ref 5–15)
BUN: 14 mg/dL (ref 8–23)
CO2: 24 mmol/L (ref 22–32)
Calcium: 8.9 mg/dL (ref 8.9–10.3)
Chloride: 100 mmol/L (ref 98–111)
Creatinine, Ser: 1.12 mg/dL (ref 0.61–1.24)
GFR, Estimated: >60 mL/min (ref >60)
Glucose, Bld: 155 mg/dL — ABNORMAL HIGH (ref 70–99)
Potassium: 5 mmol/L (ref 3.5–5.1)
Sodium: 134 mmol/L — ABNORMAL LOW (ref 135–145)

## 2020-05-12 LAB — CBC
HCT: 46.9 % (ref 39.0–52.0)
Hemoglobin: 15.6 g/dL (ref 13.0–17.0)
MCH: 32.2 pg (ref 26.0–34.0)
MCHC: 33.3 g/dL (ref 30.0–36.0)
MCV: 96.7 fL (ref 80.0–100.0)
Platelets: 282 10*3/uL (ref 150–400)
RBC: 4.85 MIL/uL (ref 4.22–5.81)
RDW: 12.4 % (ref 11.5–15.5)
WBC: 17.9 10*3/uL — ABNORMAL HIGH (ref 4.0–10.5)
nRBC: 0 % (ref 0.0–0.2)

## 2020-05-12 NOTE — Progress Notes (Signed)
Patient states he does not feel the need to urinate at this time, no bladder distention noted and bladder scan showed at most 186 cc's after multiple scans, patient dangled at the bedside without issues, will continue to monitor for voiding- urinal at the bedside.

## 2020-05-12 NOTE — Progress Notes (Signed)
Physical Therapy Treatment Patient Details Name: [name] MRN: [id number] DOB: [birth year] Today's Date: 05/12/2020    History of Present Illness 87 y.o. male admitted 05/11/20 for R AA-THA. PMH includes afib, HTN, skin cancer.    PT Comments    Pt ambulated 4' from bedside commode to bed, distance limited by R hip pain and fatigue. Performed R hip exercises with min assist.   Follow Up Recommendations  Follow surgeon's recommendation for DC plan and follow-up therapies     Equipment Recommendations  None recommended by PT    Recommendations for Other Services       Precautions / Restrictions Precautions Precautions: Fall Precaution Comments: pt unable to provide fall hx (very HOH and vague historian) Restrictions Weight Bearing Restrictions: No RLE Weight Bearing: Weight bearing as tolerated    Mobility  Bed Mobility Overal bed mobility: Needs Assistance Bed Mobility: Sit to Supine     Supine to sit: Mod assist;HOB elevated Sit to supine: Mod assist   General bed mobility comments: mod A for LEs into bed    Transfers Overall transfer level: Needs assistance Equipment used: Rolling walker (2 wheeled) Transfers: Sit to/from Stand Sit to Stand: Mod assist;From elevated surface         General transfer comment: assist to rise/steady from 3 in 1  Ambulation/Gait Ambulation/Gait assistance: Min guard Gait Distance (Feet): 4 Feet Assistive device: Rolling walker (2 wheeled) Gait Pattern/deviations: Step-to pattern;Decreased step length - right;Decreased step length - left;Decreased weight shift to right Gait velocity: decr   General Gait Details: VCs sequencing, distance limited by R hip pain, pt walked from bedside commode to bed, didn't feel he could tolerate further distance   Stairs             Wheelchair Mobility    Modified Rankin (Stroke Patients Only)       Balance Overall balance assessment: Needs assistance Sitting-balance support:  Feet supported;No upper extremity supported Sitting balance-Leahy Scale: Good     Standing balance support: Single extremity supported Standing balance-Leahy Scale: Poor Standing balance comment: relies on BUE support                            Cognition Arousal/Alertness: Awake/alert Behavior During Therapy: WFL for tasks assessed/performed Overall Cognitive Status: Difficult to assess                                 General Comments: no family present, pt very HOH, vague historian but this may be due to [facility]      Exercises Total Joint Exercises Ankle Circles/Pumps: AROM;Both;10 reps;Supine Heel Slides: AAROM;Right;10 reps;Supine Hip ABduction/ADduction: AAROM;Right;10 reps;Supine Long Arc Quad: AROM;Right;10 reps;Seated    General Comments        Pertinent Vitals/Pain Pain Assessment: Faces Pain Score: 7  Faces Pain Scale: Hurts even more Pain Location: R hip with movement Pain Descriptors / Indicators: Grimacing;Guarding Pain Intervention(s): Limited activity within patient's tolerance;Monitored during session;Ice applied;Patient requesting pain meds-RN notified;Repositioned    Home Living Family/patient expects to be discharged to:: Private residence Living Arrangements: Alone     Home Access: Stairs to enter   Brave: Two level;Able to live on main level with bedroom/bathroom Home Equipment: Gilford Rile - 2 wheels;Cane - single point;Bedside commode Additional Comments: son lives nearby, pt stated, "Dr. Wynelle Link and the [place] will send people to help" him, pt vague about level of assistance  available at home (unclear if he can hear my questions as he's very HOH)    Prior Function Level of Independence: Independent with assistive device(s)      Comments: walked with [facility]   PT Goals (current goals can now be found in the care plan section) Acute Rehab PT Goals Patient Stated Goal: none stated PT Goal Formulation: With patient/family Time  For Goal Achievement: 05/19/20 Potential to Achieve Goals: Good Progress towards PT goals: Progressing toward goals    Frequency    7X/week      PT Plan Current plan remains appropriate    Co-evaluation              AM-PAC PT "6 Clicks" Mobility   Outcome Measure  Help needed turning from your back to your side while in a flat bed without using bedrails?: A Little Help needed moving from lying on your back to sitting on the side of a flat bed without using bedrails?: A Lot Help needed moving to and from a bed to a chair (including a wheelchair)?: A Lot Help needed standing up from a chair using your arms (e.g., wheelchair or bedside chair)?: A Lot Help needed to walk in hospital room?: A Little Help needed climbing 3-5 steps with a railing? : Total 6 Click Score: 13    End of Session Equipment Utilized During Treatment: Gait belt Activity Tolerance: Patient limited by pain;Patient limited by fatigue Patient left: with call bell/phone within reach;in bed;with bed alarm set Nurse Communication: Mobility status;Patient requests pain meds PT Visit Diagnosis: Difficulty in walking, not elsewhere classified (R26.2);Pain Pain - Right/Left: Right Pain - part of body: Hip     Time: 9485-4627 PT Time Calculation (min) (ACUTE ONLY): 15 min  Charges:  $Therapeutic Exercise: 8-22 mins                     Blondell Reveal Kistler PT 05/12/2020  Acute Rehabilitation Services Pager [phone] Office [phone]

## 2020-05-12 NOTE — TOC Transition Note (Signed)
Transition of Care [facility]) - CM/SW Discharge Note  Patient Details  Name: [name] MRN: [id number] Date of Birth: 07-16-1929  [facility] Care [facility] CM/SW Contact:  Sherie Don, LCSW Phone Number: 05/12/2020, 9:47 AM  Clinical Narrative: Patient expected to discharge today after working with PT. CSW met with patient to review discharge plan. Patient will discharge with a home exercise program (HEP). Patient has a rolling walker, shower chair, and BSC at home, so he has no DME needs at this time. TOC signing off.  Final next level of care: Home/Self Care Barriers to Discharge: No Barriers Identified  Patient Goals and CMS Choice Patient states their goals for this hospitalization and ongoing recovery are:: Discharge home with HEP CMS Medicare.gov Compare Post Acute Care list provided to:: Patient Choice offered to / list presented to : Patient  Discharge Plan and Services       DME Arranged: N/A DME Agency: NA  Readmission Risk Interventions No flowsheet data found.

## 2020-05-12 NOTE — Plan of Care (Signed)
Plan of care discussed with pt.

## 2020-05-12 NOTE — Progress Notes (Addendum)
   Subjective: 1 Day Post-Op Procedure(s) (LRB): TOTAL HIP ARTHROPLASTY ANTERIOR APPROACH (Right) Patient reports pain as moderate.   Patient seen in rounds by Dr. Wynelle Link. Patient is well, and has had no acute complaints or problems. No acute overnight events. Patient denies SOB, chest pain, or calf pain. Foley to be pulled this am.  We will begin therapy today.   Objective: Vital signs in last 24 hours: Temp:  [97.7 F (36.5 C)-98.5 F (36.9 C)] 97.7 F (36.5 C) (04/07 0417) Pulse Rate:  [67-120] 71 (04/07 0417) Resp:  [12-22] 18 (04/07 0417) BP: (126-187)/(76-108) 146/80 (04/07 0417) SpO2:  [98 %-100 %] 98 % (04/07 0417) Weight:  [111.6 kg] 111.6 kg (04/06 1239)  Intake/Output from previous day:  Intake/Output Summary (Last 24 hours) at 05/12/2020 0719 Last data filed at 05/12/2020 0310 Gross per 24 hour  Intake 2640 ml  Output 1700 ml  Net 940 ml     Intake/Output this shift: No intake/output data recorded.  Labs: Recent Labs    05/11/20 1230 05/12/20 0310  HGB 16.3 15.6   Recent Labs    05/11/20 1230 05/12/20 0310  WBC 7.3 17.9*  RBC 5.06 4.85  HCT 48.8 46.9  PLT 215 282   Recent Labs    05/11/20 1250 05/12/20 0310  NA 135 134*  K 3.7 5.0  CL 101 100  CO2 26 24  BUN 14 14  CREATININE 1.03 1.12  GLUCOSE 128* 155*  CALCIUM 9.1 8.9   Recent Labs    05/11/20 1250  INR 1.1    Exam: General - Patient is Alert and Oriented Extremity - Neurologically intact Neurovascular intact Intact pulses distally Dorsiflexion/Plantar flexion intact Dressing - dressing C/D/I Motor Function - intact, moving foot and toes well on exam.   Past Medical History:  Diagnosis Date  . Arthritis   . Atrial fibrillation ([place]) 04/28/2020  . Hyperlipidemia   . Hypertension   . Skin cancer    Face    Assessment/Plan: 1 Day Post-Op Procedure(s) (LRB): TOTAL HIP ARTHROPLASTY ANTERIOR APPROACH (Right) Principal Problem:   OA (osteoarthritis) of hip Active  Problems:   Osteoarthritis of right hip  Estimated body mass index is 32.46 kg/m as calculated from the following:   Height as of this encounter: 6\' 1"  (1.854 m).   Weight as of this encounter: 111.6 kg. Advance diet Up with therapy  DVT Prophylaxis - TED hose and Eliquis Weight bearing as tolerated. Continue therapy.  Plan for two sessions of PT today and two sessions of PT tomorrow, and if meeting goals tomorrow, will plan for discharge at that time.   Patient to follow up in two weeks with Dr. Wynelle Link in clinic.   Plan is to go Home after hospital stay.   The PDMP database was reviewed today prior to any opioid medications being prescribed to this patient.  Fenton Foy, [place], PA-C Orthopedic Surgery [phone] 05/12/2020, 7:19 AM

## 2020-05-12 NOTE — Evaluation (Signed)
Physical Therapy Evaluation Patient Details Name: [name] MRN: [id number] DOB: 11/21/1929 Today's Date: 05/12/2020   History of Present Illness  87 y.o. male admitted 05/11/20 for R AA-THA. PMH includes afib, HTN, skin cancer.  Clinical Impression  Pt is s/p THA resulting in the deficits listed below (see PT Problem List). Mod assist for supine to sit, mod A for sit to stand. Pt ambulated 10' with RW, distance limited by pain and fatigue. Pt is very HOH so difficult to attain prior level of function and home situation. Pt lives alone, son lives next door. Spoke to pt's daughter by phone and encouraged family to provide 24/7 assist for first few days pt is home to minimize fall risk, she verbalized understanding.  Pt will benefit from skilled PT to increase their independence and safety with mobility to allow discharge to the venue listed below.      Follow Up Recommendations Follow surgeon's recommendation for DC plan and follow-up therapies    Equipment Recommendations  3in1 (PT)    Recommendations for Other Services       Precautions / Restrictions Precautions Precautions: Fall Precaution Comments: pt unable to provide fall hx (very HOH and vague historian) Restrictions Weight Bearing Restrictions: No RLE Weight Bearing: Weight bearing as tolerated      Mobility  Bed Mobility Overal bed mobility: Needs Assistance Bed Mobility: Supine to Sit     Supine to sit: Mod assist;HOB elevated     General bed mobility comments: assist to advance RLE and to raise trunk    Transfers Overall transfer level: Needs assistance Equipment used: Rolling walker (2 wheeled) Transfers: Sit to/from Stand Sit to Stand: Mod assist;From elevated surface         General transfer comment: assist to rise/steady  Ambulation/Gait Ambulation/Gait assistance: Min guard Gait Distance (Feet): 10 Feet Assistive device: Rolling walker (2 wheeled) Gait Pattern/deviations: Step-to  pattern;Decreased step length - right;Decreased step length - left;Decreased weight shift to right Gait velocity: decr   General Gait Details: VCs sequencing, distance limited by R hip pain  Stairs            Wheelchair Mobility    Modified Rankin (Stroke Patients Only)       Balance Overall balance assessment: Needs assistance Sitting-balance support: Feet supported;No upper extremity supported Sitting balance-Leahy Scale: Good     Standing balance support: Single extremity supported Standing balance-Leahy Scale: Poor Standing balance comment: relies on BUE support                             Pertinent Vitals/Pain Pain Assessment: Faces Faces Pain Scale: Hurts even more Pain Location: R hip with movement Pain Descriptors / Indicators: Grimacing;Guarding Pain Intervention(s): Limited activity within patient's tolerance;Monitored during session;Premedicated before session;Ice applied    Home Living Family/patient expects to be discharged to:: Private residence Living Arrangements: Alone     Home Access: Stairs to enter   Entrance Stairs-Number of Steps: 1 Home Layout: Two level;Able to live on main level with bedroom/bathroom Home Equipment: Gilford Rile - 2 wheels;Cane - single point Additional Comments: son lives nearby, pt stated, "Dr. Wynelle Link and the [place] will send people to help" him, pt vague about level of assistance available at home (unclear if he can hear my questions as he's very [facility])    Prior Function Level of Independence: Independent with assistive device(s)         Comments: walked with [facility]  Hand Dominance        Extremity/Trunk Assessment   Upper Extremity Assessment Upper Extremity Assessment: Overall WFL for tasks assessed    Lower Extremity Assessment Lower Extremity Assessment: RLE deficits/detail RLE Deficits / Details: hip flexion AAROM to ~35* limited by pain RLE: Unable to fully assess due to pain RLE Sensation:  WNL RLE Coordination: WNL    Cervical / Trunk Assessment Cervical / Trunk Assessment: Normal  Communication   Communication: HOH  Cognition Arousal/Alertness: Awake/alert Behavior During Therapy: WFL for tasks assessed/performed Overall Cognitive Status: Difficult to assess                                 General Comments: no family present, pt very HOH, vague historian but this may be due to [place] Comments      Exercises Total Joint Exercises Ankle Circles/Pumps: AROM;Both;10 reps;Supine Heel Slides: AAROM;Right;10 reps;Supine Hip ABduction/ADduction: AAROM;Right;10 reps;Supine   Assessment/Plan    PT Assessment Patient needs continued PT services  PT Problem List Decreased strength;Decreased range of motion;Decreased activity tolerance;Decreased balance;Decreased knowledge of use of DME;Decreased mobility;Pain       PT Treatment Interventions      PT Goals (Current goals can be found in the Care Plan section)  Acute Rehab PT Goals Patient Stated Goal: none stated PT Goal Formulation: With family Time For Goal Achievement: 05/19/20 Potential to Achieve Goals: Good    Frequency 7X/week   Barriers to discharge        Co-evaluation               AM-PAC PT "6 Clicks" Mobility  Outcome Measure Help needed turning from your back to your side while in a flat bed without using bedrails?: A Little Help needed moving from lying on your back to sitting on the side of a flat bed without using bedrails?: A Lot Help needed moving to and from a bed to a chair (including a wheelchair)?: A Lot Help needed standing up from a chair using your arms (e.g., wheelchair or bedside chair)?: A Lot Help needed to walk in hospital room?: A Little Help needed climbing 3-5 steps with a railing? : Total 6 Click Score: 13    End of Session Equipment Utilized During Treatment: Gait belt Activity Tolerance: Patient limited by pain;Patient limited by  fatigue Patient left: in chair;with call bell/phone within reach;with chair alarm set Nurse Communication: Mobility status PT Visit Diagnosis: Difficulty in walking, not elsewhere classified (R26.2);Pain Pain - Right/Left: Right Pain - part of body: Hip    Time: 3532-9924 PT Time Calculation (min) (ACUTE ONLY): 22 min   Charges:   PT Evaluation $PT Eval Moderate Complexity: 1 Mod        Philomena Doheny PT 05/12/2020  Acute Rehabilitation Services Pager [phone] Office [phone]

## 2020-05-13 ENCOUNTER — Telehealth: Payer: Self-pay

## 2020-05-13 DIAGNOSIS — Z85828 Personal history of other malignant neoplasm of skin: Secondary | ICD-10-CM | POA: Diagnosis not present

## 2020-05-13 DIAGNOSIS — M1611 Unilateral primary osteoarthritis, right hip: Secondary | ICD-10-CM | POA: Diagnosis not present

## 2020-05-13 DIAGNOSIS — Z79899 Other long term (current) drug therapy: Secondary | ICD-10-CM | POA: Diagnosis not present

## 2020-05-13 DIAGNOSIS — Z7901 Long term (current) use of anticoagulants: Secondary | ICD-10-CM | POA: Diagnosis not present

## 2020-05-13 DIAGNOSIS — I10 Essential (primary) hypertension: Secondary | ICD-10-CM | POA: Diagnosis not present

## 2020-05-13 DIAGNOSIS — I4891 Unspecified atrial fibrillation: Secondary | ICD-10-CM | POA: Diagnosis not present

## 2020-05-13 DIAGNOSIS — Z20822 Contact with and (suspected) exposure to covid-19: Secondary | ICD-10-CM | POA: Diagnosis not present

## 2020-05-13 LAB — CBC
HCT: 42.7 % (ref 39.0–52.0)
Hemoglobin: 14.1 g/dL (ref 13.0–17.0)
MCH: 32 pg (ref 26.0–34.0)
MCHC: 33 g/dL (ref 30.0–36.0)
MCV: 97 fL (ref 80.0–100.0)
Platelets: 201 10*3/uL (ref 150–400)
RBC: 4.4 MIL/uL (ref 4.22–5.81)
RDW: 12.3 % (ref 11.5–15.5)
WBC: 19.4 10*3/uL — ABNORMAL HIGH (ref 4.0–10.5)
nRBC: 0 % (ref 0.0–0.2)

## 2020-05-13 LAB — BASIC METABOLIC PANEL
Anion gap: 8 (ref 5–15)
BUN: 16 mg/dL (ref 8–23)
CO2: 24 mmol/L (ref 22–32)
Calcium: 8.8 mg/dL — ABNORMAL LOW (ref 8.9–10.3)
Chloride: 102 mmol/L (ref 98–111)
Creatinine, Ser: 0.88 mg/dL (ref 0.61–1.24)
GFR, Estimated: >60 mL/min (ref >60)
Glucose, Bld: 112 mg/dL — ABNORMAL HIGH (ref 70–99)
Potassium: 4.8 mmol/L (ref 3.5–5.1)
Sodium: 134 mmol/L — ABNORMAL LOW (ref 135–145)

## 2020-05-13 MED ORDER — METHOCARBAMOL 500 MG PO TABS: 500.0000 mg | ORAL_TABLET | Freq: Four times a day (QID) | ORAL | 0 refills | Status: DC | PRN

## 2020-05-13 MED ORDER — HYDROCODONE-ACETAMINOPHEN 5-325 MG PO TABS: 1.0000 | ORAL_TABLET | Freq: Four times a day (QID) | ORAL | 0 refills | Status: DC | PRN

## 2020-05-13 MED ORDER — TRAMADOL HCL 50 MG PO TABS: 50.0000 mg | ORAL_TABLET | Freq: Four times a day (QID) | ORAL | 0 refills | Status: DC | PRN

## 2020-05-13 NOTE — TOC Progression Note (Addendum)
Transition of Care [facility]) - Progression Note   Patient Details  Name: [name] MRN: [id number] Date of Birth: 06-14-1929  Transition of Care [facility]) CM/SW [place], LCSW Phone Number: 05/13/2020, 3:17 PM  Clinical Narrative: CSW met with patient as his discharge plan has changed from a HEP to needing HHPT. CSW received verbal permission to refer patient out to [facility] agencies. CSW made referrals to the following agencies and patient was declined by all listed:  [facility]  CSW left VMs for Juliann Pulse in patient's PCP office ([place]) requesting assistance with [facility] referral due to 5 declines. CSW did not hear back from PCP's office. TOC to continue reaching out to [facility] agencies.  Addendum: CSW received call from Juliann Pulse from patient's PCP office. Per Juliann Pulse, patient would have to be seen in the office before they can refer him for [facility] services.  Expected Discharge Plan: [place] Barriers to Discharge: No [place] will accept this patient  Expected Discharge Plan and Services Expected Discharge Plan: [place] In-house Referral: Clinical Social Work Post Acute Care Choice: [place] arrangements for the past 2 months: Single Family Home Expected Discharge Date: 05/13/20               DME Arranged: N/A DME Agency: NA  Readmission Risk Interventions No flowsheet data found.

## 2020-05-13 NOTE — Plan of Care (Signed)
Plan of care reviewed and discussed with the patient. 

## 2020-05-13 NOTE — Telephone Encounter (Signed)
TC to [place] w/ Elvina Sidle, she has tried to place pt for [facility] PT with 5 different agencies and was wondering if we could tried to place a referral, explained that pt would need to be seen in order for [place] to place the referral and we at times have had to go through our list of agencies as well.

## 2020-05-13 NOTE — Progress Notes (Signed)
Physical Therapy Treatment Patient Details Name: [name] MRN: [id number] DOB: Nov 06, 1929 Today's Date: 05/13/2020    History of Present Illness 87 y.o. male admitted 05/11/20 for R AA-THA. PMH includes afib, HTN, skin cancer, L THA.    PT Comments    Pt making slow progress limited by pain and fatigue.  Ambulated 8' with RW and min A with mod A for transfers.  Pt needs further therapy - suspect will not be ready to return home safely today. Will see again in afternoon.    Follow Up Recommendations  Follow surgeon's recommendation for DC plan and follow-up therapies;Supervision/Assistance - 24 hour     Equipment Recommendations  None recommended by PT    Recommendations for Other Services       Precautions / Restrictions Precautions Precautions: Fall Precaution Comments: HOH Restrictions RLE Weight Bearing: Weight bearing as tolerated    Mobility  Bed Mobility Overal bed mobility: Needs Assistance Bed Mobility: Supine to Sit     Supine to sit: Mod assist;HOB elevated     General bed mobility comments: Pt requiring min A to slide legs to EOB and then mod A HHA to lift trunk    Transfers Overall transfer level: Needs assistance Equipment used: Rolling walker (2 wheeled) Transfers: Sit to/from Stand Sit to Stand: Min assist;From elevated surface         General transfer comment: Min A to rise from high surface with cues for hand placement  Ambulation/Gait Ambulation/Gait assistance: Min assist Gait Distance (Feet): 8 Feet Assistive device: Rolling walker (2 wheeled) Gait Pattern/deviations: Step-to pattern;Decreased stance time - right;Decreased weight shift to right;Antalgic     General Gait Details: Cues for sequencing and pressing into RW during R stance; mild buckling/flexing on R with stance due to pain; could not tolerate more due to pain; also recommend chair follow   Stairs             Wheelchair Mobility    Modified Rankin (Stroke  Patients Only)       Balance Overall balance assessment: Needs assistance Sitting-balance support: Feet supported;No upper extremity supported Sitting balance-Leahy Scale: Good     Standing balance support: Bilateral upper extremity supported Standing balance-Leahy Scale: Poor Standing balance comment: relies on BUE support                            Cognition Arousal/Alertness: Awake/alert Behavior During Therapy: WFL for tasks assessed/performed Overall Cognitive Status: Difficult to assess                                 General Comments: HOH making full assessment of cognition difficult.  Pt did make random comments at times and was beginning to fidget with pulse oximeter - RN is aware      Exercises Total Joint Exercises Ankle Circles/Pumps: AROM;Both;10 reps;Supine Quad Sets: AROM;Both;10 reps;Supine Heel Slides: AAROM;Right;10 reps;Supine Hip ABduction/ADduction: AAROM;Right;10 reps;Supine Long Arc Quad: AROM;Right;10 reps;Seated Other Exercises Other Exercises: Exercises only to tolerable level and with cues to limit compensation    General Comments General comments (skin integrity, edema, etc.): Educated on HEP, safe ice use, and elevation for edema      Pertinent Vitals/Pain Pain Assessment: Faces Faces Pain Scale: Hurts whole lot Pain Location: R hip with movement; minimal pain rest Pain Descriptors / Indicators: Grimacing;Guarding Pain Intervention(s): Limited activity within patient's tolerance;Monitored during session;Premedicated before session;Ice applied  Home Living                      Prior Function            PT Goals (current goals can now be found in the care plan section) Acute Rehab PT Goals Patient Stated Goal: back [place] PT Goal Formulation: With patient/family Time For Goal Achievement: 05/19/20 Potential to Achieve Goals: Good Progress towards PT goals: Progressing toward goals     Frequency    7X/week      PT Plan Current plan remains appropriate    Co-evaluation              AM-PAC PT "6 Clicks" Mobility   Outcome Measure  Help needed turning from your back to your side while in a flat bed without using bedrails?: A Little Help needed moving from lying on your back to sitting on the side of a flat bed without using bedrails?: A Lot Help needed moving to and from a bed to a chair (including a wheelchair)?: A Lot Help needed standing up from a chair using your arms (e.g., wheelchair or bedside chair)?: A Lot Help needed to walk in hospital room?: A Lot Help needed climbing 3-5 steps with a railing? : Total 6 Click Score: 12    End of Session Equipment Utilized During Treatment: Gait belt Activity Tolerance: Patient limited by pain;Patient limited by fatigue Patient left: with call bell/phone within reach;with chair alarm set;in chair Nurse Communication: Mobility status (possible increasing confusion; door open - across from nursing station) PT Visit Diagnosis: Difficulty in walking, not elsewhere classified (R26.2);Pain Pain - Right/Left: Right Pain - part of body: Hip     Time: 0932-1000 PT Time Calculation (min) (ACUTE ONLY): 28 min  Charges:  $Gait Training: 8-22 mins $Therapeutic Exercise: 8-22 mins                     Abran Richard, PT Acute Rehab Services Pager [phone] [name] [place] 05/13/2020, 10:18 AM

## 2020-05-13 NOTE — Progress Notes (Signed)
   Subjective: 2 Days Post-Op Procedure(s) (LRB): TOTAL HIP ARTHROPLASTY ANTERIOR APPROACH (Right) Patient reports pain as mild.   Plan is to go Home after hospital stay.  Objective: Vital signs in last 24 hours: Temp:  [97.7 F (36.5 C)-99.2 F (37.3 C)] 98.2 F (36.8 C) (04/08 0604) Pulse Rate:  [70-81] 76 (04/08 0604) Resp:  [16-18] 16 (04/08 0604) BP: (150-176)/(78-102) 159/102 (04/08 0604) SpO2:  [96 %-100 %] 100 % (04/08 0604)  Intake/Output from previous day:  Intake/Output Summary (Last 24 hours) at 05/13/2020 0644 Last data filed at 05/12/2020 1900 Gross per 24 hour  Intake 720 ml  Output 400 ml  Net 320 ml    Intake/Output this shift: No intake/output data recorded.  Labs: Recent Labs    05/11/20 1230 05/12/20 0310 05/13/20 0302  HGB 16.3 15.6 14.1   Recent Labs    05/12/20 0310 05/13/20 0302  WBC 17.9* 19.4*  RBC 4.85 4.40  HCT 46.9 42.7  PLT 282 201   Recent Labs    05/12/20 0310 05/13/20 0302  NA 134* 134*  K 5.0 4.8  CL 100 102  CO2 24 24  BUN 14 16  CREATININE 1.12 0.88  GLUCOSE 155* 112*  CALCIUM 8.9 8.8*   Recent Labs    05/11/20 1250  INR 1.1    EXAM General - Patient is Alert, Appropriate and Oriented Extremity - Neurologically intact Neurovascular intact No cellulitis present Compartment soft Dressing/Incision - clean, dry, no drainage Motor Function - intact, moving foot and toes well on exam.   Past Medical History:  Diagnosis Date  . Arthritis   . Atrial fibrillation ([place]) 04/28/2020  . Hyperlipidemia   . Hypertension   . Skin cancer    Face    Assessment/Plan: 2 Days Post-Op Procedure(s) (LRB): TOTAL HIP ARTHROPLASTY ANTERIOR APPROACH (Right) Principal Problem:   OA (osteoarthritis) of hip Active Problems:   Osteoarthritis of right hip   Up with therapy  Progressed slowly with PT yesterday. Will need significant improvement prior to safe discharge home Will order home PT Possible discharge later today  if he shows considerable improvement    [name] 05/13/2020, 6:44 AM

## 2020-05-13 NOTE — Plan of Care (Signed)
Plan of care discussed with pt.

## 2020-05-13 NOTE — Telephone Encounter (Signed)
Jinny Blossom called from [facility] stating that they are having a hard time getting pt Home Health care with the type of insurance he has. Wants to know if we can work with them on getting pt some home health care.   Can call Megan directly at [phone]

## 2020-05-13 NOTE — Progress Notes (Signed)
Physical Therapy Treatment Patient Details Name: [name] MRN: [id number] DOB: Feb 17, 1929 Today's Date: 05/13/2020    History of Present Illness 87 y.o. male admitted 05/11/20 for R anterior-THA. PMH includes afib, HTN, skin cancer, L THA.    PT Comments    Pt with similar PT session to am.  Remains limited by pain in R hip and needing mod A for transfers with min A for ambulation 8'.  From PT perspective, needs further therapy prior to return home.    Follow Up Recommendations  Follow surgeon's recommendation for DC plan and follow-up therapies;Supervision/Assistance - 24 hour (if not improving may need SNF)     Equipment Recommendations  None recommended by PT    Recommendations for Other Services       Precautions / Restrictions Precautions Precautions: Fall Precaution Comments: HOH    Mobility  Bed Mobility Overal bed mobility: Needs Assistance Bed Mobility: Sit to Supine       Sit to supine: Mod assist   General bed mobility comments: Required assist for R LE back to bed and HOB elevated    Transfers Overall transfer level: Needs assistance Equipment used: Rolling walker (2 wheeled) Transfers: Sit to/from Stand Sit to Stand: Mod assist         General transfer comment: Requiring mod A , cues for hand placement and R LE management, and cues for use of momentum and going on "3" to coordinate his efforts and PT's in lifting. Requiring increased assist from chair  Ambulation/Gait Ambulation/Gait assistance: Min assist Gait Distance (Feet): 8 Feet Assistive device: Rolling walker (2 wheeled) Gait Pattern/deviations: Step-to pattern;Decreased stance time - right;Decreased weight shift to right;Antalgic Gait velocity: decr   General Gait Details: Cues for sequencing and pressing into RW during R stance; mild buckling/flexing on R with stance due to pain; could not tolerate more due to pain; also recommend chair follow for further distance   Stairs              Wheelchair Mobility    Modified Rankin (Stroke Patients Only)       Balance Overall balance assessment: Needs assistance Sitting-balance support: Feet supported;No upper extremity supported Sitting balance-Leahy Scale: Good     Standing balance support: Bilateral upper extremity supported Standing balance-Leahy Scale: Poor Standing balance comment: relies on BUE support                            Cognition Arousal/Alertness: Awake/alert Behavior During Therapy: WFL for tasks assessed/performed Overall Cognitive Status: Difficult to assess                                 General Comments: HOH making full assessment of cognition difficult.  Pt did make random comments at times and was beginning to fidget with pulse oximeter - RN is aware; PT opened blinds to help maintain orientation.  Spoke with daughter who reports some of this is baseline (random topics and laughing)      Exercises Total Joint Exercises Ankle Circles/Pumps: AROM;Both;10 reps;Supine Quad Sets: AROM;Both;10 reps;Supine Heel Slides: AAROM;Right;Supine;5 reps Hip ABduction/ADduction: AAROM;Right;Supine;5 reps Long Arc Quad: AROM;Right;Seated;5 reps Other Exercises Other Exercises: LImited reps of exercises and increased assist to limit pain.  Only performing a few reps to loosen prior to transfers.    General Comments   Had request to notify daughter of progress.  Called and spoke with daughter and  updated on pt's transfer/gait ability limited by pain.  Did discuss concern for confusion but she reports the laughing and random topics of conversation can be his baseline.      Pertinent Vitals/Pain Pain Assessment: Faces Faces Pain Scale: Hurts whole lot Pain Location: R hip with movement; minimal pain rest Pain Descriptors / Indicators: Grimacing;Guarding Pain Intervention(s): Limited activity within patient's tolerance;Monitored during session;Premedicated before  session;Repositioned;Ice applied    Home Living                      Prior Function            PT Goals (current goals can now be found in the care plan section) Acute Rehab PT Goals Patient Stated Goal: back home to [place]; decrease pain PT Goal Formulation: With patient/family Time For Goal Achievement: 05/19/20 Potential to Achieve Goals: Good Progress towards PT goals: Progressing toward goals    Frequency    7X/week      PT Plan Current plan remains appropriate    Co-evaluation              AM-PAC PT "6 Clicks" Mobility   Outcome Measure  Help needed turning from your back to your side while in a flat bed without using bedrails?: A Little Help needed moving from lying on your back to sitting on the side of a flat bed without using bedrails?: A Lot Help needed moving to and from a bed to a chair (including a wheelchair)?: A Lot Help needed standing up from a chair using your arms (e.g., wheelchair or bedside chair)?: A Lot Help needed to walk in hospital room?: A Lot Help needed climbing 3-5 steps with a railing? : Total 6 Click Score: 12    End of Session Equipment Utilized During Treatment: Gait belt Activity Tolerance: Patient limited by pain;Patient limited by fatigue Patient left: with call bell/phone within reach;in bed;with bed alarm set Nurse Communication: Mobility status PT Visit Diagnosis: Difficulty in walking, not elsewhere classified (R26.2);Pain Pain - Right/Left: Right Pain - part of body: Hip     Time: 1340-1403 PT Time Calculation (min) (ACUTE ONLY): 23 min  Charges:  $Gait Training: 8-22 mins $Therapeutic Exercise: 8-22 mins                     Abran Richard, PT Acute Rehab Services Pager [phone] [name] [place] 05/13/2020, 2:45 PM

## 2020-05-14 DIAGNOSIS — Z7901 Long term (current) use of anticoagulants: Secondary | ICD-10-CM | POA: Diagnosis not present

## 2020-05-14 DIAGNOSIS — Z79899 Other long term (current) drug therapy: Secondary | ICD-10-CM | POA: Diagnosis not present

## 2020-05-14 DIAGNOSIS — Z20822 Contact with and (suspected) exposure to covid-19: Secondary | ICD-10-CM | POA: Diagnosis not present

## 2020-05-14 DIAGNOSIS — I4891 Unspecified atrial fibrillation: Secondary | ICD-10-CM | POA: Diagnosis not present

## 2020-05-14 DIAGNOSIS — Z85828 Personal history of other malignant neoplasm of skin: Secondary | ICD-10-CM | POA: Diagnosis not present

## 2020-05-14 DIAGNOSIS — I10 Essential (primary) hypertension: Secondary | ICD-10-CM | POA: Diagnosis not present

## 2020-05-14 DIAGNOSIS — M1611 Unilateral primary osteoarthritis, right hip: Secondary | ICD-10-CM | POA: Diagnosis not present

## 2020-05-14 LAB — CBC
HCT: 39.7 % (ref 39.0–52.0)
Hemoglobin: 13.1 g/dL (ref 13.0–17.0)
MCH: 32 pg (ref 26.0–34.0)
MCHC: 33 g/dL (ref 30.0–36.0)
MCV: 97.1 fL (ref 80.0–100.0)
Platelets: 195 10*3/uL (ref 150–400)
RBC: 4.09 MIL/uL — ABNORMAL LOW (ref 4.22–5.81)
RDW: 12.2 % (ref 11.5–15.5)
WBC: 12.3 10*3/uL — ABNORMAL HIGH (ref 4.0–10.5)
nRBC: 0 % (ref 0.0–0.2)

## 2020-05-14 NOTE — Plan of Care (Signed)
  Problem: Education: Goal: Knowledge of General Education information will improve Description: Including pain rating scale, medication(s)/side effects and non-pharmacologic comfort measures Outcome: Progressing   Problem: Activity: Goal: Risk for activity intolerance will decrease Outcome: Progressing   Problem: Pain Managment: Goal: General experience of comfort will improve Outcome: Progressing   

## 2020-05-14 NOTE — Plan of Care (Signed)
  Problem: Pain Managment: Goal: General experience of comfort will improve Outcome: Progressing   

## 2020-05-14 NOTE — Progress Notes (Signed)
Physical Therapy Treatment Patient Details Name: [name] MRN: [id number] DOB: [birth year] Today's Date: 05/14/2020    History of Present Illness 87 y.o. male admitted 05/11/20 for R anterior-THA. PMH includes afib, HTN, skin cancer, L THA.    PT Comments    Pt still in pain today with movement " very sore" and requires assist for movement as well. Pt gets very fatigues with little ambulation and "sweaty" diaphoretic.  Ambulation still limited to about 15 feet , followed by chair and lots of effort and "sweaty". I spoke with the dtr more by phone and learned more about what family can assist and not at home. Pt is home alone and son lives near by and can check on patient periodically, but pt will need to be independent with basiccare and mobility to toilet and get drink and water when alone.   At this point, pt would not be safe or able to carryout basic mobility to be home alone ( with only PRN assist) so recommending ST-SNF if able and if patient does not progress more. We will continue to see him while here each day.     Follow Up Recommendations  SNF     Equipment Recommendations  None recommended by PT    Recommendations for Other Services       Precautions / Restrictions Precautions Precautions: Fall Precaution Comments: HOH Restrictions RLE Weight Bearing: Weight bearing as tolerated    Mobility  Bed Mobility Overal bed mobility: Needs Assistance Bed Mobility: Sit to Supine     Supine to sit: Mod assist;HOB elevated     General bed mobility comments: assist with LE and upper body    Transfers Overall transfer level: Needs assistance Equipment used: Rolling walker (2 wheeled) Transfers: Sit to/from Stand Sit to Stand: Mod assist         General transfer comment: cues for hand placment and safety and to boost from sitting.  Ambulation/Gait Ambulation/Gait assistance: Min assist;+2 safety/equipment Gait Distance (Feet): 15 Feet Assistive device: Rolling  walker (2 wheeled) Gait Pattern/deviations: Step-to pattern;Decreased stance time - right;Decreased weight shift to right;Antalgic Gait velocity: decr   General Gait Details: Cues for sequencing and pressing into RW during R stance; mild buckling/flexing on R with stance due to pain; could not tolerate more due to pain; followed by chair and fatigued very quickly during session, began to feel sweaty and diaphoretic   Stairs             Wheelchair Mobility    Modified Rankin (Stroke Patients Only)       Balance Overall balance assessment: Needs assistance Sitting-balance support: Feet supported;No upper extremity supported Sitting balance-Leahy Scale: Good     Standing balance support: Bilateral upper extremity supported Standing balance-Leahy Scale: Poor Standing balance comment: relies on BUE support                            Cognition Arousal/Alertness: Awake/alert Behavior During Therapy: WFL for tasks assessed/performed Overall Cognitive Status: Difficult to assess                                 General Comments: difficult due to [facility]      Exercises Total Joint Exercises Ankle Circles/Pumps: AROM;Both;10 reps;Supine Quad Sets: AROM;Both;10 reps;Supine Heel Slides: AAROM;Right;Supine;5 reps Hip ABduction/ADduction: AAROM;Right;Supine;5 reps Straight Leg Raises: AAROM;Supine;Right;5 reps    General Comments  Pertinent Vitals/Pain Pain Assessment: No/denies pain Pain Score: 7  Pain Location: R hip is so sore today , all over Pain Descriptors / Indicators: Aching;Sore Pain Intervention(s): Limited activity within patient's tolerance;Monitored during session;Ice applied    Home Living                      Prior Function            PT Goals (current goals can now be found in the care plan section) Acute Rehab PT Goals Patient Stated Goal: I think I will be able to get some help PT Goal Formulation: With  patient/family Time For Goal Achievement: 05/19/20 Potential to Achieve Goals: Good Progress towards PT goals: Progressing toward goals    Frequency    7X/week      PT Plan Discharge plan needs to be updated    Co-evaluation              AM-PAC PT "6 Clicks" Mobility   Outcome Measure  Help needed turning from your back to your side while in a flat bed without using bedrails?: A Little Help needed moving from lying on your back to sitting on the side of a flat bed without using bedrails?: A Lot Help needed moving to and from a bed to a chair (including a wheelchair)?: A Lot Help needed standing up from a chair using your arms (e.g., wheelchair or bedside chair)?: A Lot Help needed to walk in hospital room?: A Lot Help needed climbing 3-5 steps with a railing? : A Lot 6 Click Score: 13    End of Session Equipment Utilized During Treatment: Gait belt Activity Tolerance: Patient limited by pain;Patient limited by fatigue Patient left: with call bell/phone within reach;in bed;with bed alarm set Nurse Communication: Mobility status PT Visit Diagnosis: Difficulty in walking, not elsewhere classified (R26.2);Pain Pain - Right/Left: Right Pain - part of body: Hip     Time: 1020-1049 PT Time Calculation (min) (ACUTE ONLY): 29 min  Charges:  $Gait Training: 8-22 mins $Therapeutic Exercise: 8-22 mins                     Enes Wegener, PT, MPT Acute Rehabilitation Services Office: [phone] Pager: [phone] 05/14/2020    Precious Gilchrest, Gatha Mayer 05/14/2020, 2:01 PM

## 2020-05-14 NOTE — TOC Progression Note (Signed)
Transition of Care [facility]) - Progression Note    Patient Details  Name: Mical Kicklighter MRN: [id number] Date of Birth: [birth year]  Transition of Care [facility]) CM/SW Contact  Joaquin Courts, RN Phone Number: 05/14/2020, 10:40 AM  Clinical Narrative:    [facility] to provide HHPT, rep Ronalee Belts confirms.  Patient reports has rw and 3in1 at home.   Expected Discharge Plan: [place] Barriers to Discharge: No [place] will accept this patient  Expected Discharge Plan and Services Expected Discharge Plan: [place] In-house Referral: Clinical Social Work   Post Acute Care Choice: [place] arrangements for the past 2 months: [place] Expected Discharge Date: 05/13/20               DME Arranged: N/A DME Agency: NA       HH Arranged: PT [place] Agency: [facility] [employer] Date [place]: 05/14/20 Time [place]: [place] Representative spoke with at [place]: [place] ([place]) Interventions    Readmission Risk Interventions No flowsheet data found.

## 2020-05-14 NOTE — TOC Progression Note (Signed)
Transition of Care [facility]) - Progression Note    Patient Details  Name: [name] MRN: [id number] Date of Birth: [birth year]  Transition of Care [facility]) CM/SW Contact  Joaquin Courts, RN Phone Number: 05/14/2020, 3:51 PM  Clinical Narrative:    CM received notification that patient is progressing slowly with therapy and way need SNF for short term rehab.  CM spoke with PA for physician on-call who is in agreement with this plan.  CM spoke with patient regarding this recommendation, patient is not too excited about this development and shares that he feels his son who is close by can help him around the home while he recovers though patient does not sound definitive about this.  CM obtain permission to speak with son to confirm this. Son states he will discuss things with patient, he feels patient may benefit most from SNF, based on the level of care he will need.  TOC to follow up in AM regarding final plan from patient/son.     Expected Discharge Plan: [place] Barriers to Discharge: No [place] will accept this patient  Expected Discharge Plan and Services Expected Discharge Plan: [place] In-house Referral: Clinical Social Work   Post Acute Care Choice: [place] arrangements for the past 2 months: [place] Expected Discharge Date: 05/13/20               DME Arranged: N/A DME Agency: NA       HH Arranged: PT [place] Agency: [facility] [employer] Date [place]: 05/14/20 Time [place]: [place] Representative spoke with at [place]: [place] ([place]) Interventions    Readmission Risk Interventions No flowsheet data found.

## 2020-05-14 NOTE — Progress Notes (Signed)
   05/14/20 1736  PT Visit Information  Pt had been up in recliner and walked to bathroom with nursin and now back in bed with considerable amount of pain in R LE. He really did not want to get up again and awalk this afternoon, so we focused on bed exercises and this helped with his pain as well. Will continue to work on mobility and exercises for patient's progression with ultimate goal to return home ASAP.    Last PT Received On 05/14/20  History of Present Illness 87 y.o. male admitted 05/11/20 for R anterior-THA. PMH includes afib, HTN, skin cancer, L THA.  Subjective Data  Patient Stated Goal I think I will be able to get some help  Precautions  Precautions Fall  Precaution Comments HOH  Pain Assessment  Pain Assessment 0-10  Pain Score 6  Pain Location R hip is so sore today , all over  Pain Descriptors / Indicators Aching;Sore  Pain Intervention(s) Limited activity within patient's tolerance;Patient requesting pain meds-RN notified  Cognition  Arousal/Alertness Awake/alert  Behavior During Therapy WFL for tasks assessed/performed  Overall Cognitive Status Difficult to assess  General Comments difficult due to [facility]  Difficult to assess due to Hard of hearing/deaf  Total Joint Exercises  Ankle Circles/Pumps AROM;Both;10 reps;Supine  Quad Sets AROM;Both;10 reps;Supine  Heel Slides AAROM;Right;Supine;5 reps  Hip ABduction/ADduction AAROM;Right;Supine;5 reps  Straight Leg Raises AAROM;Supine;Right;5 reps  PT Time Calculation  PT Start Time (ACUTE ONLY) 1620  PT Stop Time (ACUTE ONLY) 1636  PT Time Calculation (min) (ACUTE ONLY) 16 min  PT General Charges  $$ ACUTE PT VISIT 1 Visit  PT Treatments  $Therapeutic Exercise 8-22 mins   Sylvia Kondracki, PT, MPT Acute Rehabilitation Services Office: [phone] Pager: [phone] 05/14/2020

## 2020-05-14 NOTE — Progress Notes (Signed)
   Subjective: 3 Days Post-Op Procedure(s) (LRB): TOTAL HIP ARTHROPLASTY ANTERIOR APPROACH (Right) Patient reports pain as mild.   Plan is to go Home after hospital stay.  Objective: Vital signs in last 24 hours: Temp:  [98.2 F (36.8 C)-99.4 F (37.4 C)] 98.4 F (36.9 C) (04/09 0602) Pulse Rate:  [63-84] 63 (04/09 0602) Resp:  [16-19] 16 (04/09 0602) BP: (114-143)/(67-98) 143/67 (04/09 0602) SpO2:  [95 %-97 %] 95 % (04/09 0602)  Intake/Output from previous day:  Intake/Output Summary (Last 24 hours) at 05/14/2020 0832 Last data filed at 05/14/2020 0602 Gross per 24 hour  Intake 320 ml  Output 250 ml  Net 70 ml    Intake/Output this shift: No intake/output data recorded.  Labs: Recent Labs    05/11/20 1230 05/12/20 0310 05/13/20 0302 05/14/20 0256  HGB 16.3 15.6 14.1 13.1   Recent Labs    05/13/20 0302 05/14/20 0256  WBC 19.4* 12.3*  RBC 4.40 4.09*  HCT 42.7 39.7  PLT 201 195   Recent Labs    05/12/20 0310 05/13/20 0302  NA 134* 134*  K 5.0 4.8  CL 100 102  CO2 24 24  BUN 14 16  CREATININE 1.12 0.88  GLUCOSE 155* 112*  CALCIUM 8.9 8.8*   Recent Labs    05/11/20 1250  INR 1.1    EXAM General - Patient is Alert, Appropriate and Oriented Extremity - Neurologically intact Neurovascular intact No cellulitis present Compartment soft Dressing/Incision - clean, dry, no drainage, scant one quarter sized area of bloody drainage in the proximal aspect of the incision Motor Function - intact, moving foot and toes well on exam.   Past Medical History:  Diagnosis Date  . Arthritis   . Atrial fibrillation ([place]) 04/28/2020  . Hyperlipidemia   . Hypertension   . Skin cancer    Face    Assessment/Plan: 3 Days Post-Op Procedure(s) (LRB): TOTAL HIP ARTHROPLASTY ANTERIOR APPROACH (Right) Principal Problem:   OA (osteoarthritis) of hip Active Problems:   Osteoarthritis of right hip   Up with therapy  Progressed slowly with PT but certainly doing  better.  At this time, waiting on the home health agency that can accommodate his need at discharge.    Given current status today with poor performance in therapy and need for good disposition I would anticipate he will need to stay another night.    Nicholes Stairs 05/14/2020, 8:32 AM

## 2020-05-15 DIAGNOSIS — I4891 Unspecified atrial fibrillation: Secondary | ICD-10-CM | POA: Diagnosis not present

## 2020-05-15 DIAGNOSIS — Z7901 Long term (current) use of anticoagulants: Secondary | ICD-10-CM | POA: Diagnosis not present

## 2020-05-15 DIAGNOSIS — Z85828 Personal history of other malignant neoplasm of skin: Secondary | ICD-10-CM | POA: Diagnosis not present

## 2020-05-15 DIAGNOSIS — Z20822 Contact with and (suspected) exposure to covid-19: Secondary | ICD-10-CM | POA: Diagnosis not present

## 2020-05-15 DIAGNOSIS — Z79899 Other long term (current) drug therapy: Secondary | ICD-10-CM | POA: Diagnosis not present

## 2020-05-15 DIAGNOSIS — M1611 Unilateral primary osteoarthritis, right hip: Secondary | ICD-10-CM | POA: Diagnosis not present

## 2020-05-15 DIAGNOSIS — I10 Essential (primary) hypertension: Secondary | ICD-10-CM | POA: Diagnosis not present

## 2020-05-15 NOTE — Progress Notes (Signed)
Physical Therapy Treatment Patient Details Name: [name] MRN: [id number] DOB: 11-30-1929 Today's Date: 05/15/2020    History of Present Illness 87 y.o. male admitted 05/11/20 for R anterior-THA. PMH includes afib, HTN, skin cancer, L THA.    PT Comments    Pt agreeable to therapy this morning and reported pain was more controlled today. Pt was able to complete LE there ex with min assist. Pt min assist with bed mobility. Pt ambulated 66ft min assist. Pt lives alone and can not return safely at this time. Pt is limited by fatigue and weakness. Continue to recommend SNF placement for continued rehab prior to d/c back home. Pt will continue to benefit from acute skilled PT to maximize mobility and independence.   Follow Up Recommendations  SNF     Equipment Recommendations  None recommended by PT    Recommendations for Other Services       Precautions / Restrictions Precautions Precautions: Fall;Anterior Hip Precaution Comments: HOH Restrictions Weight Bearing Restrictions: No RLE Weight Bearing: Weight bearing as tolerated    Mobility  Bed Mobility Overal bed mobility: Needs Assistance Bed Mobility: Supine to Sit     Supine to sit: Min assist;HOB elevated     General bed mobility comments: assistance with technique, use of bed rails, therapist provided truck support for initial rise up    Transfers Overall transfer level: Needs assistance Equipment used: Rolling walker (2 wheeled)   Sit to Stand: Min assist;From elevated surface         General transfer comment: cues for hand placment and safety and to boost from sitting.  Ambulation/Gait Ambulation/Gait assistance: Min assist Gait Distance (Feet): 20 Feet Assistive device: Rolling walker (2 wheeled) Gait Pattern/deviations: Step-to pattern;Decreased stride length;Decreased weight shift to right;Decreased step length - left Gait velocity: decr   General Gait Details: fatigues quickly, left LE weakness  noted with increaseing gait distance   Stairs             Wheelchair Mobility    Modified Rankin (Stroke Patients Only)       Balance Overall balance assessment: Needs assistance Sitting-balance support: Feet supported;No upper extremity supported Sitting balance-Leahy Scale: Good     Standing balance support: Bilateral upper extremity supported Standing balance-Leahy Scale: Poor Standing balance comment: relies on BUE support                            Cognition Arousal/Alertness: Awake/alert Behavior During Therapy: WFL for tasks assessed/performed Overall Cognitive Status: Within Functional Limits for tasks assessed                                        Exercises Total Joint Exercises Ankle Circles/Pumps: AROM;Both;10 reps;Supine Quad Sets: AROM;Both;10 reps;Supine Heel Slides: AAROM;Right;10 reps;Supine;Strengthening Hip ABduction/ADduction: AAROM;Strengthening;Right;10 reps;Supine Long Arc Quad: AAROM;Strengthening;Right;10 reps;Supine    General Comments        Pertinent Vitals/Pain Pain Assessment: Faces Faces Pain Scale: Hurts little more Pain Location: pain with activity Pain Descriptors / Indicators: Discomfort;Guarding Pain Intervention(s): Limited activity within patient's tolerance;Repositioned;Monitored during session    Home Living                      Prior Function            PT Goals (current goals can now be found in the care plan section) Progress  towards PT goals: Progressing toward goals    Frequency    7X/week      PT Plan Discharge plan needs to be updated    Co-evaluation              AM-PAC PT "6 Clicks" Mobility   Outcome Measure    Help needed moving from lying on your back to sitting on the side of a flat bed without using bedrails?: A Lot Help needed moving to and from a bed to a chair (including a wheelchair)?: A Little Help needed standing up from a chair using  your arms (e.g., wheelchair or bedside chair)?: A Little Help needed to walk in hospital room?: A Little Help needed climbing 3-5 steps with a railing? : A Lot 6 Click Score: 13    End of Session Equipment Utilized During Treatment: Gait belt Activity Tolerance: Patient limited by pain;Patient limited by fatigue Patient left: with call bell/phone within reach;in bed;with chair alarm set Nurse Communication: Mobility status PT Visit Diagnosis: Difficulty in walking, not elsewhere classified (R26.2);Pain Pain - Right/Left: Right Pain - part of body: Hip     Time: 2683-4196 PT Time Calculation (min) (ACUTE ONLY): 24 min  Charges:  $Gait Training: 8-22 mins $Therapeutic Exercise: 8-22 mins                      Lelon Mast 05/15/2020, 9:07 AM

## 2020-05-15 NOTE — Plan of Care (Signed)
  Problem: Clinical Measurements: Goal: Respiratory complications will improve Outcome: Progressing   Problem: Clinical Measurements: Goal: Cardiovascular complication will be avoided Outcome: Progressing   Problem: Elimination: Goal: Will not experience complications related to bowel motility Outcome: Progressing   Problem: Pain Managment: Goal: General experience of comfort will improve Outcome: Progressing   Problem: Education: Goal: Understanding of discharge needs will improve Outcome: Progressing   Problem: Activity: Goal: Ability to avoid complications of mobility impairment will improve Outcome: Progressing   Problem: Skin Integrity: Goal: Will show signs of wound healing Outcome: Progressing

## 2020-05-15 NOTE — NC FL2 (Addendum)
[place] LEVEL OF CARE SCREENING TOOL     IDENTIFICATION  Patient Name: [name] Birthdate: December 31, 1929 Sex: male Admission Date (Current Location): 05/11/2020  [facility] and [place] Number:  Herbalist and Address:  [facility],  [place] [address], [place]      Provider Number: [id number]  Attending Physician Name and Address:  [name], MD  Relative Name and Phone Number:  Alvino, [place] [phone]    Current Level of Care: Hospital Recommended Level of Care: [place] Prior Approval Number:    Date Approved/Denied: 05/15/20 PASRR Number: [id number]  Discharge Plan: SNF    Current Diagnoses: Patient Active Problem List   Diagnosis Date Noted  . OA (osteoarthritis) of hip 05/11/2020  . Osteoarthritis of right hip 05/11/2020  . Preoperative clearance 05/04/2020  . Atrial fibrillation ([place]) 04/28/2020  . Venous stasis of lower extremity 10/23/2019  . Obesity (BMI 30.0-34.9) 07/18/2017  . Essential hypertension 08/16/2014  . Hyperlipemia 08/16/2014  . Peripheral edema 06/10/2012  . Vitamin D deficiency 05/10/2010  . Arthritis 05/10/2010    Orientation RESPIRATION BLADDER Height & Weight     Self,Time,Situation,Place  Normal Continent Weight: 246 lb 0.5 oz (111.6 kg) Height:  6\' 1"  (185.4 cm)  BEHAVIORAL SYMPTOMS/MOOD NEUROLOGICAL BOWEL NUTRITION STATUS      Continent Diet  AMBULATORY STATUS COMMUNICATION OF NEEDS Skin   Limited Assist Verbally Normal                       Personal Care Assistance Level of Assistance  Bathing,Feeding,Dressing,Total care Bathing Assistance: Independent Feeding assistance: Independent Dressing Assistance: Independent Total Care Assistance: Limited assistance   Functional Limitations Info  Sight,Speech,Hearing Sight Info: Adequate Hearing Info: Adequate Speech Info: Adequate    SPECIAL CARE FACTORS FREQUENCY       PT 7X per week                 Contractures Contractures Info: Not present    Additional Factors Info                  Current Medications (05/15/2020):  This is the current hospital active medication list Current Facility-Administered Medications  Medication Dose Route Frequency Provider Last Rate Last Admin  . 0.9 %  sodium chloride infusion   Intravenous Continuous Jonnie Kind, PA-C 75 mL/hr at 05/11/20 2008 New Bag at 05/11/20 2008  . acetaminophen (TYLENOL) tablet 325-650 mg  325-650 mg Oral Q6H PRN Fenton Foy D, PA-C      . apixaban Arne [place]) tablet 2.5 mg  2.5 mg Oral Q12H Fenton Foy D, PA-C   2.5 mg at 05/15/20 0815  . bisacodyl (DULCOLAX) suppository 10 mg  10 mg Rectal Daily PRN Fenton Foy D, PA-C      . docusate sodium (COLACE) capsule 100 mg  100 mg Oral BID Fenton Foy D, PA-C   100 mg at 05/15/20 5035  . furosemide (LASIX) tablet 20 mg  20 mg Oral Daily PRN Jonnie Kind, PA-C      . HYDROcodone-acetaminophen (NORCO/VICODIN) 5-325 MG per tablet 1-2 tablet  1-2 tablet Oral Q4H PRN Fenton Foy D, PA-C   1 tablet at 05/14/20 1655  . magnesium citrate solution 1 Bottle  1 Bottle Oral Once PRN Fenton Foy D, PA-C      . menthol-cetylpyridinium (CEPACOL) lozenge 3 mg  1 lozenge Oral PRN Fenton Foy D, PA-C       Or  .  phenol (CHLORASEPTIC) mouth spray 1 spray  1 spray Mouth/Throat PRN Fenton Foy D, PA-C      . methocarbamol (ROBAXIN) tablet 500 mg  500 mg Oral Q6H PRN Fenton Foy D, PA-C   500 mg at 05/15/20 6503   Or  . methocarbamol (ROBAXIN) 500 mg in dextrose 5 % 50 mL IVPB  500 mg Intravenous Q6H PRN Roda Shutters, Sean D, PA-C      . metoCLOPramide (REGLAN) tablet 5-10 mg  5-10 mg Oral Q8H PRN Fenton Foy D, PA-C       Or  . metoCLOPramide (REGLAN) injection 5-10 mg  5-10 mg Intravenous Q8H PRN Roda Shutters, Sean D, PA-C      . morphine 2 MG/ML injection 0.5-1 mg  0.5-1 mg Intravenous Q2H PRN Fenton Foy D, PA-C      . ondansetron [facility])  tablet 4 mg  4 mg Oral Q6H PRN Fenton Foy D, PA-C       Or  . ondansetron [facility]) injection 4 mg  4 mg Intravenous Q6H PRN Fenton Foy D, PA-C      . polyethylene glycol (MIRALAX / GLYCOLAX) packet 17 g  17 g Oral Daily PRN Fenton Foy D, PA-C   17 g at 05/15/20 0819  . rosuvastatin (CRESTOR) tablet 2.5 mg  2.5 mg Oral Q M,W,F Fenton Foy D, PA-C   2.5 mg at 05/13/20 5465  . traMADol (ULTRAM) tablet 50-100 mg  50-100 mg Oral Q6H PRN Jonnie Kind, PA-C   50 mg at 05/15/20 6812     Discharge Medications: Please see discharge summary for a list of discharge medications.  Relevant Imaging Results:  Relevant Lab Results:   Additional Information SS# [id number]  Adelene Amas, LCSWA

## 2020-05-15 NOTE — TOC Progression Note (Addendum)
Transition of Care [facility]) - Progression Note    Patient Details  Name: [name] MRN: [id number] Date of Birth: May 30, 1929  Transition of Care [facility]) CM/SW [place], [place] Phone Number:  [phone] 05/15/2020, 3:04 PM  Clinical Narrative:     CSW spoke with patient's son [name] [phone] who stated he spoke with the patient and the patient agreed to SNF placement.  CSW explained SNF placement process and estimated timeline and Medicare.gov information.  Mr. Hitz stated he would prefer for SNF placement closer to [facility]. CSW stated I could include SNFs in that area but that I could not promise specific placement and that I would include SNFs in the [place] area as well.  Mr. Albus verbalized understanding.  FL2 sent for signature, PASRR [id number], SNF search started.  Expected Discharge Plan: [place] Barriers to Discharge: No [facility] will accept this patient  Expected Discharge Plan and Services Expected Discharge Plan: [place] In-house Referral: Clinical Social Work   Post Acute Care Choice: [place] arrangements for the past 2 months: [place] Expected Discharge Date: 05/13/20               DME Arranged: N/A DME Agency: NA       HH Arranged: PT [place] Agency: [facility] [employer]) Date [place]: 05/14/20 Time [place]: [place] Representative spoke with at [place] at [place]: [place] ([facility]) Interventions    Readmission Risk Interventions No flowsheet data found.

## 2020-05-15 NOTE — Progress Notes (Signed)
Subjective: 4 Days Post-Op Procedure(s) (LRB): TOTAL HIP ARTHROPLASTY ANTERIOR APPROACH (Right) Patient reports pain as mild.   No events over night. Denies CP, SOB, calf pain, fevers/sweats/chills.   Objective: Vital signs in last 24 hours: Temp:  [97.5 F (36.4 C)-98.2 F (36.8 C)] 97.5 F (36.4 C) (04/10 1409) Pulse Rate:  [65-80] 65 (04/10 1409) Resp:  [16-20] 17 (04/10 1409) BP: (123-158)/(78-97) 158/97 (04/10 1409) SpO2:  [98 %-100 %] 98 % (04/10 1409)  Intake/Output from previous day: 04/09 0701 - 04/10 0700 In: 1177.5 [P.O.:540; I.V.:637.5] Out: 300 [Urine:300] Intake/Output this shift: Total I/O In: 120 [P.O.:120] Out: 200 [Urine:200]  Recent Labs    05/13/20 0302 05/14/20 0256  HGB 14.1 13.1   Recent Labs    05/13/20 0302 05/14/20 0256  WBC 19.4* 12.3*  RBC 4.40 4.09*  HCT 42.7 39.7  PLT 201 195   Recent Labs    05/13/20 0302  NA 134*  K 4.8  CL 102  CO2 24  BUN 16  CREATININE 0.88  GLUCOSE 112*  CALCIUM 8.8*   No results for input(s): LABPT, INR in the last 72 hours.  Neurologically intact ABD soft Neurovascular intact Sensation intact distally Intact pulses distally Dorsiflexion/Plantar flexion intact Incision: scant drainage Quarter size Compartments soft and non-tender.    Assessment/Plan: 4 Days Post-Op Procedure(s) (LRB): TOTAL HIP ARTHROPLASTY ANTERIOR APPROACH (Right) Advance diet Up with therapy  At this time, waiting on the home health agency that can accommodate his need at discharge.      [name] 05/15/2020, 2:22 PM

## 2020-05-16 DIAGNOSIS — I10 Essential (primary) hypertension: Secondary | ICD-10-CM | POA: Diagnosis not present

## 2020-05-16 DIAGNOSIS — Z79899 Other long term (current) drug therapy: Secondary | ICD-10-CM | POA: Diagnosis not present

## 2020-05-16 DIAGNOSIS — Z7901 Long term (current) use of anticoagulants: Secondary | ICD-10-CM | POA: Diagnosis not present

## 2020-05-16 DIAGNOSIS — Z20822 Contact with and (suspected) exposure to covid-19: Secondary | ICD-10-CM | POA: Diagnosis not present

## 2020-05-16 DIAGNOSIS — M1611 Unilateral primary osteoarthritis, right hip: Secondary | ICD-10-CM | POA: Diagnosis not present

## 2020-05-16 DIAGNOSIS — Z85828 Personal history of other malignant neoplasm of skin: Secondary | ICD-10-CM | POA: Diagnosis not present

## 2020-05-16 DIAGNOSIS — I4891 Unspecified atrial fibrillation: Secondary | ICD-10-CM | POA: Diagnosis not present

## 2020-05-16 LAB — SARS CORONAVIRUS 2 (TAT 6-24 HRS): SARS Coronavirus 2: NEGATIVE

## 2020-05-16 NOTE — Plan of Care (Signed)
  Problem: Education: Goal: Knowledge of General Education information will improve Description Including pain rating scale, medication(s)/side effects and non-pharmacologic comfort measures Outcome: Progressing   

## 2020-05-16 NOTE — Plan of Care (Signed)
  Problem: Pain Management: Goal: Pain level will decrease with appropriate interventions Outcome: Progressing   

## 2020-05-16 NOTE — Progress Notes (Signed)
Physical Therapy Treatment Patient Details Name: [name] MRN: [id number] DOB: [birth year] Today's Date: 05/16/2020    History of Present Illness 87 y.o. male admitted 05/11/20 for R anterior-THA. PMH includes afib, HTN, skin cancer, L THA.    PT Comments    Continues to progress slowly, fatigues quickly with short distance amb. Pt would benefit from SNF post acute if MD agreeable. Will need 24hr assist if d/cs home with HHPT  Follow Up Recommendations  Follow surgeon's recommendation for DC plan and follow-up therapies (would benefit from SNF, will need 24 hr assist if d/c's home)     Equipment Recommendations  None recommended by PT    Recommendations for Other Services       Precautions / Restrictions Precautions Precautions: Fall Restrictions RLE Weight Bearing: Weight bearing as tolerated    Mobility  Bed Mobility Overal bed mobility: Needs Assistance Bed Mobility: Supine to Sit     Supine to sit: Min assist;HOB elevated     General bed mobility comments: incr time, assist with trunk, pt instructed to use gait belt to assist with RLE off bed    Transfers Overall transfer level: Needs assistance Equipment used: Rolling walker (2 wheeled) Transfers: Sit to/from Stand Sit to Stand: Min guard;From elevated surface         General transfer comment: cues for hand placement  Ambulation/Gait Ambulation/Gait assistance: Min assist Gait Distance (Feet): 22 Feet Assistive device: Rolling walker (2 wheeled) Gait Pattern/deviations: Step-to pattern;Decreased stride length;Decreased weight shift to right;Decreased step length - left;Antalgic     General Gait Details: cues for sequence, use of UEs, incr time needed and pt fatigues easily   Stairs             Wheelchair Mobility    Modified Rankin (Stroke Patients Only)       Balance   Sitting-balance support: Feet supported;No upper extremity supported Sitting balance-Leahy Scale: Good      Standing balance support: Bilateral upper extremity supported Standing balance-Leahy Scale: Poor Standing balance comment: relies on BUE support                            Cognition Arousal/Alertness: Awake/alert Behavior During Therapy: WFL for tasks assessed/performed Overall Cognitive Status: Within Functional Limits for tasks assessed                                        Exercises Total Joint Exercises Ankle Circles/Pumps: AROM;Both;10 reps;Supine    General Comments        Pertinent Vitals/Pain Pain Assessment: 0-10 Pain Score: 10-Worst pain ever Pain Location: right hip Pain Descriptors / Indicators: Discomfort;Guarding Pain Intervention(s): Limited activity within patient's tolerance;Monitored during session;Premedicated before session;Repositioned;Patient requesting pain meds-RN notified;RN gave pain meds during session    Home Living                      Prior Function            PT Goals (current goals can now be found in the care plan section) Acute Rehab PT Goals Patient Stated Goal: I think I will be able to get some help PT Goal Formulation: With patient/family Time For Goal Achievement: 05/19/20 Potential to Achieve Goals: Good Progress towards PT goals: Progressing toward goals    Frequency    7X/week      PT  Plan Current plan remains appropriate    Co-evaluation              AM-PAC PT "6 Clicks" Mobility   Outcome Measure  Help needed turning from your back to your side while in a flat bed without using bedrails?: A Little Help needed moving from lying on your back to sitting on the side of a flat bed without using bedrails?: A Little Help needed moving to and from a bed to a chair (including a wheelchair)?: A Little Help needed standing up from a chair using your arms (e.g., wheelchair or bedside chair)?: A Little Help needed to walk in hospital room?: A Little Help needed climbing 3-5 steps  with a railing? : A Lot 6 Click Score: 17    End of Session Equipment Utilized During Treatment: Gait belt Activity Tolerance: Patient limited by fatigue;Patient limited by pain Patient left: with call bell/phone within reach;in bed;with chair alarm set Nurse Communication: Mobility status PT Visit Diagnosis: Difficulty in walking, not elsewhere classified (R26.2);Pain Pain - Right/Left: Right Pain - part of body: Hip     Time: 1030-1046 PT Time Calculation (min) (ACUTE ONLY): 16 min  Charges:  $Gait Training: 8-22 mins                     Baxter Flattery, PT  Acute Rehab Dept ([place]) [phone] Pager [phone]  05/16/2020    [facility] 05/16/2020, 11:51 AM

## 2020-05-16 NOTE — Progress Notes (Signed)
Patient's son reported patient's cell phone missing. Patient's son said it is a black flip phone. Looked in the soiled utility to see if it got on a dietary tray taken out of the room. It was not there. Called the [employer] to ask if they saw a cell phone. They said they have not, but are putting out word to cafeteria staff to look, and if it is found, it will be turned into security and the unit be notified that it is found. Secretary also called laundry services to be on the lookout. Will continue to be on the lookout for missing phone.

## 2020-05-16 NOTE — Progress Notes (Signed)
{PT ALL   05/16/20 1400  PT Visit Information  Last PT Received On 05/16/20  Pt progressing slowly. Seen for second session after pt was medicated for pain. Not a significant change from earlier this am when pt did not have meds on board. Will continue to follow   Assistance Needed +1  History of Present Illness 87 y.o. male admitted 05/11/20 for R anterior-THA. PMH includes afib, HTN, skin cancer, L THA.  Subjective Data  Patient Stated Goal I think I will be able to get some help  Precautions  Precautions Fall  Restrictions  RLE Weight Bearing WBAT  Pain Assessment  Pain Assessment 0-10  Faces Pain Scale 4  Pain Location right hip  Pain Descriptors / Indicators Discomfort;Guarding  Pain Intervention(s) Limited activity within patient's tolerance;Monitored during session;Premedicated before session;Repositioned  Cognition  Arousal/Alertness Awake/alert  Behavior During Therapy WFL for tasks assessed/performed  Overall Cognitive Status Within Functional Limits for tasks assessed  Bed Mobility  Overal bed mobility Needs Assistance  Bed Mobility Sit to Supine  Supine to sit Min assist;HOB elevated  Sit to supine Min assist  General bed mobility comments incr time, assist with trunk, pt instructed to use gait belt to assist with RLE on to bed  Transfers  Overall transfer level Needs assistance  Equipment used Rolling walker (2 wheeled)  Transfers Sit to/from Stand  Sit to Stand Min guard;From elevated surface  General transfer comment cues for hand placement  Ambulation/Gait  Ambulation/Gait assistance Min assist  Gait Distance (Feet) 25 Feet  Assistive device Rolling walker (2 wheeled)  Gait Pattern/deviations Step-to pattern;Decreased stride length;Decreased weight shift to right;Decreased step length - left;Antalgic  General Gait Details cues for sequence, use of UEs, incr time needed and pt fatigues easily  Balance  Sitting-balance support Feet supported;No upper extremity  supported  Sitting balance-Leahy Scale Good  Standing balance support Bilateral upper extremity supported  Standing balance-Leahy Scale Poor  Standing balance comment relies on BUE support  Total Joint Exercises  Ankle Circles/Pumps AROM;Both;10 reps;Supine  Long Arc Quad AROM;Right;10 reps  PT - End of Session  Equipment Utilized During Treatment Gait belt  Activity Tolerance Patient limited by fatigue;Patient limited by pain  Patient left with call bell/phone within reach;in bed;with chair alarm set  Nurse Communication Mobility status   PT - Assessment/Plan  PT Plan Current plan remains appropriate  PT Visit Diagnosis Difficulty in walking, not elsewhere classified (R26.2);Pain  Pain - Right/Left Right  Pain - part of body Hip  PT Frequency (ACUTE ONLY) 7X/week  Follow Up Recommendations Follow surgeon's recommendation for DC plan and follow-up therapies (would benefit from SNF, will need 24 hr assist if d/c's home)  PT equipment None recommended by PT  AM-PAC PT "6 Clicks" Mobility Outcome Measure (Version 2)  Help needed turning from your back to your side while in a flat bed without using bedrails? 3  Help needed moving from lying on your back to sitting on the side of a flat bed without using bedrails? 3  Help needed moving to and from a bed to a chair (including a wheelchair)? 3  Help needed standing up from a chair using your arms (e.g., wheelchair or bedside chair)? 3  Help needed to walk in hospital room? 3  Help needed climbing 3-5 steps with a railing?  2  6 Click Score 17  Consider Recommendation of Discharge To: Home with [facility]  PT Goal Progression  Progress towards PT goals Progressing toward goals  Acute Rehab  PT Goals  PT Goal Formulation With patient/family  Time For Goal Achievement 05/19/20  Potential to Achieve Goals Good  PT Time Calculation  PT Start Time (ACUTE ONLY) 1151  PT Stop Time (ACUTE ONLY) 1220  PT Time Calculation (min) (ACUTE ONLY) 29 min  PT  General Charges  $$ ACUTE PT VISIT 1 Visit  PT Treatments  $Gait Training 8-22 mins

## 2020-05-17 DIAGNOSIS — Z96641 Presence of right artificial hip joint: Secondary | ICD-10-CM | POA: Diagnosis not present

## 2020-05-17 DIAGNOSIS — Z79899 Other long term (current) drug therapy: Secondary | ICD-10-CM | POA: Diagnosis not present

## 2020-05-17 DIAGNOSIS — R0600 Dyspnea, unspecified: Secondary | ICD-10-CM | POA: Diagnosis not present

## 2020-05-17 DIAGNOSIS — L97509 Non-pressure chronic ulcer of other part of unspecified foot with unspecified severity: Secondary | ICD-10-CM | POA: Diagnosis not present

## 2020-05-17 DIAGNOSIS — R279 Unspecified lack of coordination: Secondary | ICD-10-CM | POA: Diagnosis not present

## 2020-05-17 DIAGNOSIS — R5381 Other malaise: Secondary | ICD-10-CM | POA: Diagnosis not present

## 2020-05-17 DIAGNOSIS — Z85828 Personal history of other malignant neoplasm of skin: Secondary | ICD-10-CM | POA: Diagnosis not present

## 2020-05-17 DIAGNOSIS — E785 Hyperlipidemia, unspecified: Secondary | ICD-10-CM | POA: Diagnosis not present

## 2020-05-17 DIAGNOSIS — Z471 Aftercare following joint replacement surgery: Secondary | ICD-10-CM | POA: Diagnosis not present

## 2020-05-17 DIAGNOSIS — I10 Essential (primary) hypertension: Secondary | ICD-10-CM | POA: Diagnosis not present

## 2020-05-17 DIAGNOSIS — R0602 Shortness of breath: Secondary | ICD-10-CM | POA: Diagnosis not present

## 2020-05-17 DIAGNOSIS — M1611 Unilateral primary osteoarthritis, right hip: Secondary | ICD-10-CM | POA: Diagnosis not present

## 2020-05-17 DIAGNOSIS — Z743 Need for continuous supervision: Secondary | ICD-10-CM | POA: Diagnosis not present

## 2020-05-17 DIAGNOSIS — Z7901 Long term (current) use of anticoagulants: Secondary | ICD-10-CM | POA: Diagnosis not present

## 2020-05-17 DIAGNOSIS — I499 Cardiac arrhythmia, unspecified: Secondary | ICD-10-CM | POA: Diagnosis not present

## 2020-05-17 DIAGNOSIS — B351 Tinea unguium: Secondary | ICD-10-CM | POA: Diagnosis not present

## 2020-05-17 DIAGNOSIS — Z20822 Contact with and (suspected) exposure to covid-19: Secondary | ICD-10-CM | POA: Diagnosis not present

## 2020-05-17 DIAGNOSIS — M6281 Muscle weakness (generalized): Secondary | ICD-10-CM | POA: Diagnosis not present

## 2020-05-17 DIAGNOSIS — R2681 Unsteadiness on feet: Secondary | ICD-10-CM | POA: Diagnosis not present

## 2020-05-17 DIAGNOSIS — M1990 Unspecified osteoarthritis, unspecified site: Secondary | ICD-10-CM | POA: Diagnosis not present

## 2020-05-17 DIAGNOSIS — R262 Difficulty in walking, not elsewhere classified: Secondary | ICD-10-CM | POA: Diagnosis not present

## 2020-05-17 DIAGNOSIS — R0989 Other specified symptoms and signs involving the circulatory and respiratory systems: Secondary | ICD-10-CM | POA: Diagnosis not present

## 2020-05-17 DIAGNOSIS — I4891 Unspecified atrial fibrillation: Secondary | ICD-10-CM | POA: Diagnosis not present

## 2020-05-17 MED ORDER — TRAMADOL HCL 50 MG PO TABS: 50.0000 mg | ORAL_TABLET | Freq: Four times a day (QID) | ORAL | 0 refills | Status: DC | PRN

## 2020-05-17 MED ORDER — POLYETHYLENE GLYCOL 3350 17 G PO PACK: 17.0000 g | PACK | Freq: Every day | ORAL | 0 refills | Status: AC | PRN

## 2020-05-17 MED ORDER — METHOCARBAMOL 500 MG PO TABS: 500.0000 mg | ORAL_TABLET | Freq: Four times a day (QID) | ORAL | 0 refills | Status: DC | PRN

## 2020-05-17 MED ORDER — ONDANSETRON HCL 4 MG PO TABS: 4.0000 mg | ORAL_TABLET | Freq: Four times a day (QID) | ORAL | 0 refills | Status: DC | PRN

## 2020-05-17 MED ORDER — HYDROCODONE-ACETAMINOPHEN 5-325 MG PO TABS: 1.0000 | ORAL_TABLET | Freq: Four times a day (QID) | ORAL | 0 refills | Status: DC | PRN

## 2020-05-17 NOTE — Plan of Care (Signed)
  Problem: Education: Goal: Knowledge of General Education information will improve Description: Including pain rating scale, medication(s)/side effects and non-pharmacologic comfort measures Outcome: Progressing   Problem: Health Behavior/Discharge Planning: Goal: Ability to manage health-related needs will improve Outcome: Progressing   Problem: Clinical Measurements: Goal: Ability to maintain clinical measurements within normal limits will improve Outcome: Progressing Goal: Will remain free from infection Outcome: Progressing Goal: Diagnostic test results will improve Outcome: Progressing Goal: Respiratory complications will improve Outcome: Progressing Goal: Cardiovascular complication will be avoided Outcome: Progressing   Problem: Activity: Goal: Risk for activity intolerance will decrease Outcome: Progressing   Problem: Coping: Goal: Level of anxiety will decrease Outcome: Progressing   Problem: Elimination: Goal: Will not experience complications related to bowel motility Outcome: Progressing   Problem: Pain Managment: Goal: General experience of comfort will improve Outcome: Progressing   Problem: Safety: Goal: Ability to remain free from injury will improve Outcome: Progressing   Problem: Skin Integrity: Goal: Risk for impaired skin integrity will decrease Outcome: Progressing   Problem: Education: Goal: Knowledge of the prescribed therapeutic regimen will improve Outcome: Progressing Goal: Understanding of discharge needs will improve Outcome: Progressing   Problem: Activity: Goal: Ability to avoid complications of mobility impairment will improve Outcome: Progressing Goal: Ability to tolerate increased activity will improve Outcome: Progressing   Problem: Clinical Measurements: Goal: Postoperative complications will be avoided or minimized Outcome: Progressing   Problem: Pain Management: Goal: Pain level will decrease with appropriate  interventions Outcome: Progressing   Problem: Skin Integrity: Goal: Will show signs of wound healing Outcome: Progressing   

## 2020-05-17 NOTE — Progress Notes (Signed)
Physical Therapy Treatment Patient Details Name: [name] MRN: [id number] DOB: [birth year] Today's Date: 05/17/2020    History of Present Illness 87 y.o. male admitted 05/11/20 for R anterior-THA. PMH includes afib, HTN, skin cancer, L THA.    PT Comments    Pt agreeable and willign to work with PT, requesting to amb to bathroom. contiues to fatigue with shoort distance amb. Recommend SNF to maximize independence.   Follow Up Recommendations  Follow surgeon's recommendation for DC plan and follow-up therapies (would benefit from SNF, will need 24 hr assist if d/c's home)     Equipment Recommendations  None recommended by PT    Recommendations for Other Services       Precautions / Restrictions Precautions Precautions: Fall Restrictions Weight Bearing Restrictions: No RLE Weight Bearing: Weight bearing as tolerated    Mobility  Bed Mobility Overal bed mobility: Needs Assistance Bed Mobility: Supine to Sit     Supine to sit: Min assist;HOB elevated;Min guard     General bed mobility comments: incr time, assist with trunk, pt instructed to use gait belt to assist with RLE on to bed    Transfers Overall transfer level: Needs assistance Equipment used: Rolling walker (2 wheeled) Transfers: Sit to/from Stand Sit to Stand: Min guard;From elevated surface         General transfer comment: cues for hand placement  Ambulation/Gait Ambulation/Gait assistance: Min assist;Min guard Gait Distance (Feet): 12 Feet Assistive device: Rolling walker (2 wheeled) Gait Pattern/deviations: Step-to pattern;Decreased stride length;Decreased weight shift to right;Decreased step length - left;Antalgic     General Gait Details: cues for sequence, use of UEs, incr time needed and pt fatigues easily   Stairs             Wheelchair Mobility    Modified Rankin (Stroke Patients Only)       Balance   Sitting-balance support: Feet supported;No upper extremity  supported Sitting balance-Leahy Scale: Good     Standing balance support: Bilateral upper extremity supported Standing balance-Leahy Scale: Poor Standing balance comment: relies on BUE support                            Cognition Arousal/Alertness: Awake/alert Behavior During Therapy: WFL for tasks assessed/performed Overall Cognitive Status: Within Functional Limits for tasks assessed                                        Exercises Total Joint Exercises Ankle Circles/Pumps: AROM;Both;10 reps;Supine    General Comments        Pertinent Vitals/Pain Pain Assessment: 0-10 Faces Pain Scale: Hurts little more Pain Location: right hip Pain Descriptors / Indicators: Discomfort;Guarding Pain Intervention(s): Limited activity within patient's tolerance;Monitored during session;Repositioned    Home Living                      Prior Function            PT Goals (current goals can now be found in the care plan section) Acute Rehab PT Goals Patient Stated Goal: I think I will be able to get some help PT Goal Formulation: With patient/family Time For Goal Achievement: 05/19/20 Potential to Achieve Goals: Good Progress towards PT goals: Progressing toward goals    Frequency    7X/week      PT Plan Current plan remains appropriate  Co-evaluation              AM-PAC PT "6 Clicks" Mobility   Outcome Measure  Help needed turning from your back to your side while in a flat bed without using bedrails?: A Little Help needed moving from lying on your back to sitting on the side of a flat bed without using bedrails?: A Little Help needed moving to and from a bed to a chair (including a wheelchair)?: A Little Help needed standing up from a chair using your arms (e.g., wheelchair or bedside chair)?: A Little Help needed to walk in hospital room?: A Little Help needed climbing 3-5 steps with a railing? : A Lot 6 Click Score: 17     End of Session Equipment Utilized During Treatment: Gait belt Activity Tolerance: Patient limited by fatigue;Patient limited by pain Patient left: with call bell/phone within reach;Other (comment) (bathroom) Nurse Communication: Mobility status PT Visit Diagnosis: Difficulty in walking, not elsewhere classified (R26.2);Pain Pain - Right/Left: Right Pain - part of body: Hip     Time: 5797-2820 PT Time Calculation (min) (ACUTE ONLY): 17 min  Charges:  $Gait Training: 8-22 mins                     Baxter Flattery, PT  Acute Rehab Dept ([place]) [phone] Pager [phone]  05/17/2020    [facility] 05/17/2020, 11:08 AM

## 2020-05-17 NOTE — Progress Notes (Signed)
Assisted pt to BR with [facility]. Pt Stated" Im try to move my bowels. "  Noted: no bowel movement only flatus both times. Encouraged pt to take prn laxatives/ he declined at this time> " I will take it tomorrow after breakfast."

## 2020-05-17 NOTE — TOC Transition Note (Signed)
Transition of Care [facility]) - CM/SW Discharge Note   Patient Details  Name: [name] MRN: [id number] Date of Birth: 12/14/1929  [facility] CM/SW Contact:  Lennart Pall, LCSW Phone Number: 05/17/2020, 12:35 PM   Clinical Narrative:    Pt is medically cleared for dc to SNF and have received insurance authorization (ref # [id number]).  PTAR called for transport (12:30pm) and nurse to call report to [phone].  No further TOC needs.   Final next level of care: [place] Barriers to Discharge: Barriers Resolved   Patient Goals and CMS Choice Patient states their goals for this hospitalization and ongoing recovery are:: Discharge home with [place] CMS Medicare.gov Compare Post Acute Care list provided to:: Patient Choice offered to / list presented to : Patient  Discharge Placement              Patient chooses bed at: [facility] Patient to be transferred to facility by: [place] Name of family member notified: son, [name] Patient and family notified of of transfer: 05/17/20  Discharge Plan and Services In-house Referral: Clinical Social Work   Post Acute Care Choice: Home Health          DME Arranged: N/A DME Agency: NA       HH Arranged: PT [place] Agency: [facility] [employer] Date [place]: 05/14/20 Time [place]: [place] Representative spoke with at [place]: [place] ([place]) Interventions     Readmission Risk Interventions No flowsheet data found.

## 2020-05-17 NOTE — Discharge Summary (Addendum)
Physician Discharge Summary   Patient ID: [name] MRN: [id number] DOB/AGE: 03-28-1929 87 y.o.  Admit date: 05/11/2020 Discharge date: 05/17/2020  Primary Diagnosis: Osteoarthritis, right hip  Admission Diagnoses:  Past Medical History:  Diagnosis Date  . Arthritis   . Atrial fibrillation ([place]) 04/28/2020  . Hyperlipidemia   . Hypertension   . Skin cancer    Face   Discharge Diagnoses:   Principal Problem:   OA (osteoarthritis) of hip Active Problems:   Osteoarthritis of right hip  Estimated body mass index is 32.46 kg/m as calculated from the following:   Height as of this encounter: 6' 1"  (1.854 m).   Weight as of this encounter: 111.6 kg.  Procedure:  Procedure(s) (LRB): TOTAL HIP ARTHROPLASTY ANTERIOR APPROACH (Right)   Consults: None  HPI: [name] is a 87 y.o. male who has advanced end-stage arthritis of their Right  hip with progressively worsening pain and dysfunction.The patient has failed nonoperative management and presents for total hip arthroplasty.   Laboratory Data: Admission on 05/11/2020  Component Date Value Ref Range Status  . WBC 05/11/2020 7.3  4.0 - 10.5 K/uL Final  . RBC 05/11/2020 5.06  4.22 - 5.81 MIL/uL Final  . Hemoglobin 05/11/2020 16.3  13.0 - 17.0 g/dL Final  . HCT 05/11/2020 48.8  39.0 - 52.0 % Final  . MCV 05/11/2020 96.4  80.0 - 100.0 fL Final  . MCH 05/11/2020 32.2  26.0 - 34.0 pg Final  . MCHC 05/11/2020 33.4  30.0 - 36.0 g/dL Final  . RDW 05/11/2020 12.5  11.5 - 15.5 % Final  . Platelets 05/11/2020 215  150 - 400 K/uL Final  . nRBC 05/11/2020 0.0  0.0 - 0.2 % Final   Performed at [facility], [place] [address], [place], [place] [address]  . ABO/RH(D) 05/11/2020 O NEG   Final  . Antibody Screen 05/11/2020 NEG   Final  . Sample Expiration 05/11/2020    Final                   Value:05/14/2020,2359 Performed at [facility], [place] [address], [place], [place] [address]   . Sodium  05/11/2020 135  135 - 145 mmol/L Final  . Potassium 05/11/2020 3.7  3.5 - 5.1 mmol/L Final  . Chloride 05/11/2020 101  98 - 111 mmol/L Final  . CO2 05/11/2020 26  22 - 32 mmol/L Final  . Glucose, Bld 05/11/2020 128* 70 - 99 mg/dL Final   Glucose reference range applies only to samples taken after fasting for at least 8 hours.  . BUN 05/11/2020 14  8 - 23 mg/dL Final  . Creatinine, Ser 05/11/2020 1.03  0.61 - 1.24 mg/dL Final  . Calcium 05/11/2020 9.1  8.9 - 10.3 mg/dL Final  . Total Protein 05/11/2020 7.1  6.5 - 8.1 g/dL Final  . Albumin 05/11/2020 4.1  3.5 - 5.0 g/dL Final  . AST 05/11/2020 20  15 - 41 U/L Final  . ALT 05/11/2020 13  0 - 44 U/L Final  . Alkaline Phosphatase 05/11/2020 65  38 - 126 U/L Final  . Total Bilirubin 05/11/2020 1.3* 0.3 - 1.2 mg/dL Final  . GFR, Estimated 05/11/2020 >60  >60 mL/min Final   Comment: (NOTE) Calculated using the CKD-EPI Creatinine Equation (2021)   . Anion gap 05/11/2020 8  5 - 15 Final   Performed at [facility], [place] [address], [place], [place] [address]  . Prothrombin Time 05/11/2020 13.6  11.4 -  15.2 seconds Final  . INR 05/11/2020 1.1  0.8 - 1.2 Final   Comment: (NOTE) INR goal varies based on device and disease states. Performed at [facility], [place] [address] [place], [place] [address]   . aPTT 05/11/2020 33  24 - 36 seconds Final   Performed at [facility], [place] [address] [place], [place] [address]  . ABO/RH(D) 05/11/2020    Final                   Value:O NEG Performed at [facility], [place] [address] [place], [place] [address]   . WBC 05/12/2020 17.9* 4.0 - 10.5 K/uL Final  . RBC 05/12/2020 4.85  4.22 - 5.81 MIL/uL Final  . Hemoglobin 05/12/2020 15.6  13.0 - 17.0 g/dL Final  . HCT 05/12/2020 46.9  39.0 - 52.0 % Final  . MCV 05/12/2020 96.7  80.0 - 100.0 fL Final  . MCH 05/12/2020 32.2  26.0 - 34.0 pg Final  . MCHC 05/12/2020 33.3  30.0 - 36.0 g/dL  Final  . RDW 05/12/2020 12.4  11.5 - 15.5 % Final  . Platelets 05/12/2020 282  150 - 400 K/uL Final  . nRBC 05/12/2020 0.0  0.0 - 0.2 % Final   Performed at [facility], [place] [address], [place], [place] [address]  . Sodium 05/12/2020 134* 135 - 145 mmol/L Final  . Potassium 05/12/2020 5.0  3.5 - 5.1 mmol/L Final   DELTA CHECK NOTED  . Chloride 05/12/2020 100  98 - 111 mmol/L Final  . CO2 05/12/2020 24  22 - 32 mmol/L Final  . Glucose, Bld 05/12/2020 155* 70 - 99 mg/dL Final   Glucose reference range applies only to samples taken after fasting for at least 8 hours.  . BUN 05/12/2020 14  8 - 23 mg/dL Final  . Creatinine, Ser 05/12/2020 1.12  0.61 - 1.24 mg/dL Final  . Calcium 05/12/2020 8.9  8.9 - 10.3 mg/dL Final  . GFR, Estimated 05/12/2020 >60  >60 mL/min Final   Comment: (NOTE) Calculated using the CKD-EPI Creatinine Equation (2021)   . Anion gap 05/12/2020 10  5 - 15 Final   Performed at [facility], [place] [address], [place], [place] [address]  . WBC 05/13/2020 19.4* 4.0 - 10.5 K/uL Final  . RBC 05/13/2020 4.40  4.22 - 5.81 MIL/uL Final  . Hemoglobin 05/13/2020 14.1  13.0 - 17.0 g/dL Final  . HCT 05/13/2020 42.7  39.0 - 52.0 % Final  . MCV 05/13/2020 97.0  80.0 - 100.0 fL Final  . MCH 05/13/2020 32.0  26.0 - 34.0 pg Final  . MCHC 05/13/2020 33.0  30.0 - 36.0 g/dL Final  . RDW 05/13/2020 12.3  11.5 - 15.5 % Final  . Platelets 05/13/2020 201  150 - 400 K/uL Final  . nRBC 05/13/2020 0.0  0.0 - 0.2 % Final   Performed at [facility], [place] [address], [place], [place] [address]  . Sodium 05/13/2020 134* 135 - 145 mmol/L Final  . Potassium 05/13/2020 4.8  3.5 - 5.1 mmol/L Final  . Chloride 05/13/2020 102  98 - 111 mmol/L Final  . CO2 05/13/2020 24  22 - 32 mmol/L Final  . Glucose, Bld 05/13/2020 112* 70 - 99 mg/dL Final   Glucose reference range applies only to samples taken after fasting for at least 8 hours.  . BUN  05/13/2020 16  8 - 23 mg/dL Final  .  Creatinine, Ser 05/13/2020 0.88  0.61 - 1.24 mg/dL Final  . Calcium 05/13/2020 8.8* 8.9 - 10.3 mg/dL Final  . GFR, Estimated 05/13/2020 >60  >60 mL/min Final   Comment: (NOTE) Calculated using the CKD-EPI Creatinine Equation (2021)   . Anion gap 05/13/2020 8  5 - 15 Final   Performed at [facility], [place] [address], [place], [place] [address]  . WBC 05/14/2020 12.3* 4.0 - 10.5 K/uL Final  . RBC 05/14/2020 4.09* 4.22 - 5.81 MIL/uL Final  . Hemoglobin 05/14/2020 13.1  13.0 - 17.0 g/dL Final  . HCT 05/14/2020 39.7  39.0 - 52.0 % Final  . MCV 05/14/2020 97.1  80.0 - 100.0 fL Final  . MCH 05/14/2020 32.0  26.0 - 34.0 pg Final  . MCHC 05/14/2020 33.0  30.0 - 36.0 g/dL Final  . RDW 05/14/2020 12.2  11.5 - 15.5 % Final  . Platelets 05/14/2020 195  150 - 400 K/uL Final  . nRBC 05/14/2020 0.0  0.0 - 0.2 % Final   Performed at [facility], [place] [address] [place], [place] [address]  . SARS Coronavirus 2 05/16/2020 NEGATIVE  NEGATIVE Final   Comment: (NOTE) SARS-CoV-2 target nucleic acids are NOT DETECTED.  The SARS-CoV-2 RNA is generally detectable in upper and lower respiratory specimens during the acute phase of infection. Negative results do not preclude SARS-CoV-2 infection, do not rule out co-infections with other pathogens, and should not be used as the sole basis for treatment or other patient management decisions. Negative results must be combined with clinical observations, patient history, and epidemiological information. The expected result is Negative.  Fact Sheet for Patients: SugarRoll.be  Fact Sheet for Healthcare Providers: https://www.woods-mathews.com/  This test is not yet approved or cleared by the [place] FDA and  has been authorized for detection and/or diagnosis of SARS-CoV-2 by FDA under an Emergency Use Authorization (EUA). This EUA will  remain  in effect (meaning this test can be used) for the duration of the COVID-19 declaration under Se                          ction 564(b)(1) of the Act, 21 U.S.C. section 360bbb-3(b)(1), unless the authorization is terminated or revoked sooner.  Performed at [facility], [place] [address], [place], [place] [address]   Hospital Outpatient Visit on 05/09/2020  Component Date Value Ref Range Status  . SARS Coronavirus 2 05/09/2020 NEGATIVE  NEGATIVE Final   Comment: (NOTE) SARS-CoV-2 target nucleic acids are NOT DETECTED.  The SARS-CoV-2 RNA is generally detectable in upper and lower respiratory specimens during the acute phase of infection. Negative results do not preclude SARS-CoV-2 infection, do not rule out co-infections with other pathogens, and should not be used as the sole basis for treatment or other patient management decisions. Negative results must be combined with clinical observations, patient history, and epidemiological information. The expected result is Negative.  Fact Sheet for Patients: SugarRoll.be  Fact Sheet for Healthcare Providers: https://www.woods-mathews.com/  This test is not yet approved or cleared by the [place] FDA and  has been authorized for detection and/or diagnosis of SARS-CoV-2 by FDA under an Emergency Use Authorization (EUA). This EUA will remain  in effect (meaning this test can be used) for the duration of the COVID-19 declaration under Se  ction 564(b)(1) of the Act, 21 U.S.C. section 360bbb-3(b)(1), unless the authorization is terminated or revoked sooner.  Performed at [facility], [place] [address] [place], [place] [address]   Hospital Outpatient Visit on 04/30/2020  Component Date Value Ref Range Status  . SARS Coronavirus 2 04/30/2020 NEGATIVE  NEGATIVE Final   Comment: (NOTE) SARS-CoV-2 target nucleic acids are NOT DETECTED.  The SARS-CoV-2  RNA is generally detectable in upper and lower respiratory specimens during the acute phase of infection. Negative results do not preclude SARS-CoV-2 infection, do not rule out co-infections with other pathogens, and should not be used as the sole basis for treatment or other patient management decisions. Negative results must be combined with clinical observations, patient history, and epidemiological information. The expected result is Negative.  Fact Sheet for Patients: SugarRoll.be  Fact Sheet for Healthcare Providers: https://www.woods-mathews.com/  This test is not yet approved or cleared by the [place] FDA and  has been authorized for detection and/or diagnosis of SARS-CoV-2 by FDA under an Emergency Use Authorization (EUA). This EUA will remain  in effect (meaning this test can be used) for the duration of the COVID-19 declaration under Se                          ction 564(b)(1) of the Act, 21 U.S.C. section 360bbb-3(b)(1), unless the authorization is terminated or revoked sooner.  Performed at [facility], [place] [address] [place], [place] [address]   Hospital Outpatient Visit on 04/27/2020  Component Date Value Ref Range Status  . MRSA, PCR 04/27/2020 NEGATIVE  NEGATIVE Final  . Staphylococcus aureus 04/27/2020 NEGATIVE  NEGATIVE Final   Comment: (NOTE) The Xpert SA Assay (FDA approved for NASAL specimens in patients 96 years of age and older), is one component of a comprehensive surveillance program. It is not intended to diagnose infection nor to guide or monitor treatment. Performed at [facility], [place] [address] [place], [place] [address]   Appointment on 04/22/2020  Component Date Value Ref Range Status  . Specific Gravity, UA 04/22/2020 1.020  1.005 - 1.030 Final  . pH, UA 04/22/2020 5.5  5.0 - 7.5 Final  . Color, UA 04/22/2020 Yellow  Yellow Final  . Appearance Ur 04/22/2020 Clear   Clear Final  . Leukocytes,UA 04/22/2020 Negative  Negative Final  . Protein,UA 04/22/2020 Negative  Negative/Trace Final  . Glucose, UA 04/22/2020 Negative  Negative Final  . Ketones, UA 04/22/2020 Negative  Negative Final  . RBC, UA 04/22/2020 Negative  Negative Final  . Bilirubin, UA 04/22/2020 Negative  Negative Final  . Urobilinogen, Ur 04/22/2020 2.0* 0.2 - 1.0 mg/dL Final  . Nitrite, UA 04/22/2020 Negative  Negative Final  . Microscopic Examination 04/22/2020 See below:   Final  . WBC, UA 04/22/2020 None seen  0 - 5 /hpf Final  . RBC 04/22/2020 None seen  0 - 2 /hpf Final  . Epithelial Cells (non renal) 04/22/2020 None seen  0 - 10 /hpf Final  . Bacteria, UA 04/22/2020 None seen  None seen/Few Final  Office Visit on 03/31/2020  Component Date Value Ref Range Status  . WBC 03/31/2020 8.7  3.4 - 10.8 x10E3/uL Final  . RBC 03/31/2020 5.22  4.14 - 5.80 x10E6/uL Final  . Hemoglobin 03/31/2020 16.4  13.0 - 17.7 g/dL Final  . Hematocrit 03/31/2020 49.0  37.5 - 51.0 % Final  . MCV 03/31/2020 94  79 - 97 fL Final  .  MCH 03/31/2020 31.4  26.6 - 33.0 pg Final  . MCHC 03/31/2020 33.5  31.5 - 35.7 g/dL Final  . RDW 03/31/2020 12.2  11.6 - 15.4 % Final  . Platelets 03/31/2020 264  150 - 450 x10E3/uL Final  . Neutrophils 03/31/2020 54  Not Estab. % Final  . Lymphs 03/31/2020 31  Not Estab. % Final  . Monocytes 03/31/2020 13  Not Estab. % Final  . Eos 03/31/2020 1  Not Estab. % Final  . Basos 03/31/2020 1  Not Estab. % Final  . Neutrophils Absolute 03/31/2020 4.8  1.4 - 7.0 x10E3/uL Final  . Lymphocytes Absolute 03/31/2020 2.7  0.7 - 3.1 x10E3/uL Final  . Monocytes Absolute 03/31/2020 1.1* 0.1 - 0.9 x10E3/uL Final  . EOS (ABSOLUTE) 03/31/2020 0.0  0.0 - 0.4 x10E3/uL Final  . Basophils Absolute 03/31/2020 0.1  0.0 - 0.2 x10E3/uL Final  . Immature Granulocytes 03/31/2020 0  Not Estab. % Final  . Immature Grans (Abs) 03/31/2020 0.0  0.0 - 0.1 x10E3/uL Final  . Glucose 03/31/2020 86  65 - 99  mg/dL Final  . BUN 03/31/2020 16  8 - 27 mg/dL Final  . Creatinine, Ser 03/31/2020 1.15  0.76 - 1.27 mg/dL Final   Comment:                **Effective April 04, 2020 [facility] will begin**                  reporting the 2021 CKD-EPI creatinine equation that                  estimates kidney function without a race variable.   Marland Kitchen GFR calc non Af Amer 03/31/2020 56* >59 mL/min/1.73 Final  . GFR calc Af Amer 03/31/2020 65  >59 mL/min/1.73 Final   Comment: **In accordance with recommendations from the NKF-ASN Task force,**   [facility] is in the process of updating its eGFR calculation to the   2021 CKD-EPI creatinine equation that estimates kidney function   without a race variable.   . BUN/Creatinine Ratio 03/31/2020 14  10 - 24 Final  . Sodium 03/31/2020 139  134 - 144 mmol/L Final  . Potassium 03/31/2020 4.8  3.5 - 5.2 mmol/L Final  . Chloride 03/31/2020 99  96 - 106 mmol/L Final  . CO2 03/31/2020 23  20 - 29 mmol/L Final  . Calcium 03/31/2020 9.2  8.6 - 10.2 mg/dL Final  . Total Protein 03/31/2020 6.8  6.0 - 8.5 g/dL Final  . Albumin 03/31/2020 3.9  3.6 - 4.6 g/dL Final  . Globulin, Total 03/31/2020 2.9  1.5 - 4.5 g/dL Final  . Albumin/Globulin Ratio 03/31/2020 1.3  1.2 - 2.2 Final  . Bilirubin Total 03/31/2020 0.7  0.0 - 1.2 mg/dL Final  . Alkaline Phosphatase 03/31/2020 72  44 - 121 IU/L Final  . AST 03/31/2020 19  0 - 40 IU/L Final  . ALT 03/31/2020 10  0 - 44 IU/L Final  . HB A1C (BAYER DCA - WAIVED) 03/31/2020 5.1  <7.0 % Final   Comment:                                       Diabetic Adult            <7.0  Healthy Adult        4.3 - 5.7                                                           (DCCT/NGSP) American Diabetes Association's Summary of Glycemic Recommendations for Adults with Diabetes: Hemoglobin A1c <7.0%. More stringent glycemic goals (A1c <6.0%) may further reduce complications at the cost of increased risk of hypoglycemia.    . INR 03/31/2020 1.1  0.9 - 1.1 Final  . Prothrombin Time 03/31/2020 13.0  sec Final   Comment: Differences in reagents, instruments, and pre-analytical variables can affect prothrombin time results.  These factors should be considered when comparing different prothrombin time test methods. Please Note: This test should not be used to monitor persons on heparin therapy.      X-Rays:DG Pelvis Portable  Result Date: 05/11/2020 CLINICAL DATA:  Postop total right hip replacement EXAM: PORTABLE PELVIS 1-2 VIEWS COMPARISON:  Intraoperative imaging earlier today FINDINGS: Remote changes of left hip replacement and new changes of right hip replacement. Normal AP alignment. No hardware bony complicating feature. IMPRESSION: Right hip replacement.  No visible complicating feature. Electronically Signed   By: Rolm Baptise M.D.   On: 05/11/2020 17:51   DG C-Arm 1-60 Min-No Report  Result Date: 05/11/2020 Fluoroscopy was utilized by the requesting physician.  No radiographic interpretation.   DG HIP OPERATIVE UNILAT W OR W/O PELVIS RIGHT  Result Date: 05/11/2020 CLINICAL DATA:  Operative right hip. EXAM: OPERATIVE RIGHT HIP (WITH PELVIS IF PERFORMED) 11 VIEWS TECHNIQUE: Fluoroscopic spot image(s) were submitted for interpretation post-operatively. COMPARISON:  December 27, 2004 FINDINGS: Fluoro time: 18 seconds. Eleven C-arm fluoroscopic images were obtained intraoperatively and submitted for post operative interpretation. These images demonstrate changes of right total hip arthroplasty. Partially imaged prior left total hip arthroplasty. Please see the performing provider's procedural report for further detail. IMPRESSION: Right total hip arthroplasty. Electronically Signed   By: Margaretha Sheffield MD   On: 05/11/2020 17:49    EKG: Orders placed or performed in visit on 05/04/20  . EKG 12-Lead     Hospital Course: Jaishon Krisher is a 87 y.o. who was admitted to [facility]. They were brought  to the operating room on 05/11/2020 and underwent Procedure(s): [place].  Patient tolerated the procedure well and was later transferred to the recovery room and then to the orthopaedic floor for postoperative care. They were given PO and IV analgesics for pain control following their surgery. They were given 24 hours of postoperative antibiotics of  Anti-infectives (From admission, onward)   Start     Dose/Rate Route Frequency Ordered Stop   05/12/20 0600  ceFAZolin (ANCEF) IVPB 2g/100 mL premix        2 g 200 mL/hr over 30 Minutes Intravenous On call to O.R. 05/11/20 1212 05/11/20 1416   05/11/20 2000  ceFAZolin (ANCEF) IVPB 2g/100 mL premix        2 g 200 mL/hr over 30 Minutes Intravenous Every 6 hours 05/11/20 1829 05/12/20 0119     and started on DVT prophylaxis in the form of Eliquis.   PT and OT were ordered for total joint protocol. Discharge planning consulted to help with postop disposition and equipment needs. Patient had a decent night on the evening of surgery.  They started to get up OOB with therapy on POD #0. Patient worked with therapy and was slow to progress with mobility and independence. Original plan was for patient to discharge home with home health, unfortunately we were unable to find an agency to accommodate his needs. Due to patient living at home alone, it was determined that a skilled nursing facility would be the most appropriate disposition plan. Aquacel was clean, dry, and intact throughout hospital stay. Social work arranged a bed and on POD #6, patient was discharged to SNF in stable condition  Diet: Cardiac diet Activity: WBAT Follow-up: in 2 weeks Disposition: Skilled nursing facility Discharged Condition: stable   Discharge Instructions    Call MD / Call 911   Complete by: As directed    If you experience chest pain or shortness of breath, CALL 911 and be transported to the hospital emergency room.  If you develope a fever  above 101 F, pus (white drainage) or increased drainage or redness at the wound, or calf pain, call your surgeon's office.   Change dressing   Complete by: As directed    You have an adhesive waterproof bandage over the incision. Leave this in place until your first follow-up appointment. Once you remove this you will not need to place another bandage.   Constipation Prevention   Complete by: As directed    Drink plenty of fluids.  Prune juice may be helpful.  You may use a stool softener, such as Colace (over the counter) 100 mg twice a day.  Use MiraLax (over the counter) for constipation as needed.   Diet - low sodium heart healthy   Complete by: As directed    Do not sit on low chairs, stoools or toilet seats, as it may be difficult to get up from low surfaces   Complete by: As directed    Driving restrictions   Complete by: As directed    No driving for two weeks   TED hose   Complete by: As directed    Use stockings (TED hose) for three weeks on both leg(s).  You may remove them at night for sleeping.   Weight bearing as tolerated   Complete by: As directed      Allergies as of 05/17/2020   No Known Allergies     Medication List    TAKE these medications   apixaban 5 MG Tabs tablet Commonly known as: ELIQUIS Take 1 tablet (5 mg total) by mouth 2 (two) times daily.   fish oil-omega-3 fatty acids 1000 MG capsule Take 2 g by mouth daily.   furosemide 20 MG tablet Commonly known as: LASIX Take 1 tablet (20 mg total) by mouth daily as needed for fluid.   HYDROcodone-acetaminophen 5-325 MG tablet Commonly known as: NORCO/VICODIN Take 1-2 tablets by mouth every 6 (six) hours as needed for severe pain.   lisinopril 40 MG tablet Commonly known as: ZESTRIL TAKE 1 TABLET DAILY   methocarbamol 500 MG tablet Commonly known as: ROBAXIN Take 1 tablet (500 mg total) by mouth every 6 (six) hours as needed for muscle spasms.   ondansetron 4 MG tablet Commonly known as:  ZOFRAN Take 1 tablet (4 mg total) by mouth every 6 (six) hours as needed for nausea.   polyethylene glycol 17 g packet Commonly known as: MIRALAX / GLYCOLAX Take 17 g by mouth daily as needed for mild constipation.   rosuvastatin 5 MG tablet Commonly known as: Crestor Take 0.5 tab on Monday, Wednesday  and Friday evenings What changed:   how much to take  how to take this  when to take this  additional instructions   traMADol 50 MG tablet Commonly known as: ULTRAM Take 1-2 tablets (50-100 mg total) by mouth every 6 (six) hours as needed for moderate pain.            Discharge Care Instructions  (From admission, onward)         Start     Ordered   05/13/20 0000  Weight bearing as tolerated        05/13/20 0814   05/13/20 0000  Change dressing       Comments: You have an adhesive waterproof bandage over the incision. Leave this in place until your first follow-up appointment. Once you remove this you will not need to place another bandage.   05/13/20 3382          Contact information for follow-up providers    [name], MD In 2 weeks.   Specialty: Orthopedic Surgery Contact information: [address] [place] [address] [phone]        Health, [place] Follow up.   Specialty: [place] Why: agency will provide home health therapy Contact information: [place] [address] [phone]            Contact information for after-discharge care    Destination    [facility] [place] [facility] .   Service: Skilled Nursing Contact information: [address] [place]                  Signed: Theresa Duty, PA-C Orthopedic Surgery 05/17/2020, 8:17 AM

## 2020-05-17 NOTE — Progress Notes (Signed)
Report called to [employer] [phone].

## 2020-05-18 DIAGNOSIS — L97509 Non-pressure chronic ulcer of other part of unspecified foot with unspecified severity: Secondary | ICD-10-CM | POA: Diagnosis not present

## 2020-05-18 DIAGNOSIS — R0602 Shortness of breath: Secondary | ICD-10-CM | POA: Diagnosis not present

## 2020-05-18 DIAGNOSIS — E785 Hyperlipidemia, unspecified: Secondary | ICD-10-CM | POA: Diagnosis not present

## 2020-05-18 DIAGNOSIS — I4891 Unspecified atrial fibrillation: Secondary | ICD-10-CM | POA: Diagnosis not present

## 2020-05-18 DIAGNOSIS — B351 Tinea unguium: Secondary | ICD-10-CM | POA: Diagnosis not present

## 2020-05-18 DIAGNOSIS — R5381 Other malaise: Secondary | ICD-10-CM | POA: Diagnosis not present

## 2020-05-18 DIAGNOSIS — M1990 Unspecified osteoarthritis, unspecified site: Secondary | ICD-10-CM | POA: Diagnosis not present

## 2020-05-18 DIAGNOSIS — I10 Essential (primary) hypertension: Secondary | ICD-10-CM | POA: Diagnosis not present

## 2020-05-18 DIAGNOSIS — R0600 Dyspnea, unspecified: Secondary | ICD-10-CM | POA: Diagnosis not present

## 2020-05-19 DIAGNOSIS — E785 Hyperlipidemia, unspecified: Secondary | ICD-10-CM | POA: Diagnosis not present

## 2020-05-19 DIAGNOSIS — M1990 Unspecified osteoarthritis, unspecified site: Secondary | ICD-10-CM | POA: Diagnosis not present

## 2020-05-19 DIAGNOSIS — I10 Essential (primary) hypertension: Secondary | ICD-10-CM | POA: Diagnosis not present

## 2020-05-19 DIAGNOSIS — R5381 Other malaise: Secondary | ICD-10-CM | POA: Diagnosis not present

## 2020-05-19 DIAGNOSIS — I4891 Unspecified atrial fibrillation: Secondary | ICD-10-CM | POA: Diagnosis not present

## 2020-05-19 DIAGNOSIS — R0989 Other specified symptoms and signs involving the circulatory and respiratory systems: Secondary | ICD-10-CM | POA: Diagnosis not present

## 2020-05-19 DIAGNOSIS — B351 Tinea unguium: Secondary | ICD-10-CM | POA: Diagnosis not present

## 2020-05-19 DIAGNOSIS — R0600 Dyspnea, unspecified: Secondary | ICD-10-CM | POA: Diagnosis not present

## 2020-05-24 ENCOUNTER — Other Ambulatory Visit (HOSPITAL_COMMUNITY): Payer: Medicare Other

## 2020-05-25 DIAGNOSIS — I10 Essential (primary) hypertension: Secondary | ICD-10-CM | POA: Diagnosis not present

## 2020-05-25 DIAGNOSIS — I4891 Unspecified atrial fibrillation: Secondary | ICD-10-CM | POA: Diagnosis not present

## 2020-05-25 DIAGNOSIS — M1990 Unspecified osteoarthritis, unspecified site: Secondary | ICD-10-CM | POA: Diagnosis not present

## 2020-05-25 DIAGNOSIS — E785 Hyperlipidemia, unspecified: Secondary | ICD-10-CM | POA: Diagnosis not present

## 2020-05-25 DIAGNOSIS — R5381 Other malaise: Secondary | ICD-10-CM | POA: Diagnosis not present

## 2020-05-30 DIAGNOSIS — R5381 Other malaise: Secondary | ICD-10-CM | POA: Diagnosis not present

## 2020-05-30 DIAGNOSIS — R0989 Other specified symptoms and signs involving the circulatory and respiratory systems: Secondary | ICD-10-CM | POA: Diagnosis not present

## 2020-05-30 DIAGNOSIS — I4891 Unspecified atrial fibrillation: Secondary | ICD-10-CM | POA: Diagnosis not present

## 2020-05-30 DIAGNOSIS — M1990 Unspecified osteoarthritis, unspecified site: Secondary | ICD-10-CM | POA: Diagnosis not present

## 2020-05-30 DIAGNOSIS — B351 Tinea unguium: Secondary | ICD-10-CM | POA: Diagnosis not present

## 2020-05-30 DIAGNOSIS — E785 Hyperlipidemia, unspecified: Secondary | ICD-10-CM | POA: Diagnosis not present

## 2020-05-30 DIAGNOSIS — I10 Essential (primary) hypertension: Secondary | ICD-10-CM | POA: Diagnosis not present

## 2020-05-31 DIAGNOSIS — Z96643 Presence of artificial hip joint, bilateral: Secondary | ICD-10-CM | POA: Diagnosis not present

## 2020-05-31 DIAGNOSIS — I10 Essential (primary) hypertension: Secondary | ICD-10-CM | POA: Diagnosis not present

## 2020-05-31 DIAGNOSIS — Z85828 Personal history of other malignant neoplasm of skin: Secondary | ICD-10-CM | POA: Diagnosis not present

## 2020-05-31 DIAGNOSIS — E785 Hyperlipidemia, unspecified: Secondary | ICD-10-CM | POA: Diagnosis not present

## 2020-05-31 DIAGNOSIS — I4891 Unspecified atrial fibrillation: Secondary | ICD-10-CM | POA: Diagnosis not present

## 2020-05-31 DIAGNOSIS — Z7901 Long term (current) use of anticoagulants: Secondary | ICD-10-CM | POA: Diagnosis not present

## 2020-05-31 DIAGNOSIS — I878 Other specified disorders of veins: Secondary | ICD-10-CM | POA: Diagnosis not present

## 2020-05-31 DIAGNOSIS — B351 Tinea unguium: Secondary | ICD-10-CM | POA: Diagnosis not present

## 2020-05-31 DIAGNOSIS — Z471 Aftercare following joint replacement surgery: Secondary | ICD-10-CM | POA: Diagnosis not present

## 2020-06-02 ENCOUNTER — Ambulatory Visit: Payer: Medicare Other | Admitting: Cardiology

## 2020-06-03 DIAGNOSIS — Z7901 Long term (current) use of anticoagulants: Secondary | ICD-10-CM | POA: Diagnosis not present

## 2020-06-03 DIAGNOSIS — I4891 Unspecified atrial fibrillation: Secondary | ICD-10-CM | POA: Diagnosis not present

## 2020-06-03 DIAGNOSIS — B351 Tinea unguium: Secondary | ICD-10-CM | POA: Diagnosis not present

## 2020-06-03 DIAGNOSIS — I10 Essential (primary) hypertension: Secondary | ICD-10-CM | POA: Diagnosis not present

## 2020-06-03 DIAGNOSIS — I878 Other specified disorders of veins: Secondary | ICD-10-CM | POA: Diagnosis not present

## 2020-06-03 DIAGNOSIS — E785 Hyperlipidemia, unspecified: Secondary | ICD-10-CM | POA: Diagnosis not present

## 2020-06-03 DIAGNOSIS — Z85828 Personal history of other malignant neoplasm of skin: Secondary | ICD-10-CM | POA: Diagnosis not present

## 2020-06-03 DIAGNOSIS — Z96643 Presence of artificial hip joint, bilateral: Secondary | ICD-10-CM | POA: Diagnosis not present

## 2020-06-03 DIAGNOSIS — Z471 Aftercare following joint replacement surgery: Secondary | ICD-10-CM | POA: Diagnosis not present

## 2020-06-06 DIAGNOSIS — E785 Hyperlipidemia, unspecified: Secondary | ICD-10-CM | POA: Diagnosis not present

## 2020-06-06 DIAGNOSIS — I10 Essential (primary) hypertension: Secondary | ICD-10-CM | POA: Diagnosis not present

## 2020-06-06 DIAGNOSIS — Z7901 Long term (current) use of anticoagulants: Secondary | ICD-10-CM | POA: Diagnosis not present

## 2020-06-06 DIAGNOSIS — Z96643 Presence of artificial hip joint, bilateral: Secondary | ICD-10-CM | POA: Diagnosis not present

## 2020-06-06 DIAGNOSIS — Z471 Aftercare following joint replacement surgery: Secondary | ICD-10-CM | POA: Diagnosis not present

## 2020-06-06 DIAGNOSIS — I4891 Unspecified atrial fibrillation: Secondary | ICD-10-CM | POA: Diagnosis not present

## 2020-06-06 DIAGNOSIS — B351 Tinea unguium: Secondary | ICD-10-CM | POA: Diagnosis not present

## 2020-06-06 DIAGNOSIS — I878 Other specified disorders of veins: Secondary | ICD-10-CM | POA: Diagnosis not present

## 2020-06-06 DIAGNOSIS — Z85828 Personal history of other malignant neoplasm of skin: Secondary | ICD-10-CM | POA: Diagnosis not present

## 2020-06-08 DIAGNOSIS — Z96643 Presence of artificial hip joint, bilateral: Secondary | ICD-10-CM | POA: Diagnosis not present

## 2020-06-08 DIAGNOSIS — Z85828 Personal history of other malignant neoplasm of skin: Secondary | ICD-10-CM | POA: Diagnosis not present

## 2020-06-08 DIAGNOSIS — Z7901 Long term (current) use of anticoagulants: Secondary | ICD-10-CM | POA: Diagnosis not present

## 2020-06-08 DIAGNOSIS — I10 Essential (primary) hypertension: Secondary | ICD-10-CM | POA: Diagnosis not present

## 2020-06-08 DIAGNOSIS — E785 Hyperlipidemia, unspecified: Secondary | ICD-10-CM | POA: Diagnosis not present

## 2020-06-08 DIAGNOSIS — B351 Tinea unguium: Secondary | ICD-10-CM | POA: Diagnosis not present

## 2020-06-08 DIAGNOSIS — I4891 Unspecified atrial fibrillation: Secondary | ICD-10-CM | POA: Diagnosis not present

## 2020-06-08 DIAGNOSIS — I878 Other specified disorders of veins: Secondary | ICD-10-CM | POA: Diagnosis not present

## 2020-06-08 DIAGNOSIS — Z471 Aftercare following joint replacement surgery: Secondary | ICD-10-CM | POA: Diagnosis not present

## 2020-06-16 DIAGNOSIS — I4891 Unspecified atrial fibrillation: Secondary | ICD-10-CM | POA: Diagnosis not present

## 2020-06-16 DIAGNOSIS — Z96643 Presence of artificial hip joint, bilateral: Secondary | ICD-10-CM | POA: Diagnosis not present

## 2020-06-16 DIAGNOSIS — B351 Tinea unguium: Secondary | ICD-10-CM | POA: Diagnosis not present

## 2020-06-16 DIAGNOSIS — Z471 Aftercare following joint replacement surgery: Secondary | ICD-10-CM | POA: Diagnosis not present

## 2020-06-16 DIAGNOSIS — I878 Other specified disorders of veins: Secondary | ICD-10-CM | POA: Diagnosis not present

## 2020-06-16 DIAGNOSIS — I10 Essential (primary) hypertension: Secondary | ICD-10-CM | POA: Diagnosis not present

## 2020-06-16 DIAGNOSIS — Z85828 Personal history of other malignant neoplasm of skin: Secondary | ICD-10-CM | POA: Diagnosis not present

## 2020-06-16 DIAGNOSIS — Z7901 Long term (current) use of anticoagulants: Secondary | ICD-10-CM | POA: Diagnosis not present

## 2020-06-16 DIAGNOSIS — E785 Hyperlipidemia, unspecified: Secondary | ICD-10-CM | POA: Diagnosis not present

## 2020-06-22 DIAGNOSIS — B351 Tinea unguium: Secondary | ICD-10-CM | POA: Diagnosis not present

## 2020-06-22 DIAGNOSIS — I878 Other specified disorders of veins: Secondary | ICD-10-CM | POA: Diagnosis not present

## 2020-06-22 DIAGNOSIS — I10 Essential (primary) hypertension: Secondary | ICD-10-CM | POA: Diagnosis not present

## 2020-06-22 DIAGNOSIS — Z85828 Personal history of other malignant neoplasm of skin: Secondary | ICD-10-CM | POA: Diagnosis not present

## 2020-06-22 DIAGNOSIS — I4891 Unspecified atrial fibrillation: Secondary | ICD-10-CM | POA: Diagnosis not present

## 2020-06-22 DIAGNOSIS — Z96643 Presence of artificial hip joint, bilateral: Secondary | ICD-10-CM | POA: Diagnosis not present

## 2020-06-22 DIAGNOSIS — E785 Hyperlipidemia, unspecified: Secondary | ICD-10-CM | POA: Diagnosis not present

## 2020-06-22 DIAGNOSIS — Z7901 Long term (current) use of anticoagulants: Secondary | ICD-10-CM | POA: Diagnosis not present

## 2020-06-22 DIAGNOSIS — Z471 Aftercare following joint replacement surgery: Secondary | ICD-10-CM | POA: Diagnosis not present

## 2020-07-21 DIAGNOSIS — Z4789 Encounter for other orthopedic aftercare: Secondary | ICD-10-CM | POA: Diagnosis not present

## 2020-08-26 ENCOUNTER — Ambulatory Visit: Payer: Medicare Other | Admitting: Family Medicine

## 2020-09-09 ENCOUNTER — Other Ambulatory Visit: Payer: Self-pay | Admitting: Family Medicine

## 2020-09-09 DIAGNOSIS — I10 Essential (primary) hypertension: Secondary | ICD-10-CM

## 2020-10-12 ENCOUNTER — Ambulatory Visit: Payer: Medicare Other | Admitting: Cardiovascular Disease

## 2020-10-12 ENCOUNTER — Other Ambulatory Visit: Payer: Self-pay | Admitting: Family Medicine

## 2020-10-12 ENCOUNTER — Telehealth: Payer: Self-pay | Admitting: Family Medicine

## 2020-10-12 DIAGNOSIS — I878 Other specified disorders of veins: Secondary | ICD-10-CM

## 2020-10-12 DIAGNOSIS — I10 Essential (primary) hypertension: Secondary | ICD-10-CM

## 2020-10-12 NOTE — Telephone Encounter (Signed)
Pt has an appt on 9/14 to see Britney for his CPE. Daughter called requesting to speak with Britney before pt comes in for his appt to discuss a few things that pt may not mention. Pt is very hard of hearing. Daughter may not be able to be with pt when he comes for his appt. Main concern is regarding severe pain with his feet.

## 2020-10-12 NOTE — Telephone Encounter (Signed)
Called daughter back about patient.  Patient has an appointment on 9/14.  Patients son normally comes with patient to his appointments but he is not able to this time.  Daughter states that she will try to come to the appointment but she does not live in town so she might not be able to. Wanted to let you know that patient has been having trouble with bilateral leg and feet swelling x 1 year.  He has been wearing compression stocking but it is not helping.  Daughter tries to get patient to elevate his feet but he does not.  Wanting to know what next steps are? If she is not able to come to the appointment then she would like to know if you can call her after his appointment ?

## 2020-10-13 NOTE — Telephone Encounter (Signed)
As long as he gives me permission to call her after the appointment, I can do this.

## 2020-10-19 ENCOUNTER — Encounter: Payer: Self-pay | Admitting: Family Medicine

## 2020-10-19 ENCOUNTER — Other Ambulatory Visit: Payer: Self-pay

## 2020-10-19 ENCOUNTER — Ambulatory Visit (INDEPENDENT_AMBULATORY_CARE_PROVIDER_SITE_OTHER): Payer: Medicare Other | Admitting: Family Medicine

## 2020-10-19 VITALS — BP 144/82 | HR 70 | Temp 97.4°F | Ht 73.0 in | Wt 240.4 lb

## 2020-10-19 DIAGNOSIS — I878 Other specified disorders of veins: Secondary | ICD-10-CM | POA: Diagnosis not present

## 2020-10-19 DIAGNOSIS — H6121 Impacted cerumen, right ear: Secondary | ICD-10-CM | POA: Diagnosis not present

## 2020-10-19 DIAGNOSIS — Z0000 Encounter for general adult medical examination without abnormal findings: Secondary | ICD-10-CM | POA: Diagnosis not present

## 2020-10-19 DIAGNOSIS — I872 Venous insufficiency (chronic) (peripheral): Secondary | ICD-10-CM | POA: Diagnosis not present

## 2020-10-19 DIAGNOSIS — I10 Essential (primary) hypertension: Secondary | ICD-10-CM

## 2020-10-19 DIAGNOSIS — E782 Mixed hyperlipidemia: Secondary | ICD-10-CM | POA: Diagnosis not present

## 2020-10-19 DIAGNOSIS — Z0001 Encounter for general adult medical examination with abnormal findings: Secondary | ICD-10-CM | POA: Diagnosis not present

## 2020-10-19 DIAGNOSIS — I482 Chronic atrial fibrillation, unspecified: Secondary | ICD-10-CM

## 2020-10-19 LAB — CMP14+EGFR
ALT: 8 IU/L (ref 0–44)
AST: 13 IU/L (ref 0–40)
Albumin/Globulin Ratio: 1.8 (ref 1.2–2.2)
Albumin: 3.8 g/dL (ref 3.6–4.6)
Alkaline Phosphatase: 80 IU/L (ref 44–121)
BUN/Creatinine Ratio: 13 (ref 10–24)
BUN: 14 mg/dL (ref 8–27)
Bilirubin Total: 0.6 mg/dL (ref 0.0–1.2)
CO2: 25 mmol/L (ref 20–29)
Calcium: 9.4 mg/dL (ref 8.6–10.2)
Chloride: 100 mmol/L (ref 96–106)
Creatinine, Ser: 1.09 mg/dL (ref 0.76–1.27)
Globulin, Total: 2.1 g/dL (ref 1.5–4.5)
Glucose: 85 mg/dL (ref 65–99)
Potassium: 4.9 mmol/L (ref 3.5–5.2)
Sodium: 139 mmol/L (ref 134–144)
Total Protein: 5.9 g/dL — ABNORMAL LOW (ref 6.0–8.5)
eGFR: 65 mL/min/{1.73_m2} (ref >59)

## 2020-10-19 LAB — CBC WITH DIFFERENTIAL/PLATELET
Basophils Absolute: 0 10*3/uL (ref 0.0–0.2)
Basos: 1 %
EOS (ABSOLUTE): 0.1 10*3/uL (ref 0.0–0.4)
Eos: 2 %
Hematocrit: 43.5 % (ref 37.5–51.0)
Hemoglobin: 15.1 g/dL (ref 13.0–17.7)
Immature Grans (Abs): 0 10*3/uL (ref 0.0–0.1)
Immature Granulocytes: 0 %
Lymphocytes Absolute: 2.3 10*3/uL (ref 0.7–3.1)
Lymphs: 31 %
MCH: 30.8 pg (ref 26.6–33.0)
MCHC: 34.7 g/dL (ref 31.5–35.7)
MCV: 89 fL (ref 79–97)
Monocytes Absolute: 1 10*3/uL — ABNORMAL HIGH (ref 0.1–0.9)
Monocytes: 13 %
Neutrophils Absolute: 4.1 10*3/uL (ref 1.4–7.0)
Neutrophils: 53 %
Platelets: 232 10*3/uL (ref 150–450)
RBC: 4.91 x10E6/uL (ref 4.14–5.80)
RDW: 12 % (ref 11.6–15.4)
WBC: 7.5 10*3/uL (ref 3.4–10.8)

## 2020-10-19 LAB — LIPID PANEL
Chol/HDL Ratio: 2.4 ratio (ref 0.0–5.0)
Cholesterol, Total: 151 mg/dL (ref 100–199)
HDL: 63 mg/dL (ref >39)
LDL Chol Calc (NIH): 73 mg/dL (ref 0–99)
Triglycerides: 80 mg/dL (ref 0–149)
VLDL Cholesterol Cal: 15 mg/dL (ref 5–40)

## 2020-10-19 MED ORDER — ROSUVASTATIN CALCIUM 5 MG PO TABS
2.5000 mg | ORAL_TABLET | ORAL | 1 refills | Status: DC
Start: 2020-10-19 — End: 2021-04-26

## 2020-10-19 MED ORDER — FUROSEMIDE 20 MG PO TABS: ORAL_TABLET | ORAL | 1 refills | Status: DC

## 2020-10-19 MED ORDER — LISINOPRIL 40 MG PO TABS: 40.0000 mg | ORAL_TABLET | Freq: Every day | ORAL | 1 refills | Status: DC

## 2020-10-19 MED ORDER — TRIAMCINOLONE ACETONIDE 0.1 % EX OINT: 1.0000 "application " | TOPICAL_OINTMENT | Freq: Two times a day (BID) | CUTANEOUS | 0 refills | Status: AC

## 2020-10-19 MED ORDER — APIXABAN 5 MG PO TABS: 5.0000 mg | ORAL_TABLET | Freq: Two times a day (BID) | ORAL | 1 refills | Status: AC

## 2020-10-19 NOTE — Progress Notes (Signed)
Assessment & Plan:  1. Well adult exam - preventative health information provided - advance directive forms and education provided - continue healthy diet and exercise - CBC with Differential/Platelet - CMP14+EGFR - Lipid panel  2. Essential hypertension - Well controlled on current regimen - continue lasix and lisinopril as prescribed. - furosemide (LASIX) 20 MG tablet; TAKE 1 TABLET DAILY AS NEEDED FOR FLUID  Dispense: 90 tablet; Refill: 1 - lisinopril (ZESTRIL) 40 MG tablet; Take 1 tablet (40 mg total) by mouth daily.  Dispense: 90 tablet; Refill: 1 - CBC with Differential/Platelet - CMP14+EGFR - Lipid panel  3. Chronic atrial fibrillation ([place]) - has not been taking his eliquis, only filled prescription once in March.  - encouraged to resume taking as prescribed - apixaban (ELIQUIS) 5 MG TABS tablet; Take 1 tablet (5 mg total) by mouth 2 (two) times daily.  Dispense: 180 tablet; Refill: 1 - CBC with Differential/Platelet - CMP14+EGFR  4. Mixed hyperlipidemia - Well controlled on current regimen - continue healthy diet and exercise  - rosuvastatin (CRESTOR) 5 MG tablet; Take 0.5 tablets (2.5 mg total) by mouth every Monday, Wednesday, and Friday at 6 PM.  Dispense: 18 tablet; Refill: 1 - CMP14+EGFR - Lipid panel  5. Venous stasis of lower extremity - continue to wear compression socks - continue lasix as prescribed - added triamcinolone ointment - furosemide (LASIX) 20 MG tablet; TAKE 1 TABLET DAILY AS NEEDED FOR FLUID  Dispense: 90 tablet; Refill: 1 - triamcinolone ointment (KENALOG) 0.1 %; Apply 1 application topically 2 (two) times daily.  Dispense: 454 g; Refill: 0 - Ambulatory referral to Vascular Surgery  6. Chronic venous insufficiency - continue to wear compression socks - elevate feet when at rest - Ambulatory referral to Vascular Surgery  7. Impacted cerumen of right ear - education about debrox drops provided   Follow-up: Return in about 6 months  (around 04/18/2021) for follow-up of chronic medication conditions.   Lucile Crater, NP Student  I personally was present during the history, physical exam, and medical decision-making activities of this service and have verified that the service and findings are accurately documented in the nurse practitioner student's note.  Hendricks Limes, MSN, APRN, FNP-C [facility]   Subjective:  Patient ID: [name], male    DOB: [birth year]  Age: 87 y.o. MRN: [id number]  Patient Care Team: [name], FNP as PCP - General (Family Medicine) [name], MD as Consulting Physician (Orthopedic Surgery)   CC:  Chief Complaint  Patient presents with   Annual Exam   Foot Pain    Bilateral foot pain that has been on going.     HPI [name] presents for annual physical and leg pain.  He has recurrent cellulitis in his bilateral lower extremities. He state he has right ankle pain. He wears compression socks daily and states he cleans them with soap and water. He is not sure what medications he is on. Obtained list from his pharmacy.  Occupation: farmer, Marital status: divorced, Substance use: none Diet: regular, Exercise: active lifestyle as a farmer, uses a cane Last eye exam: >10 years, states he does not have vision problems Last dental exam: >10 years, edentulous with dentures PSA: never Immunizations: Flu Vaccine: declined Tdap Vaccine: declined  Shingrix Vaccine: declined  COVID-19 Vaccine: up to date, declined booster Pneumonia Vaccine: up to date  Advanced Directives Patient does not know if he has have advanced directives including DNR, living will, healthcare power of attorney, financial  power of attorney, and MOST form. He does not have a copy in the electronic medical record.   DEPRESSION SCREENING PHQ 2/9 Scores 10/19/2020 10/23/2019 09/16/2019 08/26/2019 02/09/2019 07/10/2018 03/05/2018  PHQ - 2 Score 0 0 0 0 0 0 0  PHQ- 9 Score 0 - - - - - -     A-Fib: new diagnosis earlier this year. Patient only took Eliquis x1 month. He is not sure why he did not continue to get further refills, he thought he was. According to pharmacy records, he only filled Eliquis #60 one time in March 2021.   Venous insufficiency: patient reports erythema to his legs that has not changed. His swelling is down in the morning and gets worse throughout the day. He does wear compression hose daily and washes them out with soap and water in the evenings. He saw Dr. Trula Slade, vascular, once a couple of years ago, but did not go back for follow-up for unknown reasons.    Review of Systems  Constitutional:  Negative for chills, fever, malaise/fatigue and weight loss.  HENT:  Negative for congestion, ear discharge, ear pain and hearing loss.   Eyes:  Negative for blurred vision, double vision, pain, discharge and redness.  Respiratory:  Negative for cough, sputum production, shortness of breath and wheezing.   Cardiovascular:  Positive for leg swelling (bilateral lower ext.). Negative for chest pain and palpitations.  Gastrointestinal:  Negative for abdominal pain, heartburn, nausea and vomiting.  Genitourinary:  Negative for dysuria, frequency and urgency.  Musculoskeletal:  Positive for joint pain (right ankle) and myalgias (feet bilaterally). Negative for falls.  Skin:        Redness and dry skin  Neurological:  Negative for dizziness, tingling, tremors, seizures, weakness and headaches.  Endo/Heme/Allergies:  Bruises/bleeds easily.  Psychiatric/Behavioral:  Negative for depression, hallucinations, memory loss, substance abuse and suicidal ideas. The patient is not nervous/anxious and does not have insomnia.     Current Outpatient Medications:    apixaban (ELIQUIS) 5 MG TABS tablet, Take 1 tablet (5 mg total) by mouth 2 (two) times daily., Disp: 60 tablet, Rfl: 2   fish oil-omega-3 fatty acids 1000 MG capsule, Take 2 g by mouth daily., Disp: , Rfl:    furosemide  (LASIX) 20 MG tablet, TAKE 1 TABLET DAILY AS NEEDED FOR FLUID, Disp: 30 tablet, Rfl: 0   lisinopril (ZESTRIL) 40 MG tablet, TAKE 1 TABLET DAILY, Disp: 90 tablet, Rfl: 0   polyethylene glycol (MIRALAX / GLYCOLAX) 17 g packet, Take 17 g by mouth daily as needed for mild constipation., Disp: 14 each, Rfl: 0   rosuvastatin (CRESTOR) 5 MG tablet, Take 0.5 tab on Monday, Wednesday and Friday evenings (Patient taking differently: Take 2.5 mg by mouth See admin instructions. Take on  Monday, Wednesday and Friday evenings), Disp: 45 tablet, Rfl: 1  No Known Allergies  Past Medical History:  Diagnosis Date   Arthritis    Atrial fibrillation ([place]) 04/28/2020   Hyperlipidemia    Hypertension    Skin cancer    Face    Past Surgical History:  Procedure Laterality Date   APPENDECTOMY     JOINT REPLACEMENT Left    hip    SKIN LESION EXCISION     Face   TOTAL HIP ARTHROPLASTY Right 05/11/2020   Procedure: TOTAL HIP ARTHROPLASTY ANTERIOR APPROACH;  Surgeon: [name], MD;  Location: [facility];  Service: Orthopedics;  Laterality: Right;  187mn    Family History  Problem Relation Age of Onset  Cancer Mother    Stroke Father    Dementia Brother    Healthy Daughter    Healthy Son    Healthy Son     Social History   Socioeconomic History   Marital status: Divorced    Spouse name: Not on file   Number of children: 3   Years of education: 11   Highest education level: 11th grade  Occupational History   Not on file  Tobacco Use   Smoking status: Never   Smokeless tobacco: Never  Vaping Use   Vaping Use: Never used  Substance and Sexual Activity   Alcohol use: No   Drug use: No   Sexual activity: Not on file  Other Topics Concern   Not on file  Social History Narrative   Not on file   Social Determinants of Health   Financial Resource Strain: Not on file  Food Insecurity: Not on file  Transportation Needs: Not on file  Physical Activity: Not on file  Stress: Not on file   Social Connections: Not on file  Intimate Partner Violence: Not on file      Objective:    BP (!) 144/82 (BP Location: Left Arm)   Pulse 70   Temp (!) 97.4 F (36.3 C) (Temporal)   Ht 6' 1"  (1.854 m)   Wt 109 kg   SpO2 98%   BMI 31.72 kg/m   Wt Readings from Last 3 Encounters:  10/19/20 240 lb 6.4 oz (109 kg)  05/11/20 246 lb 0.5 oz (111.6 kg)  05/04/20 246 lb (111.6 kg)    Physical Exam Constitutional:      General: He is not in acute distress.    Appearance: He is obese. He is not ill-appearing, toxic-appearing or diaphoretic.  HENT:     Head: Normocephalic and atraumatic.     Right Ear: There is impacted cerumen.     Left Ear: Tympanic membrane, ear canal and external ear normal.     Nose: Nose normal. No congestion or rhinorrhea.     Mouth/Throat:     Mouth: Mucous membranes are moist.     Pharynx: Oropharynx is clear. No oropharyngeal exudate or posterior oropharyngeal erythema.  Eyes:     Extraocular Movements: Extraocular movements intact.     Conjunctiva/sclera: Conjunctivae normal.     Pupils: Pupils are equal, round, and reactive to light.  Neck:     Vascular: No carotid bruit.  Cardiovascular:     Rate and Rhythm: Normal rate. Rhythm irregular.     Pulses:          Radial pulses are 2+ on the right side and 2+ on the left side.       Popliteal pulses are 1+ on the right side and 1+ on the left side.       Dorsalis pedis pulses are 1+ on the right side and 1+ on the left side.       Posterior tibial pulses are 1+ on the right side and 1+ on the left side.     Heart sounds: Normal heart sounds. No murmur heard. Pulmonary:     Effort: Pulmonary effort is normal.     Breath sounds: Normal breath sounds. No wheezing or rhonchi.  Abdominal:     General: Bowel sounds are normal. There is no distension.     Palpations: Abdomen is soft.     Tenderness: There is no abdominal tenderness.     Hernia: No hernia is present.  Musculoskeletal:  General:  Tenderness present.     Cervical back: Normal range of motion.     Right lower leg: 2+ Edema present.     Left lower leg: 2+ Edema present.  Skin:    General: Skin is warm and dry.     Capillary Refill: Capillary refill takes more than 3 seconds.     Findings: Erythema present.  Neurological:     Mental Status: He is alert and oriented to person, place, and time. Mental status is at baseline.     Motor: No weakness.     Gait: Gait abnormal (ambulates with cane).  Psychiatric:        Mood and Affect: Mood normal.        Behavior: Behavior normal.        Thought Content: Thought content normal.        Judgment: Judgment normal.    Lab Results  Component Value Date   TSH 2.930 09/16/2019   Lab Results  Component Value Date   WBC 12.3 (H) 05/14/2020   HGB 13.1 05/14/2020   HCT 39.7 05/14/2020   MCV 97.1 05/14/2020   PLT 195 05/14/2020   Lab Results  Component Value Date   NA 134 (L) 05/13/2020   K 4.8 05/13/2020   CO2 24 05/13/2020   GLUCOSE 112 (H) 05/13/2020   BUN 16 05/13/2020   CREATININE 0.88 05/13/2020   BILITOT 1.3 (H) 05/11/2020   ALKPHOS 65 05/11/2020   AST 20 05/11/2020   ALT 13 05/11/2020   PROT 7.1 05/11/2020   ALBUMIN 4.1 05/11/2020   CALCIUM 8.8 (L) 05/13/2020   ANIONGAP 8 05/13/2020   Lab Results  Component Value Date   CHOL 184 09/16/2019   Lab Results  Component Value Date   HDL 66 09/16/2019   Lab Results  Component Value Date   LDLCALC 103 (H) 09/16/2019   Lab Results  Component Value Date   TRIG 81 09/16/2019   Lab Results  Component Value Date   CHOLHDL 2.8 09/16/2019   Lab Results  Component Value Date   HGBA1C 5.1 03/31/2020

## 2020-10-19 NOTE — Patient Instructions (Signed)
Purchase Debrox wax removal kit over the counter. Drops (3-4) should be instilled twice daily x3 days, then the ear flushed with warm water on the 4th day.  

## 2020-10-21 ENCOUNTER — Telehealth: Payer: Self-pay | Admitting: Family Medicine

## 2020-10-21 NOTE — Telephone Encounter (Signed)
I have spoken to patient's daughter and updated her on our visit.

## 2020-10-21 NOTE — Telephone Encounter (Signed)
Pts daughter called requesting that either the provider or nurse call her to go over pts visit the other day since she could not come in with pt and he is hard of hearing.

## 2020-10-21 NOTE — Telephone Encounter (Signed)
Called daughter. No answer.

## 2020-10-26 ENCOUNTER — Telehealth: Payer: Self-pay | Admitting: Family Medicine

## 2020-10-26 ENCOUNTER — Ambulatory Visit (INDEPENDENT_AMBULATORY_CARE_PROVIDER_SITE_OTHER): Payer: Medicare Other | Admitting: Nurse Practitioner

## 2020-10-26 ENCOUNTER — Other Ambulatory Visit: Payer: Self-pay

## 2020-10-26 VITALS — BP 164/74 | HR 68 | Temp 98.0°F | Ht 73.0 in | Wt 242.0 lb

## 2020-10-26 DIAGNOSIS — L03116 Cellulitis of left lower limb: Secondary | ICD-10-CM | POA: Insufficient documentation

## 2020-10-26 DIAGNOSIS — I482 Chronic atrial fibrillation, unspecified: Secondary | ICD-10-CM

## 2020-10-26 DIAGNOSIS — L03115 Cellulitis of right lower limb: Secondary | ICD-10-CM | POA: Diagnosis not present

## 2020-10-26 MED ORDER — CEFTRIAXONE SODIUM 1 G IJ SOLR
1.0000 g | Freq: Once | INTRAMUSCULAR | Status: AC
  Administered 2020-10-26: 1 g via INTRAMUSCULAR

## 2020-10-26 MED ORDER — CEPHALEXIN 500 MG PO CAPS: 500.0000 mg | ORAL_CAPSULE | Freq: Two times a day (BID) | ORAL | 0 refills | Status: DC

## 2020-10-26 NOTE — Patient Instructions (Signed)
Return in 3 to 4 days.   Cellulitis, Adult Cellulitis is a skin infection. The infected area is often warm, red, swollen, and sore. It occurs most often in the arms and lower legs. It is very important to get treated for this condition. What are the causes? This condition is caused by bacteria. The bacteria enter through a break in the skin, such as a cut, burn, insect bite, open sore, or crack. What increases the risk? This condition is more likely to occur in people who: Have a weak body defense system (immune system). Have open cuts, burns, bites, or scrapes on the skin. Are older than 85 years of age. Have a blood sugar problem (diabetes). Have a long-lasting (chronic) liver disease (cirrhosis) or kidney disease. Are very overweight (obese). Have a skin problem, such as: Itchy rash (eczema). Slow movement of blood in the veins (venous stasis). Fluid buildup below the skin (edema). Have been treated with high-energy rays (radiation). Use IV drugs. What are the signs or symptoms? Symptoms of this condition include: Skin that is: Red. Streaking. Spotting. Swollen. Sore or painful when you touch it. Warm. A fever. Chills. Blisters. How is this diagnosed? This condition is diagnosed based on: Medical history. Physical exam. Blood tests. Imaging tests. How is this treated? Treatment for this condition may include: Medicines to treat infections or allergies. Home care, such as: Rest. Placing cold or warm cloths (compresses) on the skin. Hospital care, if the condition is very bad. Follow these instructions at home: Medicines Take over-the-counter and prescription medicines only as told by your doctor. If you were prescribed an antibiotic medicine, take it as told by your doctor. Do not stop taking it even if you start to feel better. General instructions  Drink enough fluid to keep your pee (urine) pale yellow. Do not touch or rub the infected area. Raise  (elevate) the infected area above the level of your heart while you are sitting or lying down. Place cold or warm cloths on the area as told by your doctor. Keep all follow-up visits as told by your doctor. This is important. Contact a doctor if: You have a fever. You do not start to get better after 1-2 days of treatment. Your bone or joint under the infected area starts to hurt after the skin has healed. Your infection comes back. This can happen in the same area or another area. You have a swollen bump in the area. You have new symptoms. You feel ill and have muscle aches and pains. Get help right away if: Your symptoms get worse. You feel very sleepy. You throw up (vomit) or have watery poop (diarrhea) for a long time. You see red streaks coming from the area. Your red area gets larger. Your red area turns dark in color. These symptoms may represent a serious problem that is an emergency. Do not wait to see if the symptoms will go away. Get medical help right away. Call your local emergency services (911 in the [place] Do not drive yourself to the hospital. Summary Cellulitis is a skin infection. The area is often warm, red, swollen, and sore. This condition is treated with medicines, rest, and cold and warm cloths. Take all medicines only as told by your doctor. Tell your doctor if symptoms do not start to get better after 1-2 days of treatment. This information is not intended to replace advice given to you by your health care provider. Make sure you discuss any questions you have with your  health care provider. Document Revised: 06/13/2017 Document Reviewed: 06/13/2017 Elsevier Patient Education  [place].

## 2020-10-26 NOTE — Assessment & Plan Note (Signed)
-  Symptoms not well controlled started Keflex -1 g Rocephin given in clinic -Follow-up in 3 days.

## 2020-10-26 NOTE — Addendum Note (Signed)
Addended byCarrolyn Leigh on: 10/26/2020 12:07 PM   Modules accepted: Orders

## 2020-10-26 NOTE — Progress Notes (Signed)
Acute Office Visit  Subjective:    Patient ID: [name], male    DOB: May 11, 1929, 87 y.o.   MRN: [id number]  Chief Complaint  Patient presents with   Cellulitis    HPI Patient is in today for Wound Check: Patient presents for wound check. Patient has a ulceration due to venous stasis wound which is located on the right footCurrent symptoms: blistering erythema: moderate  . Symptoms began a few weeks ago. Pain is rated 4/10. Interventions to date: none.   Patient presents with bilateral lower extremity cellulitis.  Patient was previously seen few days prior for venous stasis and referred to vascular surgery for reevaluation.  Patient's skin is red, painful with stage II skin tears and weeping.  No fever or other symptoms associated.    Past Medical History:  Diagnosis Date   Arthritis    Atrial fibrillation ([place]) 04/28/2020   Hyperlipidemia    Hypertension    Skin cancer    Face    Past Surgical History:  Procedure Laterality Date   APPENDECTOMY     JOINT REPLACEMENT Left    hip    SKIN LESION EXCISION     Face   TOTAL HIP ARTHROPLASTY Right 05/11/2020   Procedure: TOTAL HIP ARTHROPLASTY ANTERIOR APPROACH;  Surgeon: [name], MD;  Location: [facility];  Service: Orthopedics;  Laterality: Right;  155mn    Family History  Problem Relation Age of Onset   Cancer Mother    Stroke Father    Dementia Brother    Healthy Daughter    Healthy Son    Healthy Son     Social History   Socioeconomic History   Marital status: Divorced    Spouse name: Not on file   Number of children: 3   Years of education: 11   Highest education level: 11th grade  Occupational History   Not on file  Tobacco Use   Smoking status: Never   Smokeless tobacco: Never  Vaping Use   Vaping Use: Never used  Substance and Sexual Activity   Alcohol use: No   Drug use: No   Sexual activity: Not on file  Other Topics Concern   Not on file  Social History Narrative   Not on file    Social Determinants of Health   Financial Resource Strain: Not on file  Food Insecurity: Not on file  Transportation Needs: Not on file  Physical Activity: Not on file  Stress: Not on file  Social Connections: Not on file  Intimate Partner Violence: Not on file    Outpatient Medications Prior to Visit  Medication Sig Dispense Refill   apixaban (ELIQUIS) 5 MG TABS tablet Take 1 tablet (5 mg total) by mouth 2 (two) times daily. 180 tablet 1   fish oil-omega-3 fatty acids 1000 MG capsule Take 2 g by mouth daily.     furosemide (LASIX) 20 MG tablet TAKE 1 TABLET DAILY AS NEEDED FOR FLUID 90 tablet 1   lisinopril (ZESTRIL) 40 MG tablet Take 1 tablet (40 mg total) by mouth daily. 90 tablet 1   polyethylene glycol (MIRALAX / GLYCOLAX) 17 g packet Take 17 g by mouth daily as needed for mild constipation. 14 each 0   rosuvastatin (CRESTOR) 5 MG tablet Take 0.5 tablets (2.5 mg total) by mouth every Monday, Wednesday, and Friday at 6 PM. 18 tablet 1   triamcinolone ointment (KENALOG) 0.1 % Apply 1 application topically 2 (two) times daily. 454 g 0   No  facility-administered medications prior to visit.    No Known Allergies  Review of Systems  Constitutional: Negative.   HENT: Negative.    Eyes: Negative.   Respiratory: Negative.    Skin:  Positive for color change and wound.  All other systems reviewed and are negative.     Objective:    Physical Exam Vitals and nursing note reviewed.  Constitutional:      Appearance: Normal appearance.  HENT:     Head: Normocephalic.     Nose: Nose normal.  Eyes:     Conjunctiva/sclera: Conjunctivae normal.  Cardiovascular:     Rate and Rhythm: Normal rate and regular rhythm.     Pulses: Normal pulses.     Heart sounds: Normal heart sounds.  Pulmonary:     Effort: Pulmonary effort is normal.     Breath sounds: Normal breath sounds.  Abdominal:     General: Bowel sounds are normal.  Skin:    Coloration: Skin is pale.     Findings:  Erythema present.          Comments: Skin tear/wound   Neurological:     Mental Status: He is alert and oriented to person, place, and time.    BP (!) 164/74   Pulse 68   Temp 98 F (36.7 C) (Temporal)   Ht _0  (1.854 m)   Wt 242 lb (109.8 kg)   SpO2 99%   BMI 31.93 kg/m  Wt Readings from Last 3 Encounters:  10/26/20 242 lb (109.8 kg)  10/19/20 240 lb 6.4 oz (109 kg)  05/11/20 246 lb 0.5 oz (111.6 kg)    There are no preventive care reminders to display for this patient.  There are no preventive care reminders to display for this patient.   Lab Results  Component Value Date   TSH 2.930 09/16/2019   Lab Results  Component Value Date   WBC 7.5 10/19/2020   HGB 15.1 10/19/2020   HCT 43.5 10/19/2020   MCV 89 10/19/2020   PLT 232 10/19/2020   Lab Results  Component Value Date   NA 139 10/19/2020   K 4.9 10/19/2020   CO2 25 10/19/2020   GLUCOSE 85 10/19/2020   BUN 14 10/19/2020   CREATININE 1.09 10/19/2020   BILITOT 0.6 10/19/2020   ALKPHOS 80 10/19/2020   AST 13 10/19/2020   ALT 8 10/19/2020   PROT 5.9 (L) 10/19/2020   ALBUMIN 3.8 10/19/2020   CALCIUM 9.4 10/19/2020   ANIONGAP 8 05/13/2020   EGFR 65 10/19/2020   Lab Results  Component Value Date   CHOL 151 10/19/2020   Lab Results  Component Value Date   HDL 63 10/19/2020   Lab Results  Component Value Date   LDLCALC 73 10/19/2020   Lab Results  Component Value Date   TRIG 80 10/19/2020   Lab Results  Component Value Date   CHOLHDL 2.4 10/19/2020   Lab Results  Component Value Date   HGBA1C 5.1 03/31/2020       Assessment & Plan:   Problem List Items Addressed This Visit       Other   Cellulitis of left lower extremity    -Symptoms not well controlled started Keflex -1 g Rocephin given in clinic -Follow-up in 3 days.       Cellulitis of right lower extremity - Primary    -Symptoms not well controlled started Keflex -1 g Rocephin given in clinic -Follow-up in 3 days.  Relevant Medications   cephALEXin (KEFLEX) 500 MG capsule     Meds ordered this encounter  Medications   cephALEXin (KEFLEX) 500 MG capsule    Sig: Take 1 capsule (500 mg total) by mouth 2 (two) times daily.    Dispense:  14 capsule    Refill:  0    Order Specific Question:   Supervising Provider    Answer:   Janora Norlander [[id number]]     Ivy Lynn, NP

## 2020-10-26 NOTE — Telephone Encounter (Signed)
Needs CCM referral to see Almyra Free for prescription assistance. Please provide samples to get him to an appointment.

## 2020-10-27 NOTE — Telephone Encounter (Signed)
Well he won't qualify for program if he hasn't spent the 3% out of pocket   Maybe refer to [email or web address] to see if he qualifies for extra help/medicaid

## 2020-10-27 NOTE — Telephone Encounter (Signed)
Daughter aware and states that he does not bring in any money.  He gets around $600 a month for social security.  Please advise

## 2020-10-27 NOTE — Telephone Encounter (Signed)
Please schedule patient an appointment so we can talk about switching him over to Coumadin.  Referral placed to [facility].

## 2020-10-27 NOTE — Telephone Encounter (Signed)
Daughter aware. Samples up front.

## 2020-10-27 NOTE — Telephone Encounter (Signed)
Not real sure how to set this referral up tried and can not get it to pull up could you please help with this.

## 2020-10-27 NOTE — Telephone Encounter (Signed)
Nothing else "cheaper" We could switch to warfarin but frequent monitoring/labs/etc  Eliquis patient assistance is very hard to get They must spend 3% of the [place] (and have proof of this and all financials docs) to be approved Ex. If they bring in 30,000 per year--they have to have proof they have spent $900 in prescription medications alone

## 2020-10-31 ENCOUNTER — Encounter: Payer: Self-pay | Admitting: Nurse Practitioner

## 2020-10-31 ENCOUNTER — Telehealth: Payer: Self-pay

## 2020-10-31 ENCOUNTER — Other Ambulatory Visit: Payer: Self-pay

## 2020-10-31 ENCOUNTER — Ambulatory Visit (INDEPENDENT_AMBULATORY_CARE_PROVIDER_SITE_OTHER): Payer: Medicare Other | Admitting: Nurse Practitioner

## 2020-10-31 VITALS — BP 175/92 | HR 65 | Temp 96.6°F | Ht 73.0 in | Wt 243.0 lb

## 2020-10-31 DIAGNOSIS — L03116 Cellulitis of left lower limb: Secondary | ICD-10-CM | POA: Diagnosis not present

## 2020-10-31 DIAGNOSIS — L03115 Cellulitis of right lower limb: Secondary | ICD-10-CM

## 2020-10-31 NOTE — Assessment & Plan Note (Signed)
Improved bilateral lower extremity cellulitis.  Continue and complete antibiotic as prescribed.  Waiting on vascular surgery/wound care center referral.  Patient knows to follow-up with worsening unresolved symptoms.

## 2020-10-31 NOTE — Assessment & Plan Note (Signed)
CoxImproved bilateral lower extremity cellulitis.  Continue and complete antibiotic as prescribed.  Waiting on vascular surgery/wound care center referral.  Patient knows to follow-up with worsening unresolved symptoms.

## 2020-10-31 NOTE — Progress Notes (Signed)
Acute Office Visit  Subjective:    Patient ID: [name], male    DOB: October 22, 1929, 87 y.o.   MRN: [id number]  Chief Complaint  Patient presents with   Cellulitis    HPI Patient is in today for follow-up 3 days bilateral lower extremity cellulitis.  Leg gradually healing as expected, redness and swelling reduced, no fever, pain, or signs of worsening infection. Patient is currently on taking antibiotic with no side effects.  Waiting on wound center and vascular surgery referral.  Past Medical History:  Diagnosis Date   Arthritis    Atrial fibrillation ([place]) 04/28/2020   Hyperlipidemia    Hypertension    Skin cancer    Face    Past Surgical History:  Procedure Laterality Date   APPENDECTOMY     JOINT REPLACEMENT Left    hip    SKIN LESION EXCISION     Face   TOTAL HIP ARTHROPLASTY Right 05/11/2020   Procedure: TOTAL HIP ARTHROPLASTY ANTERIOR APPROACH;  Surgeon: [name], MD;  Location: [facility];  Service: Orthopedics;  Laterality: Right;  141mn    Family History  Problem Relation Age of Onset   Cancer Mother    Stroke Father    Dementia Brother    Healthy Daughter    Healthy Son    Healthy Son     Social History   Socioeconomic History   Marital status: Divorced    Spouse name: Not on file   Number of children: 3   Years of education: 11   Highest education level: 11th grade  Occupational History   Not on file  Tobacco Use   Smoking status: Never   Smokeless tobacco: Never  Vaping Use   Vaping Use: Never used  Substance and Sexual Activity   Alcohol use: No   Drug use: No   Sexual activity: Not on file  Other Topics Concern   Not on file  Social History Narrative   Not on file   Social Determinants of Health   Financial Resource Strain: Not on file  Food Insecurity: Not on file  Transportation Needs: Not on file  Physical Activity: Not on file  Stress: Not on file  Social Connections: Not on file  Intimate Partner Violence: Not on  file    Outpatient Medications Prior to Visit  Medication Sig Dispense Refill   apixaban (ELIQUIS) 5 MG TABS tablet Take 1 tablet (5 mg total) by mouth 2 (two) times daily. 180 tablet 1   cephALEXin (KEFLEX) 500 MG capsule Take 1 capsule (500 mg total) by mouth 2 (two) times daily. 14 capsule 0   fish oil-omega-3 fatty acids 1000 MG capsule Take 2 g by mouth daily.     furosemide (LASIX) 20 MG tablet TAKE 1 TABLET DAILY AS NEEDED FOR FLUID 90 tablet 1   lisinopril (ZESTRIL) 40 MG tablet Take 1 tablet (40 mg total) by mouth daily. 90 tablet 1   polyethylene glycol (MIRALAX / GLYCOLAX) 17 g packet Take 17 g by mouth daily as needed for mild constipation. 14 each 0   rosuvastatin (CRESTOR) 5 MG tablet Take 0.5 tablets (2.5 mg total) by mouth every Monday, Wednesday, and Friday at 6 PM. 18 tablet 1   triamcinolone ointment (KENALOG) 0.1 % Apply 1 application topically 2 (two) times daily. 454 g 0   No facility-administered medications prior to visit.    No Known Allergies  Review of Systems  Constitutional: Negative.   HENT: Negative.    Respiratory:  Negative.    Cardiovascular: Negative.   Genitourinary: Negative.   Skin:  Positive for color change and wound.  All other systems reviewed and are negative.     Objective:    Physical Exam Vitals and nursing note reviewed.  Constitutional:      Appearance: Normal appearance.  HENT:     Head: Normocephalic.     Nose: Nose normal.  Eyes:     Conjunctiva/sclera: Conjunctivae normal.  Cardiovascular:     Rate and Rhythm: Normal rate and regular rhythm.  Pulmonary:     Effort: Pulmonary effort is normal.     Breath sounds: Normal breath sounds.  Abdominal:     General: Bowel sounds are normal.  Skin:    General: Skin is warm.     Findings: Erythema and wound present.          Comments: Slight weeping bilaterally Extremity wound   Neurological:     Mental Status: He is alert and oriented to person, place, and time.    BP  (!) 175/92   Pulse 65   Temp (!) 96.6 F (35.9 C)   Ht 6' 1" (1.854 m)   Wt 243 lb (110.2 kg)   SpO2 100%   BMI 32.06 kg/m  Wt Readings from Last 3 Encounters:  10/31/20 243 lb (110.2 kg)  10/26/20 242 lb (109.8 kg)  10/19/20 240 lb 6.4 oz (109 kg)    There are no preventive care reminders to display for this patient.  There are no preventive care reminders to display for this patient.   Lab Results  Component Value Date   TSH 2.930 09/16/2019   Lab Results  Component Value Date   WBC 7.5 10/19/2020   HGB 15.1 10/19/2020   HCT 43.5 10/19/2020   MCV 89 10/19/2020   PLT 232 10/19/2020   Lab Results  Component Value Date   NA 139 10/19/2020   K 4.9 10/19/2020   CO2 25 10/19/2020   GLUCOSE 85 10/19/2020   BUN 14 10/19/2020   CREATININE 1.09 10/19/2020   BILITOT 0.6 10/19/2020   ALKPHOS 80 10/19/2020   AST 13 10/19/2020   ALT 8 10/19/2020   PROT 5.9 (L) 10/19/2020   ALBUMIN 3.8 10/19/2020   CALCIUM 9.4 10/19/2020   ANIONGAP 8 05/13/2020   EGFR 65 10/19/2020   Lab Results  Component Value Date   CHOL 151 10/19/2020   Lab Results  Component Value Date   HDL 63 10/19/2020   Lab Results  Component Value Date   LDLCALC 73 10/19/2020   Lab Results  Component Value Date   TRIG 80 10/19/2020   Lab Results  Component Value Date   CHOLHDL 2.4 10/19/2020   Lab Results  Component Value Date   HGBA1C 5.1 03/31/2020       Assessment & Plan:   Problem List Items Addressed This Visit       Other   Cellulitis of left lower extremity - Primary    CoxImproved bilateral lower extremity cellulitis.  Continue and complete antibiotic as prescribed.  Waiting on vascular surgery/wound care center referral.  Patient knows to follow-up with worsening unresolved symptoms.      Relevant Orders   AMB referral to wound care center   Cellulitis of right lower extremity    Improved bilateral lower extremity cellulitis.  Continue and complete antibiotic as  prescribed.  Waiting on vascular surgery/wound care center referral.  Patient knows to follow-up with worsening unresolved symptoms.  Relevant Orders   AMB referral to wound care center     No orders of the defined types were placed in this encounter.    Ivy Lynn, NP

## 2020-10-31 NOTE — Patient Instructions (Signed)

## 2020-10-31 NOTE — Chronic Care Management (AMB) (Signed)
  Chronic Care Management   Note  10/31/2020 Name: [name] MRN: [id number] DOB: 1930-01-15  [name] is a 87 y.o. year old male who is a primary care patient of [name], FNP. I reached out to [name] by phone today in response to a referral sent by [name] PCP.  [name] was given information about Chronic Care Management services today including:  CCM service includes personalized support from designated clinical staff supervised by his physician, including individualized plan of care and coordination with other care providers 24/7 contact phone numbers for assistance for urgent and routine care needs. Service will only be billed when office clinical staff spend 20 minutes or more in a month to coordinate care. Only one practitioner may furnish and bill the service in a calendar month. The patient may stop CCM services at any time (effective at the end of the month) by phone call to the office staff. The patient is responsible for co-pay (up to 20% after annual deductible is met) if co-pay is required by the individual health plan.   Patient agreed to services and verbal consent obtained.   Follow up plan: Telephone appointment with care management team member scheduled for:11/01/2020  [name], [place], [place], [place] [address] Direct Dial: [phone] [name] Website: [email or web address]

## 2020-11-01 ENCOUNTER — Ambulatory Visit (INDEPENDENT_AMBULATORY_CARE_PROVIDER_SITE_OTHER): Payer: Medicare Other | Admitting: *Deleted

## 2020-11-01 DIAGNOSIS — I10 Essential (primary) hypertension: Secondary | ICD-10-CM

## 2020-11-01 DIAGNOSIS — I482 Chronic atrial fibrillation, unspecified: Secondary | ICD-10-CM

## 2020-11-01 NOTE — Patient Instructions (Signed)
Visit Information   PATIENT GOALS:   Goals Addressed             This Visit's Progress    RNCM: Manage My Medicine   On track    Timeframe:  Long-Range Goal Priority:  Medium Start Date: 11/01/20                            Expected End Date: 11/01/21                       Follow Up Date 11/09/20    Work with [facility] to have medications prepackaged Take medications as directed Request refills 2-3 days before running out Complete online application for Medicare Extra Help for Prescription Drug Coverage Call RN Care Manager as needed [phone] Call [facility] [phone] for Eliquis samples if needed    Why is this important?   These steps will help you keep on track with your medicines.   Notes:         Consent to CCM Services: [name] was given information about Chronic Care Management services including:  CCM service includes personalized support from designated clinical staff supervised by his physician, including individualized plan of care and coordination with other care providers 24/7 contact phone numbers for assistance for urgent and routine care needs. Service will only be billed when office clinical staff spend 20 minutes or more in a month to coordinate care. Only one practitioner may furnish and bill the service in a calendar month. The patient may stop CCM services at any time (effective at the end of the month) by phone call to the office staff. The patient will be responsible for cost sharing (co-pay) of up to 20% of the service fee (after annual deductible is met).  Patient agreed to services and verbal consent obtained.   Patient verbalizes understanding of instructions provided today and agrees to view in [place].    Plan:Telephone follow up appointment with care management team member scheduled for:  11/09/20 with RNCM and The patient has been provided with contact information for the care management team and has been advised to call with any  health related questions or concerns.   Chong Sicilian, BSN, RN-BC Embedded Chronic Care Manager [facility] [place] [facility] Direct Dial: [phone]   CLINICAL CARE PLAN: Patient Care Plan: [facility] Care Plan     Problem Identified: Chronic Disease Management Needs   Priority: Medium  Onset Date: 11/01/2020     Long-Range Goal: Work with [facility] Regarding Care Management and Care Coordination Associated with HTN, Afib, and Prescription Assistance Needs   Start Date: 11/01/2020  Expected End Date: 11/01/2021  This Visit's Progress: On track  Priority: Medium  Note:   Current Barriers:  Care Coordination needs related to Medication procurement  Chronic Disease Management support and education needs related to Atrial Fibrillation and HTN Financial Constraints.   RNCM Clinical Goal(s):  Patient will verbalize understanding of plan for management of medication affordability  continue to work with RN Care Manager to address care management and care coordination needs related to Atrial Fibrillation and HTN  through collaboration with RN Care manager, provider, and care team.   Interventions: 1:1 collaboration with primary care provider regarding development and update of comprehensive plan of care as evidenced by provider attestation and co-signature Inter-disciplinary care team collaboration (see longitudinal plan of care) Evaluation of current treatment plan related to  self  management and patient's adherence to plan as established by provider Provided with RNCM contact information and encouraged to reach out as needed   SDOH Barriers (Status: Goal on track: YES.)  Patient interviewed and SDOH assessment performed        SDOH Interventions    Flowsheet Row Most Recent Value  SDOH Interventions   Financial Strain Interventions Other (Comment)  [Provided with information about Medicare Extra Help and application hyperlink]     Patient interviewed and  appropriate assessments performed Reviewed and discussed medications and cost Discussed income Discussed daughter's plan to have [place] his medications to aid in compliance Reviewed recent communication between PCP and PharmD regarding Afib management with Coumadin vs Eliquis due to cost of Eliquis.  Provided verbal education on Coumadin and requirements for frequent lab monitoring as well as food and drug restrictions/interactions. Daughter doesn't think Coumadin is a good option for patient and would like assistance with Eliquis. Provided daughter with verbal information about Eliquis prescription assistance through [employer] and qualification requirements and that the assistance is only valid through the end of the calendar year.  Provided daughter with verbal information on Medicare Extra Help for Prescription Drug Coverage.  Discussed program and emailed link for more information and link for online application Verified that daughter is comfortable with filling out application online Questioned about family/social support Son lives next door and is available for assistance Daughter lives 2 hours away but is also available and involved Discussed transportation Patient drives locally. Family assists with transport outside of local area. Discussed plans with patient for ongoing care management follow up and provided patient with direct contact information for care management team   Patient Goals/Self-Care Activities: Patient will self administer medications as prescribed as evidenced by self report/primary caregiver report  Patient will call pharmacy for medication refills as evidenced by patient report and review of pharmacy fill history as appropriate Patient will continue to perform ADL's independently as evidenced by patient/caregiver report Patient will call provider office for new concerns or questions as evidenced by review of documented incoming telephone call notes and  patient report

## 2020-11-01 NOTE — Chronic Care Management (AMB) (Addendum)
Chronic Care Management   CCM RN Visit Note  11/01/2020 Name: [name] MRN: [id number] DOB: 1929/07/17  Subjective: [name] is a 87 y.o. year old male who is a primary care patient of [name], FNP. The care management team was consulted for assistance with disease management and care coordination needs.    Engaged with patient's daughter, [name], by telephone  for initial visit in response to provider referral for case management and/or care coordination services.   Consent to Services:  The patient was given information about Chronic Care Management services, agreed to services, and gave verbal consent prior to initiation of services.  Please see initial visit note for detailed documentation.   Patient agreed to services and verbal consent obtained.   Assessment: Review of patient past medical history, allergies, medications, health status, including review of consultants reports, laboratory and other test data, was performed as part of comprehensive evaluation and provision of chronic care management services.   SDOH (Social Determinants of Health) assessments and interventions performed:  SDOH Interventions    Flowsheet Row Most Recent Value  SDOH Interventions   Financial Strain Interventions Other (Comment)  [Provided with information about Medicare Extra Help and application hyperlink]        CCM Care Plan  No Known Allergies  Outpatient Encounter Medications as of 11/01/2020  Medication Sig   apixaban (ELIQUIS) 5 MG TABS tablet Take 1 tablet (5 mg total) by mouth 2 (two) times daily.   cephALEXin (KEFLEX) 500 MG capsule Take 1 capsule (500 mg total) by mouth 2 (two) times daily.   fish oil-omega-3 fatty acids 1000 MG capsule Take 2 g by mouth daily.   furosemide (LASIX) 20 MG tablet TAKE 1 TABLET DAILY AS NEEDED FOR FLUID   lisinopril (ZESTRIL) 40 MG tablet Take 1 tablet (40 mg total) by mouth daily.   polyethylene glycol (MIRALAX / GLYCOLAX) 17 g  packet Take 17 g by mouth daily as needed for mild constipation.   rosuvastatin (CRESTOR) 5 MG tablet Take 0.5 tablets (2.5 mg total) by mouth every Monday, Wednesday, and Friday at 6 PM.   triamcinolone ointment (KENALOG) 0.1 % Apply 1 application topically 2 (two) times daily.   No facility-administered encounter medications on file as of 11/01/2020.    Patient Active Problem List   Diagnosis Date Noted   Cellulitis of left lower extremity 10/26/2020   Cellulitis of right lower extremity 10/26/2020   OA (osteoarthritis) of hip 05/11/2020   Osteoarthritis of right hip 05/11/2020   Preoperative clearance 05/04/2020   Atrial fibrillation ([place]) 04/28/2020   Venous stasis of lower extremity 10/23/2019   Obesity (BMI 30.0-34.9) 07/18/2017   Essential hypertension 08/16/2014   Hyperlipemia 08/16/2014   Peripheral edema 06/10/2012   Vitamin D deficiency 05/10/2010   Arthritis 05/10/2010    Conditions to be addressed/monitored:Atrial Fibrillation, HTN, and prescription assistance needs  Care Plan : [facility] Care Plan  Updates made by Ilean China, RN since 11/01/2020 12:00 AM     Problem: Chronic Disease Management Needs   Priority: Medium  Onset Date: 11/01/2020     Long-Range Goal: Work with [facility] Regarding Care Management and Care Coordination Associated with HTN, Afib, and Prescription Assistance Needs   Start Date: 11/01/2020  Expected End Date: 11/01/2021  This Visit's Progress: On track  Priority: Medium  Note:   Current Barriers:  Care Coordination needs related to Medication procurement  Chronic Disease Management support and education needs related to Atrial Fibrillation and HTN Financial Constraints.  RNCM Clinical Goal(s):  Patient will verbalize understanding of plan for management of medication affordability  continue to work with RN Care Manager to address care management and care coordination needs related to Atrial Fibrillation and HTN  through collaboration with  RN Care manager, provider, and care team.   Interventions: 1:1 collaboration with primary care provider regarding development and update of comprehensive plan of care as evidenced by provider attestation and co-signature Inter-disciplinary care team collaboration (see longitudinal plan of care) Evaluation of current treatment plan related to  self management and patient's adherence to plan as established by provider Provided with RNCM contact information and encouraged to reach out as needed   SDOH Barriers (Status: Goal on track: YES.)  Patient interviewed and SDOH assessment performed        SDOH Interventions    Flowsheet Row Most Recent Value  SDOH Interventions   Financial Strain Interventions Other (Comment)  [Provided with information about Medicare Extra Help and application hyperlink]     Patient interviewed and appropriate assessments performed Reviewed and discussed medications and cost Discussed income Discussed daughter's plan to have [place] his medications to aid in compliance Reviewed recent communication between PCP and PharmD regarding Afib management with Coumadin vs Eliquis due to cost of Eliquis.  Provided verbal education on Coumadin and requirements for frequent lab monitoring as well as food and drug restrictions/interactions. Daughter doesn't think Coumadin is a good option for patient and would like assistance with Eliquis. Provided daughter with verbal information about Eliquis prescription assistance through [employer]MS and qualification requirements and that the assistance is only valid through the end of the calendar year.  Provided daughter with verbal information on Medicare Extra Help for Prescription Drug Coverage.  Discussed program and emailed link for more information and link for online application Verified that daughter is comfortable with filling out application online Questioned about family/social support Son lives next door and is  available for assistance Daughter lives 2 hours away but is also available and involved Discussed transportation Patient drives locally. Family assists with transport outside of local area. Discussed plans with patient for ongoing care management follow up and provided patient with direct contact information for care management team   Patient Goals/Self-Care Activities: Patient will self administer medications as prescribed as evidenced by self report/primary caregiver report  Patient will call pharmacy for medication refills as evidenced by patient report and review of pharmacy fill history as appropriate Patient will continue to perform ADL's independently as evidenced by patient/caregiver report Patient will call provider office for new concerns or questions as evidenced by review of documented incoming telephone call notes and patient report       Plan:Telephone follow up appointment with care management team member scheduled for:  11/09/20 with RNCM and The patient has been provided with contact information for the care management team and has been advised to call with any health related questions or concerns.   Chong Sicilian, BSN, RN-BC Embedded Chronic Care Manager [facility] [place] [facility] Direct Dial: [phone]

## 2020-11-04 ENCOUNTER — Encounter: Payer: Self-pay | Admitting: Nurse Practitioner

## 2020-11-04 ENCOUNTER — Other Ambulatory Visit: Payer: Self-pay

## 2020-11-04 ENCOUNTER — Ambulatory Visit (INDEPENDENT_AMBULATORY_CARE_PROVIDER_SITE_OTHER): Payer: Medicare Other | Admitting: Nurse Practitioner

## 2020-11-04 VITALS — BP 146/82 | HR 60 | Temp 97.0°F | Ht 73.0 in | Wt 243.6 lb

## 2020-11-04 DIAGNOSIS — I482 Chronic atrial fibrillation, unspecified: Secondary | ICD-10-CM | POA: Diagnosis not present

## 2020-11-04 DIAGNOSIS — L03116 Cellulitis of left lower limb: Secondary | ICD-10-CM | POA: Diagnosis not present

## 2020-11-04 DIAGNOSIS — L03115 Cellulitis of right lower limb: Secondary | ICD-10-CM

## 2020-11-04 DIAGNOSIS — I10 Essential (primary) hypertension: Secondary | ICD-10-CM | POA: Diagnosis not present

## 2020-11-04 NOTE — Progress Notes (Signed)
Acute Office Visit  Subjective:    Patient ID: [name], male    DOB: [birth year], 87 y.o.   MRN: [id number]  Chief Complaint  Patient presents with   Cellulitis    Bilateral lower legs. Patient was seen 9/26 and here for re check.     HPI Patient is in today for follow up stasis ulcer with bilateral lower leg edema. Wound is healing as expected, re-wrapped with Uno boots. Patient has no pain or sign and symptom of infection   Past Medical History:  Diagnosis Date   Arthritis    Atrial fibrillation ([place]) 04/28/2020   Hyperlipidemia    Hypertension    Skin cancer    Face    Past Surgical History:  Procedure Laterality Date   APPENDECTOMY     JOINT REPLACEMENT Left    hip    SKIN LESION EXCISION     Face   TOTAL HIP ARTHROPLASTY Right 05/11/2020   Procedure: TOTAL HIP ARTHROPLASTY ANTERIOR APPROACH;  Surgeon: [name], MD;  Location: [facility];  Service: Orthopedics;  Laterality: Right;  147mn    Family History  Problem Relation Age of Onset   Cancer Mother    Stroke Father    Dementia Brother    Healthy Daughter    Healthy Son    Healthy Son     Social History   Socioeconomic History   Marital status: Divorced    Spouse name: Not on file   Number of children: 3   Years of education: 11   Highest education level: 11th grade  Occupational History   Not on file  Tobacco Use   Smoking status: Never   Smokeless tobacco: Never  Vaping Use   Vaping Use: Never used  Substance and Sexual Activity   Alcohol use: No   Drug use: No   Sexual activity: Not on file  Other Topics Concern   Not on file  Social History Narrative   Not on file   Social Determinants of Health   Financial Resource Strain: Medium Risk   Difficulty of Paying Living Expenses: Somewhat hard  Food Insecurity: No Food Insecurity   Worried About RCharity fundraiserin the Last Year: Never true   Ran Out of Food in the Last Year: Never true  Transportation Needs: No  Transportation Needs   Lack of Transportation (Medical): No   Lack of Transportation (Non-Medical): No  Physical Activity: Not on file  Stress: Not on file  Social Connections: Not on file  Intimate Partner Violence: Not on file    Outpatient Medications Prior to Visit  Medication Sig Dispense Refill   apixaban (ELIQUIS) 5 MG TABS tablet Take 1 tablet (5 mg total) by mouth 2 (two) times daily. 180 tablet 1   cephALEXin (KEFLEX) 500 MG capsule Take 1 capsule (500 mg total) by mouth 2 (two) times daily. 14 capsule 0   fish oil-omega-3 fatty acids 1000 MG capsule Take 2 g by mouth daily.     furosemide (LASIX) 20 MG tablet TAKE 1 TABLET DAILY AS NEEDED FOR FLUID 90 tablet 1   lisinopril (ZESTRIL) 40 MG tablet Take 1 tablet (40 mg total) by mouth daily. 90 tablet 1   polyethylene glycol (MIRALAX / GLYCOLAX) 17 g packet Take 17 g by mouth daily as needed for mild constipation. 14 each 0   rosuvastatin (CRESTOR) 5 MG tablet Take 0.5 tablets (2.5 mg total) by mouth every Monday, Wednesday, and Friday at 6 PM. 18  tablet 1   triamcinolone ointment (KENALOG) 0.1 % Apply 1 application topically 2 (two) times daily. 454 g 0   No facility-administered medications prior to visit.    No Known Allergies  Review of Systems  Constitutional: Negative.   HENT: Negative.    Respiratory: Negative.    Gastrointestinal: Negative.   Skin:  Positive for color change and wound.  All other systems reviewed and are negative.     Objective:    Physical Exam Vitals and nursing note reviewed.  Constitutional:      Appearance: Normal appearance.  HENT:     Head: Normocephalic.     Nose: Nose normal.  Eyes:     Conjunctiva/sclera: Conjunctivae normal.  Cardiovascular:     Rate and Rhythm: Normal rate and regular rhythm.  Pulmonary:     Effort: Pulmonary effort is normal.     Breath sounds: Normal breath sounds.  Abdominal:     General: Bowel sounds are normal.  Musculoskeletal:     Right lower  leg: Tenderness present. Edema present.     Left lower leg: Tenderness present. Edema present.       Legs:  Skin:    General: Skin is warm.  Neurological:     Mental Status: He is alert and oriented to person, place, and time.    BP (!) 146/82   Pulse 60   Temp (!) 97 F (36.1 C) (Temporal)   Ht 6' 1"  (1.854 m)   Wt 243 lb 9.6 oz (110.5 kg)   BMI 32.14 kg/m  Wt Readings from Last 3 Encounters:  11/04/20 243 lb 9.6 oz (110.5 kg)  10/31/20 243 lb (110.2 kg)  10/26/20 242 lb (109.8 kg)    There are no preventive care reminders to display for this patient.  There are no preventive care reminders to display for this patient.   Lab Results  Component Value Date   TSH 2.930 09/16/2019   Lab Results  Component Value Date   WBC 7.5 10/19/2020   HGB 15.1 10/19/2020   HCT 43.5 10/19/2020   MCV 89 10/19/2020   PLT 232 10/19/2020   Lab Results  Component Value Date   NA 139 10/19/2020   K 4.9 10/19/2020   CO2 25 10/19/2020   GLUCOSE 85 10/19/2020   BUN 14 10/19/2020   CREATININE 1.09 10/19/2020   BILITOT 0.6 10/19/2020   ALKPHOS 80 10/19/2020   AST 13 10/19/2020   ALT 8 10/19/2020   PROT 5.9 (L) 10/19/2020   ALBUMIN 3.8 10/19/2020   CALCIUM 9.4 10/19/2020   ANIONGAP 8 05/13/2020   EGFR 65 10/19/2020   Lab Results  Component Value Date   CHOL 151 10/19/2020   Lab Results  Component Value Date   HDL 63 10/19/2020   Lab Results  Component Value Date   LDLCALC 73 10/19/2020   Lab Results  Component Value Date   TRIG 80 10/19/2020   Lab Results  Component Value Date   CHOLHDL 2.4 10/19/2020   Lab Results  Component Value Date   HGBA1C 5.1 03/31/2020       Assessment & Plan:   Problem List Items Addressed This Visit       Other   Cellulitis of left lower extremity - Primary    Wound healing as expected, follow up in one week, keep wound care center appointment. Bilateral lower legs re-wrapped with uno boots.  Patient knows to follow up with  worsening unresolved symptoms.  Cellulitis of right lower extremity    Wound healing as expected, follow up in one week, keep wound care center appointment. Bilateral lower legs re-wrapped with uno boots.  Patient knows to follow up with worsening unresolved symptoms.        No orders of the defined types were placed in this encounter.    Ivy Lynn, NP

## 2020-11-04 NOTE — Patient Instructions (Signed)
Wound Care, Adult Taking care of your wound properly can help to prevent pain, infection, and scarring. It can also help your wound heal more quickly. Follow instructionsfrom your health care provider about how to care for your wound. Supplies needed: Soap and water. Wound cleanser. Gauze. If needed, a clean bandage (dressing) or other type of wound dressing material to cover or place in the wound. Follow your health care provider's instructions about what dressing supplies to use. Cream or ointment to apply to the wound, if told by your health care provider. How to care for your wound Cleaning the wound Ask your health care provider how to clean the wound. This may include: Using mild soap and water or a wound cleanser. Using a clean gauze to pat the wound dry after cleaning it. Do not rub or scrub the wound. Dressing care Wash your hands with soap and water for at least 20 seconds before and after you change the dressing. If soap and water are not available, use hand sanitizer. Change your dressing as told by your health care provider. This may include: Cleaning or rinsing out (irrigating) the wound. Placing a dressing over the wound or in the wound (packing). Covering the wound with an outer dressing. Leave any stitches (sutures), skin glue, or adhesive strips in place. These skin closures may need to stay in place for 2 weeks or longer. If adhesive strip edges start to loosen and curl up, you may trim the loose edges. Do not remove adhesive strips completely unless your health care provider tells you to do that. Ask your health care provider when you can leave the wound uncovered. Checking for infection Check your wound area every day for signs of infection. Check for: More redness, swelling, or pain. Fluid or blood. Warmth. Pus or a bad smell.  Follow these instructions at home Medicines If you were prescribed an antibiotic medicine, cream, or ointment, take or apply it as told by  your health care provider. Do not stop using the antibiotic even if your condition improves. If you were prescribed pain medicine, take it 30 minutes before you do any wound care or as told by your health care provider. Take over-the-counter and prescription medicines only as told by your health care provider. Eating and drinking Eat a diet that includes protein, vitamin A, vitamin C, and other nutrient-rich foods to help the wound heal. Foods rich in protein include meat, fish, eggs, dairy, beans, and nuts. Foods rich in vitamin A include carrots and dark green, leafy vegetables. Foods rich in vitamin C include citrus fruits, tomatoes, broccoli, and peppers. Drink enough fluid to keep your urine pale yellow. General instructions Do not take baths, swim, use a hot tub, or do anything that would put the wound underwater until your health care provider approves. Ask your health care provider if you may take showers. You may only be allowed to take sponge baths. Do not scratch or pick at the wound. Keep it covered as told by your health care provider. Return to your normal activities as told by your health care provider. Ask your health care provider what activities are safe for you. Protect your wound from the sun when you are outside for the first 6 months, or for as long as told by your health care provider. Cover up the scar area or apply sunscreen that has an SPF of at least 30. Do not use any products that contain nicotine or tobacco, such as cigarettes, e-cigarettes, and chewing tobacco.   These may delay wound healing. If you need help quitting, ask your health care provider. Keep all follow-up visits as told by your health care provider. This is important. Contact a health care provider if: You received a tetanus shot and you have swelling, severe pain, redness, or bleeding at the injection site. Your pain is not controlled with medicine. You have any of these signs of infection: More  redness, swelling, or pain around the wound. Fluid or blood coming from the wound. Warmth coming from the wound. Pus or a bad smell coming from the wound. A fever or chills. You are nauseous or you vomit. You are dizzy. Get help right away if: You have a red streak of skin near the area around your wound. Your wound has been closed with staples, sutures, skin glue, or adhesive strips and it begins to open up and separate. Your wound is bleeding, and the bleeding does not stop with gentle pressure. You have a rash. You faint. You have trouble breathing. These symptoms may represent a serious problem that is an emergency. Do not wait to see if the symptoms will go away. Get medical help right away. Call your local emergency services (911 in the [place] Do not drive yourself to the hospital. Summary Always wash your hands with soap and water for at least 20 seconds before and after changing your dressing. Change your dressing as told by your health care provider. To help with healing, eat foods that are rich in protein, vitamin A, vitamin C, and other nutrients. Check your wound every day for signs of infection. Contact your health care provider if you suspect that your wound is infected. This information is not intended to replace advice given to you by your health care provider. Make sure you discuss any questions you have with your healthcare provider. Document Revised: 11/07/2018 Document Reviewed: 11/07/2018 Elsevier Patient Education  2022 Elsevier Inc.  

## 2020-11-04 NOTE — Assessment & Plan Note (Signed)
Wound healing as expected, follow up in one week, keep wound care center appointment. Bilateral lower legs re-wrapped with uno boots.  Patient knows to follow up with worsening unresolved symptoms.

## 2020-11-08 ENCOUNTER — Ambulatory Visit: Payer: Medicare Other | Admitting: Family Medicine

## 2020-11-09 ENCOUNTER — Encounter: Payer: Self-pay | Admitting: Family Medicine

## 2020-11-09 ENCOUNTER — Ambulatory Visit: Payer: Medicare Other | Admitting: Nurse Practitioner

## 2020-11-09 ENCOUNTER — Ambulatory Visit (INDEPENDENT_AMBULATORY_CARE_PROVIDER_SITE_OTHER): Payer: Medicare Other | Admitting: *Deleted

## 2020-11-09 DIAGNOSIS — I482 Chronic atrial fibrillation, unspecified: Secondary | ICD-10-CM

## 2020-11-09 DIAGNOSIS — I878 Other specified disorders of veins: Secondary | ICD-10-CM

## 2020-11-09 DIAGNOSIS — I10 Essential (primary) hypertension: Secondary | ICD-10-CM

## 2020-11-09 NOTE — Patient Instructions (Signed)
Visit Information  PATIENT GOALS:  Goals Addressed             This Visit's Progress    Manage Venous Stasis Ulcers       Timeframe:  Long-Range Goal Priority:  Medium Start Date:    11/09/20                         Expected End Date:  02/04/21                     Follow-up: 11/30/20  Keep appointments with vascular surgeon and wound care Call PCP with any new or worsening symptoms [phone] Follow-up wound care instructions Call RN Care Manager as needed [phone]      RNCM: Manage My Medicine   On track    Timeframe:  Long-Range Goal Priority:  Medium Start Date: 11/01/20                            Expected End Date: 11/01/21                       Follow Up Date 11/30/20    Work with [facility] to have medications prepackaged Take medications as directed Request refills 2-3 days before running out Call [facility] as needed [phone] Notify RN Care Manager when you get a letter regarding Medicare Extra Help decision Call [facility] [phone] for Eliquis samples if needed    Why is this important?   These steps will help you keep on track with your medicines.   Notes:         Patient verbalizes understanding of instructions provided today and agrees to view in [place].    Plan:Telephone follow up appointment with care management team member scheduled for:  11/30/20 with RNCM and The patient has been provided with contact information for the care management team and has been advised to call with any health related questions or concerns.   Chong Sicilian, BSN, RN-BC Embedded Chronic Care Manager [facility] / [place] Management Direct Dial: [phone]

## 2020-11-09 NOTE — Chronic Care Management (AMB) (Signed)
Chronic Care Management   CCM RN Visit Note  11/09/2020 Name: [name] MRN: [id number] DOB: [birth year]  Subjective: [name] is a 87 y.o. year old male who is a primary care patient of Tyler Brooklyn, FNP. The care management team was consulted for assistance with disease management and care coordination needs.    Engaged with patient's daughter, [name],  for follow up visit in response to provider referral for case management and/or care coordination services.   Consent to Services:  The patient was given information about Chronic Care Management services, agreed to services, and gave verbal consent prior to initiation of services.  Please see initial visit note for detailed documentation.   Patient agreed to services and verbal consent obtained.   Assessment: Review of patient past medical history, allergies, medications, health status, including review of consultants reports, laboratory and other test data, was performed as part of comprehensive evaluation and provision of chronic care management services.   SDOH (Social Determinants of Health) assessments and interventions performed:    CCM Care Plan  No Known Allergies  Outpatient Encounter Medications as of 11/09/2020  Medication Sig   apixaban (ELIQUIS) 5 MG TABS tablet Take 1 tablet (5 mg total) by mouth 2 (two) times daily.   cephALEXin (KEFLEX) 500 MG capsule Take 1 capsule (500 mg total) by mouth 2 (two) times daily.   fish oil-omega-3 fatty acids 1000 MG capsule Take 2 g by mouth daily.   furosemide (LASIX) 20 MG tablet TAKE 1 TABLET DAILY AS NEEDED FOR FLUID   lisinopril (ZESTRIL) 40 MG tablet Take 1 tablet (40 mg total) by mouth daily.   polyethylene glycol (MIRALAX / GLYCOLAX) 17 g packet Take 17 g by mouth daily as needed for mild constipation.   rosuvastatin (CRESTOR) 5 MG tablet Take 0.5 tablets (2.5 mg total) by mouth every Monday, Wednesday, and Friday at 6 PM.   triamcinolone ointment (KENALOG) 0.1 %  Apply 1 application topically 2 (two) times daily.   No facility-administered encounter medications on file as of 11/09/2020.    Patient Active Problem List   Diagnosis Date Noted   Cellulitis of left lower extremity 10/26/2020   Cellulitis of right lower extremity 10/26/2020   OA (osteoarthritis) of hip 05/11/2020   Osteoarthritis of right hip 05/11/2020   Preoperative clearance 05/04/2020   Atrial fibrillation ([place]) 04/28/2020   Venous stasis of lower extremity 10/23/2019   Obesity (BMI 30.0-34.9) 07/18/2017   Essential hypertension 08/16/2014   Hyperlipemia 08/16/2014   Peripheral edema 06/10/2012   Vitamin D deficiency 05/10/2010   Arthritis 05/10/2010    Conditions to be addressed/monitored:Atrial Fibrillation, HTN, and venous stasis ulcer  Care Plan : [facility] Care Plan  Updates made by Ilean China, RN since 11/09/2020 12:00 AM     Problem: Chronic Disease Management Needs   Priority: Medium  Onset Date: 11/01/2020     Long-Range Goal: Work with [facility] Regarding Care Management and Care Coordination Associated with HTN, Afib, and Prescription Assistance Needs   Start Date: 11/01/2020  Expected End Date: 11/01/2021  This Visit's Progress: On track  Recent Progress: On track  Priority: Medium  Note:   Current Barriers:  Care Coordination needs related to Medication procurement  Chronic Disease Management support and education needs related to Atrial Fibrillation, HTN, and venous stasis Financial Constraints.   RNCM Clinical Goal(s):  Patient will verbalize understanding of plan for management of medication affordability  continue to work with RN Care Manager to address care management and  care coordination needs related to Atrial Fibrillation, HTN, and venous stasis  through collaboration with RN Care manager, provider, and care team.   Interventions: 1:1 collaboration with primary care provider regarding development and update of comprehensive plan of care as evidenced  by provider attestation and co-signature Inter-disciplinary care team collaboration (see longitudinal plan of care) Evaluation of current treatment plan related to  self management and patient's adherence to plan as established by provider Provided with [facility] contact information and encouraged to reach out as needed Addressed bilateral cerumen impaction questions. Advised to use OTC wax softening drops to aid in removal. Collaborated with [facility] Clinical Staff and front office staff regarding scheduling an appointment for removal in the office. Provided reasoning for why a separate appointment for removal was likely necessary (dry/hard wax can take significant amount of time to remove and can cause discomfort during the process. Process can also cause an external ear infection if water is retained behind any leftover wax.) Provided information on cerumen impaction prevention.   SDOH Barriers (Status: Goal on track: YES.)  Patient interviewed and SDOH assessment performed        SDOH Interventions    Flowsheet Row Most Recent Value  SDOH Interventions   Financial Strain Interventions Other (Comment)  [Provided with information about Medicare Extra Help and application hyperlink]     Previously discussed daughter's plan to have [place] his medications to aid in compliance Previously reviewed recent communication between PCP and PharmD regarding Afib management with Coumadin vs Eliquis due to cost of Eliquis.  Provided verbal education on Coumadin and requirements for frequent lab monitoring as well as food and drug restrictions/interactions. Daughter doesn't think Coumadin is a good option for patient and would like assistance with Eliquis. Previously provided daughter with verbal information about Eliquis prescription assistance through [employer] and qualification requirements and that the assistance is only valid through the end of the calendar year.  Discussed Medicare Extra Help  Application Daughter was able to complete application online. Waiting for response.  Discussed medication assistance through the [place]. Daughter has reached out to the [place] and is awaiting a response. She does feel that he should qualify for assistance. Consulted Lottie Dawson, PharmD about Eliquis samples. None are available at this time.  Asked daughter, [name], to give me a call at [phone] when she hears from the Extra Help application   Venous Stasis Ulcers:  (Status: New goal.) Evaluation of current treatment plan related to venous stasis ulcers and patient's adherence to plan Reviewed upcoming appointment with vascular surgeon on 11/11/20.  Discussed that wound care appointment isn't until November but vascular surgeon should be able to provide some support Assessed for transportatoin needs Son is going to take him to the appointments Reviewed office notes. Has an IT trainer dressing in place now. Wound care reviewed.  Encouraged to reach out to PCP with any new or worsening symptoms  Patient Goals/Self-Care Activities: Patient will self administer medications as prescribed as evidenced by self report/primary caregiver report  Patient will call pharmacy for medication refills as evidenced by patient report and review of pharmacy fill history as appropriate Patient will continue to perform ADL's independently as evidenced by patient/caregiver report Patient will call provider office for new concerns or questions as evidenced by review of documented incoming telephone call notes and patient report       Plan:Telephone follow up appointment with care management team member scheduled for:  11/30/20 with RNCM and The patient has been provided with  contact information for the care management team and has been advised to call with any health related questions or concerns.   Chong Sicilian, BSN, RN-BC Embedded Chronic Care Manager [facility] / [place]  Management Direct Dial: [phone]

## 2020-11-11 ENCOUNTER — Other Ambulatory Visit: Payer: Self-pay

## 2020-11-11 ENCOUNTER — Ambulatory Visit (INDEPENDENT_AMBULATORY_CARE_PROVIDER_SITE_OTHER): Payer: Medicare Other | Admitting: Vascular Surgery

## 2020-11-11 ENCOUNTER — Encounter: Payer: Self-pay | Admitting: Vascular Surgery

## 2020-11-11 VITALS — BP 186/80 | HR 49 | Temp 98.2°F | Resp 20 | Ht 73.0 in | Wt 238.0 lb

## 2020-11-11 DIAGNOSIS — I872 Venous insufficiency (chronic) (peripheral): Secondary | ICD-10-CM

## 2020-11-11 DIAGNOSIS — M7989 Other specified soft tissue disorders: Secondary | ICD-10-CM

## 2020-11-11 NOTE — Progress Notes (Signed)
Office Note     CC: Bilateral lower extremity edema, with recent calf wounds Requesting Provider:  [name], FNP  HPI: [name] is a 87 y.o. ([birth year]) male who presents at the request of [name], FNP for evaluation of bilateral lower extremity edema, recent calf wounds.  He was seen in our office several years ago for bilateral lower extremity chronic venous stasis.  At that time, he had cellulitis to both calves and severe swelling.  He was placed in compression stockings and asked to follow-up that time, [name] has been doing well, living out in [place].  He recently stopped wearing his compressions and appreciated new onset wounds to bilateral lower extremities.  These were treated with Unna boots, and since, the wounds have healed.  He denies fevers, chills, new wounds.  [name] continues to be independent, ambulating with use of a cane.  His legs are tired and heavy, but he no longer has ulcerations, bleeding.  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  He has no history of previous vein procedures, no history of DVT.  Compression stockings have worked well in the past to prevent ulcerations.  The pt is on a statin for cholesterol management.  The pt is not on a daily aspirin.   Other AC:  [name] The pt is on medications for hypertension.   The pt is not diabetic.   Tobacco hx:  never smoker  Past Medical History:  Diagnosis Date   Arthritis    Atrial fibrillation ([facility]) 04/28/2020   Hyperlipidemia    Hypertension    Skin cancer    Face    Past Surgical History:  Procedure Laterality Date   APPENDECTOMY     JOINT REPLACEMENT Left    hip    SKIN LESION EXCISION     Face   TOTAL HIP ARTHROPLASTY Right 05/11/2020   Procedure: TOTAL HIP ARTHROPLASTY ANTERIOR APPROACH;  Surgeon: [name], MD;  Location: [facility];  Service: Orthopedics;  Laterality: Right;  140min    Social History   Socioeconomic History    Marital status: Divorced    Spouse name: Not on file   Number of children: 3   Years of education: 11   Highest education level: 11th grade  Occupational History   Not on file  Tobacco Use   Smoking status: Never   Smokeless tobacco: Never  Vaping Use   Vaping Use: Never used  Substance and Sexual Activity   Alcohol use: No   Drug use: No   Sexual activity: Not on file  Other Topics Concern   Not on file  Social History Narrative   Not on file   Social Determinants of Health   Financial Resource Strain: Medium Risk   Difficulty of Paying Living Expenses: Somewhat hard  Food Insecurity: No Food Insecurity   Worried About Running Out of Food in the Last Year: Never true   Ran Out of Food in the Last Year: Never true  Transportation Needs: No Transportation Needs   Lack of Transportation (Medical): No   Lack of Transportation (Non-Medical): No  Physical Activity: Not on file  Stress: Not on file  Social Connections: Not on file  Intimate Partner Violence: Not on file    Family History  Problem Relation Age of Onset   Cancer Mother    Stroke Father    Dementia Brother    Healthy Daughter    Healthy  Son    Healthy Son     Current Outpatient Medications  Medication Sig Dispense Refill   apixaban ([name]) 5 MG TABS tablet Take 1 tablet (5 mg total) by mouth 2 (two) times daily. 180 tablet 1   cephALEXin (KEFLEX) 500 MG capsule Take 1 capsule (500 mg total) by mouth 2 (two) times daily. 14 capsule 0   fish oil-omega-3 fatty acids 1000 MG capsule Take 2 g by mouth daily.     furosemide (LASIX) 20 MG tablet TAKE 1 TABLET DAILY AS NEEDED FOR FLUID 90 tablet 1   lisinopril (ZESTRIL) 40 MG tablet Take 1 tablet (40 mg total) by mouth daily. 90 tablet 1   polyethylene glycol (MIRALAX / GLYCOLAX) 17 g packet Take 17 g by mouth daily as needed for mild constipation. 14 each 0   rosuvastatin (CRESTOR) 5 MG tablet Take 0.5 tablets (2.5 mg total) by mouth every Monday, Wednesday,  and Friday at 6 PM. 18 tablet 1   triamcinolone ointment (KENALOG) 0.1 % Apply 1 application topically 2 (two) times daily. 454 g 0   No current facility-administered medications for this visit.    No Known Allergies   REVIEW OF SYSTEMS:   [X]  denotes positive finding, [ ]  denotes negative finding Cardiac  Comments:  Chest pain or chest pressure:    Shortness of breath upon exertion:    Short of breath when lying flat:    Irregular heart rhythm:        Vascular    Pain in calf, thigh, or hip brought on by ambulation:    Pain in feet at night that wakes you up from your sleep:     Blood clot in your veins:    Leg swelling:         Pulmonary    Oxygen at home:    Productive cough:     Wheezing:         Neurologic    Sudden weakness in arms or legs:     Sudden numbness in arms or legs:     Sudden onset of difficulty speaking or slurred speech:    Temporary loss of vision in one eye:     Problems with dizziness:         Gastrointestinal    Blood in stool:     Vomited blood:         Genitourinary    Burning when urinating:     Blood in urine:        Psychiatric    Major depression:         Hematologic    Bleeding problems:    Problems with blood clotting too easily:        Skin    Rashes or ulcers:        Constitutional    Fever or chills:      PHYSICAL EXAMINATION:  Vitals:   11/11/20 1006  BP: (!) 186/80  Pulse: (!) 49  Resp: 20  Temp: 98.2 F (36.8 C)  SpO2: 95%  Weight: 238 lb (108 kg)  Height: 6\' 1"  (1.854 m)    General:  WDWN in NAD; vital signs documented above Gait: Not observed HENT: WNL, normocephalic Pulmonary: normal non-labored breathing , without Rales, rhonchi,  wheezing Cardiac: regular HR, Abdomen: soft, NT, no masses Skin: without rashes Vascular Exam/Pulses:  Right Left  Radial 2+ (normal) 2+ (normal)  Ulnar 2+ (normal) 2+ (normal)  Femoral    Popliteal    DP  PT 2+ (normal) 2+ (normal)   Extremities: without  ischemic changes, without Gangrene , without cellulitis; without open wounds;  Musculoskeletal: no muscle wasting or atrophy  Neurologic: A&O X 3;  No focal weakness or paresthesias are detected Psychiatric:  The pt has Normal affect.   Non-Invasive Vascular Imaging:   No imaging    ASSESSMENT/PLAN:: 87 y.o. male presenting with bilateral lower extremity chronic venous stasis.  The wounds have recently healed with the use of Unna boots bilaterally.  [name] does not have any imaging but physical exam demonstrates lipodermatosclerosis which occurs from hemosiderin deposits in the lower extremity from chronic venous stasis. CEAP score 5. In the past, compression stockings have helped [name] tremendously.  He has been ulcer free prior to discontinuing them.  At 87 years old, [name] would benefit most from medical management of his venous disease.  He was fitted in our office for 20 to 52mmHg knee-high compression stockings. [name] asked to follow-up as needed, which is acceptable as his wounds are healed.   Tyler John, MD [facility] [phone]

## 2020-11-15 ENCOUNTER — Telehealth: Payer: Self-pay | Admitting: Family Medicine

## 2020-11-15 NOTE — Telephone Encounter (Signed)
Daughter calling--please fax rx for apixaban (ELIQUIS) 5 MG TABS tablet and office notes with explanation as to why he is taking this rx. If this rx is transferred to the [place], he can get it free.  FAX TO Dr Leonor Liv at [facility] at [phone].

## 2020-11-22 ENCOUNTER — Telehealth: Payer: Self-pay | Admitting: Family Medicine

## 2020-11-22 NOTE — Telephone Encounter (Signed)
Daughter has bought Debrox kit and will begin using.  Would like to schedule appointment to have ears cleaned out the end of next week.  Appointment scheduled with Hendricks Limes on 12/01/20 at 8:50 am.

## 2020-11-30 ENCOUNTER — Ambulatory Visit: Payer: Medicare Other | Admitting: *Deleted

## 2020-11-30 ENCOUNTER — Encounter: Payer: Self-pay | Admitting: *Deleted

## 2020-11-30 DIAGNOSIS — I482 Chronic atrial fibrillation, unspecified: Secondary | ICD-10-CM

## 2020-11-30 DIAGNOSIS — I10 Essential (primary) hypertension: Secondary | ICD-10-CM

## 2020-11-30 NOTE — Chronic Care Management (AMB) (Signed)
Chronic Care Management   CCM RN Visit Note  11/30/2020 Name: [name] MRN: [id number] DOB: [birth year]  Subjective: [name] is a 87 y.o. year old male who is a primary care patient of [name], FNP. The care management team was consulted for assistance with disease management and care coordination needs.    Engaged with patient by telephone for follow up visit in response to provider referral for case management and/or care coordination services.   Consent to Services:  The patient was given information about Chronic Care Management services, agreed to services, and gave verbal consent prior to initiation of services.  Please see initial visit note for detailed documentation.   Patient agreed to services and verbal consent obtained.   Assessment: Review of patient past medical history, allergies, medications, health status, including review of consultants reports, laboratory and other test data, was performed as part of comprehensive evaluation and provision of chronic care management services.   SDOH (Social Determinants of Health) assessments and interventions performed:    CCM Care Plan  No Known Allergies  Outpatient Encounter Medications as of 11/30/2020  Medication Sig   apixaban (ELIQUIS) 5 MG TABS tablet Take 1 tablet (5 mg total) by mouth 2 (two) times daily.   cephALEXin (KEFLEX) 500 MG capsule Take 1 capsule (500 mg total) by mouth 2 (two) times daily.   fish oil-omega-3 fatty acids 1000 MG capsule Take 2 g by mouth daily.   furosemide (LASIX) 20 MG tablet TAKE 1 TABLET DAILY AS NEEDED FOR FLUID   lisinopril (ZESTRIL) 40 MG tablet Take 1 tablet (40 mg total) by mouth daily.   polyethylene glycol (MIRALAX / GLYCOLAX) 17 g packet Take 17 g by mouth daily as needed for mild constipation.   rosuvastatin (CRESTOR) 5 MG tablet Take 0.5 tablets (2.5 mg total) by mouth every Monday, Wednesday, and Friday at 6 PM.   triamcinolone ointment (KENALOG) 0.1 % Apply  1 application topically 2 (two) times daily.   No facility-administered encounter medications on file as of 11/30/2020.    Patient Active Problem List   Diagnosis Date Noted   Cellulitis of left lower extremity 10/26/2020   Cellulitis of right lower extremity 10/26/2020   OA (osteoarthritis) of hip 05/11/2020   Osteoarthritis of right hip 05/11/2020   Preoperative clearance 05/04/2020   Atrial fibrillation ([place]) 04/28/2020   Venous stasis of lower extremity 10/23/2019   Obesity (BMI 30.0-34.9) 07/18/2017   Essential hypertension 08/16/2014   Hyperlipemia 08/16/2014   Peripheral edema 06/10/2012   Vitamin D deficiency 05/10/2010   Arthritis 05/10/2010    Conditions to be addressed/monitored:Atrial Fibrillation and HTN  Care Plan : [facility] Care Plan  Updates made by Ilean China, RN since 11/30/2020 12:00 AM     Problem: Chronic Disease Management Needs   Priority: Medium  Onset Date: 11/01/2020     Long-Range Goal: Work with [facility] Regarding Care Management and Care Coordination Associated with HTN, Afib, and Prescription Assistance Needs   Start Date: 11/01/2020  Expected End Date: 11/01/2021  This Visit's Progress: On track  Recent Progress: On track  Priority: Medium  Note:   Current Barriers:  Care Coordination needs related to Medication procurement  Chronic Disease Management support and education needs related to Atrial Fibrillation, HTN, and venous stasis Financial Constraints.   RNCM Clinical Goal(s):  Patient will verbalize understanding of plan for management of medication affordability  continue to work with RN Care Manager to address care management and care coordination needs related  to Atrial Fibrillation, HTN, and venous stasis  through collaboration with RN Care manager, provider, and care team.   Interventions: 1:1 collaboration with primary care provider regarding development and update of comprehensive plan of care as evidenced by provider attestation  and co-signature Inter-disciplinary care team collaboration (see longitudinal plan of care) Evaluation of current treatment plan related to  self management and patient's adherence to plan as established by provider Provided with [facility] contact information and encouraged to reach out as needed Reviewed upcoming appointment for 12/01/20 for ear irrigation. Patient has been using Debrox wax softening drops. Previously collaborated with [facility] clinical a staff regarding need for appointment.   SDOH Barriers (Status: Goal on track: YES.)  Patient interviewed and SDOH assessment performed Previously discussed Medicare Extra Help (LIS) and prescription assistance Received confirmation from patient's daughter that he is able to receive medication through the [place]   Venous Stasis Ulcers:  (Status: Condition stable. Not addressed this visit.) Evaluation of current treatment plan related to venous stasis ulcers and patient's adherence to plan Reviewed upcoming appointment with vascular surgeon on 11/11/20.  Discussed that wound care appointment isn't until November but vascular surgeon should be able to provide some support Assessed for transportatoin needs Son is going to take him to the appointments Reviewed office notes. Has an IT trainer dressing in place now. Wound care reviewed.  Encouraged to reach out to PCP with any new or worsening symptoms  Patient Goals/Self-Care Activities: Patient will self administer medications as prescribed as evidenced by self report/primary caregiver report  Patient will call pharmacy for medication refills as evidenced by patient report and review of pharmacy fill history as appropriate Patient will continue to perform ADL's independently as evidenced by patient/caregiver report Patient will call provider office for new concerns or questions as evidenced by review of documented incoming telephone call notes and patient report       Plan:Telephone follow up  appointment with care management team member scheduled for:  01/02/21 with RNCM The patient has been provided with contact information for the care management team and has been advised to call with any health related questions or concerns.   Chong Sicilian, BSN, RN-BC Embedded Chronic Care Manager [facility] / [place] Management Direct Dial: [phone]

## 2020-11-30 NOTE — Patient Instructions (Signed)
Visit Information  Patient Care Plan: [facility] Care Plan     Problem Identified: Chronic Disease Management Needs   Priority: Medium  Onset Date: 11/01/2020     Long-Range Goal: Work with [facility] Regarding Care Management and Care Coordination Associated with HTN, Afib, and Prescription Assistance Needs   Start Date: 11/01/2020  Expected End Date: 11/01/2021  This Visit's Progress: On track  Recent Progress: On track  Priority: Medium  Note:   Current Barriers:  Care Coordination needs related to Medication procurement  Chronic Disease Management support and education needs related to Atrial Fibrillation, HTN, and venous stasis Financial Constraints.   RNCM Clinical Goal(s):  Patient will verbalize understanding of plan for management of medication affordability  continue to work with RN Care Manager to address care management and care coordination needs related to Atrial Fibrillation, HTN, and venous stasis  through collaboration with RN Care manager, provider, and care team.   Interventions: 1:1 collaboration with primary care provider regarding development and update of comprehensive plan of care as evidenced by provider attestation and co-signature Inter-disciplinary care team collaboration (see longitudinal plan of care) Evaluation of current treatment plan related to  self management and patient's adherence to plan as established by provider Provided with [facility] contact information and encouraged to reach out as needed Reviewed upcoming appointment for 12/01/20 for ear irrigation. Patient has been using Debrox wax softening drops. Previously collaborated with [facility] clinical a staff regarding need for appointment.   SDOH Barriers (Status: Goal on track: YES.)  Patient interviewed and SDOH assessment performed Previously discussed Medicare Extra Help (LIS) and prescription assistance Received confirmation from patient's daughter that he is able to receive medication through the  [place]   Venous Stasis Ulcers:  (Status: Condition stable. Not addressed this visit.) Evaluation of current treatment plan related to venous stasis ulcers and patient's adherence to plan Reviewed upcoming appointment with vascular surgeon on 11/11/20.  Discussed that wound care appointment isn't until November but vascular surgeon should be able to provide some support Assessed for transportatoin needs Son is going to take him to the appointments Reviewed office notes. Has an IT trainer dressing in place now. Wound care reviewed.  Encouraged to reach out to PCP with any new or worsening symptoms  Patient Goals/Self-Care Activities: Patient will self administer medications as prescribed as evidenced by self report/primary caregiver report  Patient will call pharmacy for medication refills as evidenced by patient report and review of pharmacy fill history as appropriate Patient will continue to perform ADL's independently as evidenced by patient/caregiver report Patient will call provider office for new concerns or questions as evidenced by review of documented incoming telephone call notes and patient report        Patient verbalizes understanding of instructions provided today and agrees to view in [place].    Plan:Telephone follow up appointment with care management team member scheduled for:  01/02/21 with RNCM The patient has been provided with contact information for the care management team and has been advised to call with any health related questions or concerns.   Chong Sicilian, BSN, RN-BC Embedded Chronic Care Manager [facility] / [place] Management Direct Dial: [phone]

## 2020-12-01 ENCOUNTER — Encounter: Payer: Self-pay | Admitting: Family Medicine

## 2020-12-01 ENCOUNTER — Ambulatory Visit (INDEPENDENT_AMBULATORY_CARE_PROVIDER_SITE_OTHER): Payer: Medicare Other | Admitting: Family Medicine

## 2020-12-01 ENCOUNTER — Other Ambulatory Visit: Payer: Self-pay

## 2020-12-01 VITALS — BP 171/79 | HR 66 | Temp 97.7°F | Ht 73.0 in | Wt 243.6 lb

## 2020-12-01 DIAGNOSIS — H6123 Impacted cerumen, bilateral: Secondary | ICD-10-CM | POA: Diagnosis not present

## 2020-12-01 DIAGNOSIS — I10 Essential (primary) hypertension: Secondary | ICD-10-CM

## 2020-12-01 NOTE — Progress Notes (Signed)
Assessment & Plan:  1. Bilateral impacted cerumen - encouraged to use debrox drops  2. Essential hypertension - encouraged to take medications as prescribed - encouraged to take blood pressure at home and keep a log - education provided on hypertension  Patient's son, [name], updated on today's visit.   Follow up plan: Return in about 6 weeks (around 01/12/2021) for HTN.  Lucile Crater, NP Student  I personally was present during the history, physical exam, and medical decision-making activities of this service and have verified that the service and findings are accurately documented in the nurse practitioner student's note.  Hendricks Limes, MSN, APRN, FNP-C [facility]   Subjective:   Patient ID: [name], male    DOB: 12-Jul-1929, 87 y.o.   MRN: [id number]  HPI: [name] is a 87 y.o. male presenting on 12/01/2020 for tightness on right ear (Few days)   He states his right ear is tight and he is having difficulty hearing. He states his son helped put saltwater in his ear the other night to flush it out but it didn't help.   His blood pressure was previously well controlled at his last visit, but it is high today. He states he did not take his medication today. He does not take his blood pressure at home.    ROS: Negative unless specifically indicated above in HPI.   Relevant past medical history reviewed and updated as indicated.   Allergies and medications reviewed and updated.   Current Outpatient Medications:    apixaban (ELIQUIS) 5 MG TABS tablet, Take 1 tablet (5 mg total) by mouth 2 (two) times daily., Disp: 180 tablet, Rfl: 1   fish oil-omega-3 fatty acids 1000 MG capsule, Take 2 g by mouth daily., Disp: , Rfl:    furosemide (LASIX) 20 MG tablet, TAKE 1 TABLET DAILY AS NEEDED FOR FLUID, Disp: 90 tablet, Rfl: 1   lisinopril (ZESTRIL) 40 MG tablet, Take 1 tablet (40 mg total) by mouth daily., Disp: 90 tablet, Rfl: 1   polyethylene  glycol (MIRALAX / GLYCOLAX) 17 g packet, Take 17 g by mouth daily as needed for mild constipation., Disp: 14 each, Rfl: 0   rosuvastatin (CRESTOR) 5 MG tablet, Take 0.5 tablets (2.5 mg total) by mouth every Monday, Wednesday, and Friday at 6 PM., Disp: 18 tablet, Rfl: 1   triamcinolone ointment (KENALOG) 0.1 %, Apply 1 application topically 2 (two) times daily., Disp: 454 g, Rfl: 0  No Known Allergies  Objective:   BP (!) 171/79   Pulse 66   Temp 97.7 F (36.5 C) (Temporal)   Ht 6\' 1"  (1.854 m)   Wt 110.5 kg   SpO2 98%   BMI 32.14 kg/m    Physical Exam Vitals reviewed.  Constitutional:      General: He is not in acute distress.    Appearance: Normal appearance. He is obese. He is not ill-appearing, toxic-appearing or diaphoretic.  HENT:     Head: Normocephalic and atraumatic.     Right Ear: Ear canal and external ear normal. There is impacted cerumen.     Left Ear: Ear canal and external ear normal. There is impacted cerumen.  Eyes:     General: No scleral icterus.       Right eye: No discharge.        Left eye: No discharge.     Conjunctiva/sclera: Conjunctivae normal.  Cardiovascular:     Rate and Rhythm: Normal rate and regular rhythm.  Heart sounds: Normal heart sounds. No murmur heard.   No friction rub. No gallop.  Pulmonary:     Effort: Pulmonary effort is normal. No respiratory distress.     Breath sounds: Normal breath sounds. No stridor. No wheezing, rhonchi or rales.  Musculoskeletal:        General: Normal range of motion.     Cervical back: Normal range of motion.     Right lower leg: No edema.     Left lower leg: No edema.  Skin:    General: Skin is warm and dry.  Neurological:     Mental Status: He is alert and oriented to person, place, and time. Mental status is at baseline.  Psychiatric:        Mood and Affect: Mood normal.        Behavior: Behavior normal.        Thought Content: Thought content normal.        Judgment: Judgment normal.

## 2020-12-01 NOTE — Patient Instructions (Addendum)
Purchase Debrox wax removal kit over the counter. Drops (3-4) should be instilled twice daily x3 days, then the ear flushed with warm water on the 4th day.   You need to be checking your blood pressure at least every other day and keeping a log to bring back with you to your next appointment. Your last several blood pressure readings have been too high. Your goal is <150/90.

## 2020-12-05 DIAGNOSIS — I482 Chronic atrial fibrillation, unspecified: Secondary | ICD-10-CM | POA: Diagnosis not present

## 2020-12-05 DIAGNOSIS — I10 Essential (primary) hypertension: Secondary | ICD-10-CM | POA: Diagnosis not present

## 2020-12-21 ENCOUNTER — Encounter (HOSPITAL_BASED_OUTPATIENT_CLINIC_OR_DEPARTMENT_OTHER): Payer: Medicare Other | Admitting: Physician Assistant

## 2021-01-02 ENCOUNTER — Telehealth: Payer: Medicare Other

## 2021-01-16 ENCOUNTER — Telehealth: Payer: Self-pay | Admitting: Family Medicine

## 2021-01-17 DIAGNOSIS — M7989 Other specified soft tissue disorders: Secondary | ICD-10-CM

## 2021-01-17 NOTE — Telephone Encounter (Signed)
That is fine. I am glad he is finally taking Eliquis as prescribed.

## 2021-01-17 NOTE — Telephone Encounter (Signed)
Daughter aware and verbalizes understanding. 

## 2021-01-20 ENCOUNTER — Ambulatory Visit: Payer: Medicare Other | Admitting: Cardiovascular Disease

## 2021-03-24 ENCOUNTER — Encounter: Payer: Self-pay | Admitting: Family

## 2021-03-24 ENCOUNTER — Ambulatory Visit (INDEPENDENT_AMBULATORY_CARE_PROVIDER_SITE_OTHER): Payer: Medicare Other | Admitting: Family

## 2021-03-24 VITALS — BP 138/88 | HR 78 | Temp 97.4°F | Ht 73.0 in | Wt 241.0 lb

## 2021-03-24 DIAGNOSIS — R059 Cough, unspecified: Secondary | ICD-10-CM

## 2021-03-24 DIAGNOSIS — K219 Gastro-esophageal reflux disease without esophagitis: Secondary | ICD-10-CM | POA: Diagnosis not present

## 2021-03-24 MED ORDER — OMEPRAZOLE 20 MG PO CPDR: 20.0000 mg | DELAYED_RELEASE_CAPSULE | Freq: Every day | ORAL | 1 refills | Status: AC

## 2021-03-24 NOTE — Patient Instructions (Signed)

## 2021-03-24 NOTE — Progress Notes (Signed)
Subjective:    Patient ID: [name], male    DOB: 1929-10-06, 88 y.o.   MRN: [id number]  Chief Complaint  Patient presents with   GI Problem    After he eats he coughs up stuff x 1 mth. Denies any pain, vomiting, diarrhea     GI Problem Primary symptoms do not include fever, fatigue, abdominal pain, nausea, vomiting, diarrhea or dysuria.  The illness is also significant for dysphagia. Significant associated medical issues include GERD.  Gastroesophageal Reflux He complains of coughing and dysphagia. He reports no abdominal pain, no belching, no choking, no heartburn, no nausea or no sore throat. The current episode started more than 1 month ago. The problem occurs occasionally. Pertinent negatives include no fatigue. Risk factors include obesity. He has tried nothing for the symptoms. The treatment provided no relief.     Review of Systems  Constitutional:  Negative for fatigue and fever.  HENT:  Negative for sore throat.   Respiratory:  Positive for cough. Negative for choking.   Gastrointestinal:  Positive for dysphagia. Negative for abdominal pain, diarrhea, heartburn, nausea and vomiting.  Genitourinary:  Negative for dysuria.  All other systems reviewed and are negative.     Objective:   Physical Exam Vitals reviewed.  Constitutional:      General: He is not in acute distress.    Appearance: He is well-developed.  HENT:     Head: Normocephalic.     Right Ear: Tympanic membrane normal.     Left Ear: Tympanic membrane normal.  Eyes:     General:        Right eye: No discharge.        Left eye: No discharge.     Pupils: Pupils are equal, round, and reactive to light.  Neck:     Thyroid: No thyromegaly.  Cardiovascular:     Rate and Rhythm: Normal rate and regular rhythm.     Heart sounds: Normal heart sounds. No murmur heard. Pulmonary:     Effort: Pulmonary effort is normal. No respiratory distress.     Breath sounds: Normal breath sounds. No wheezing.   Abdominal:     General: Bowel sounds are normal. There is no distension.     Palpations: Abdomen is soft.     Tenderness: There is no abdominal tenderness.  Musculoskeletal:        General: No tenderness. Normal range of motion.     Cervical back: Normal range of motion and neck supple.  Skin:    General: Skin is warm and dry.     Findings: No erythema or rash.  Neurological:     Mental Status: He is alert and oriented to person, place, and time.     Cranial Nerves: No cranial nerve deficit.     Deep Tendon Reflexes: Reflexes are normal and symmetric.  Psychiatric:        Behavior: Behavior normal.        Thought Content: Thought content normal.        Judgment: Judgment normal.      Blood pressure 138/88, pulse 78, temperature (!) 97.4 F (36.3 C), temperature source Temporal, height 6\' 1"  (1.854 m), weight 241 lb (109.3 kg).     Assessment & Plan:  [name] comes in today with chief complaint of GI Problem (After he eats he coughs up stuff x 1 mth. Denies any pain, vomiting, diarrhea )   Diagnosis and orders addressed:  1. Cough, unspecified type - omeprazole (PRILOSEC)  20 MG capsule; Take 1 capsule (20 mg total) by mouth daily.  Dispense: 90 capsule; Refill: 1  2. Gastroesophageal reflux disease, unspecified whether esophagitis present -Start Omeprazole 20 mg  -Diet discussed- Avoid fried, spicy, citrus foods, caffeine and alcohol -Do not eat 2-3 hours before bedtime -Encouraged small frequent meals -Avoid NSAID's -RTO  - omeprazole (PRILOSEC) 20 MG capsule; Take 1 capsule (20 mg total) by mouth daily.  Dispense: 90 capsule; Refill: [place], FNP

## 2021-03-29 ENCOUNTER — Ambulatory Visit: Payer: Medicare Other | Admitting: Family Medicine

## 2021-04-26 ENCOUNTER — Other Ambulatory Visit: Payer: Self-pay | Admitting: Family Medicine

## 2021-04-26 DIAGNOSIS — I878 Other specified disorders of veins: Secondary | ICD-10-CM

## 2021-04-26 DIAGNOSIS — I10 Essential (primary) hypertension: Secondary | ICD-10-CM

## 2021-04-26 DIAGNOSIS — E782 Mixed hyperlipidemia: Secondary | ICD-10-CM

## 2021-05-30 ENCOUNTER — Encounter: Payer: Self-pay | Admitting: Family Medicine

## 2021-05-30 ENCOUNTER — Ambulatory Visit (INDEPENDENT_AMBULATORY_CARE_PROVIDER_SITE_OTHER): Payer: Medicare Other | Admitting: Family Medicine

## 2021-05-30 VITALS — BP 170/93 | HR 49 | Temp 97.1°F | Ht 73.0 in | Wt 248.4 lb

## 2021-05-30 DIAGNOSIS — I872 Venous insufficiency (chronic) (peripheral): Secondary | ICD-10-CM | POA: Diagnosis not present

## 2021-05-30 DIAGNOSIS — L97929 Non-pressure chronic ulcer of unspecified part of left lower leg with unspecified severity: Secondary | ICD-10-CM

## 2021-05-30 DIAGNOSIS — L97829 Non-pressure chronic ulcer of other part of left lower leg with unspecified severity: Secondary | ICD-10-CM

## 2021-05-30 DIAGNOSIS — I10 Essential (primary) hypertension: Secondary | ICD-10-CM | POA: Diagnosis not present

## 2021-05-30 DIAGNOSIS — L03115 Cellulitis of right lower limb: Secondary | ICD-10-CM | POA: Diagnosis not present

## 2021-05-30 MED ORDER — CEPHALEXIN 500 MG PO CAPS: 500.0000 mg | ORAL_CAPSULE | Freq: Two times a day (BID) | ORAL | 0 refills | Status: AC

## 2021-05-30 NOTE — Progress Notes (Signed)
? ?Assessment & Plan:  ?1. Cellulitis of right lower extremity ?- cephALEXin (KEFLEX) 500 MG capsule; Take 1 capsule (500 mg total) by mouth 2 (two) times daily for 7 days.  Dispense: 14 capsule; Refill: 0 ? ?2-3. Chronic venous stasis dermatitis of both lower extremities/Venous stasis ulcer of leg without varicose veins (HCC) ?Uncontrolled. Unna boots applied bilaterally in the office. Education provided on chronic venous insufficiency. ? ?4. Essential hypertension ?Uncontrolled. Patient to monitor his blood pressure at home, keep a log, and bring it back with him to his next appointment.  ? ? ?Follow up plan: Return in about 3 days (around 06/02/2021) for Unna boots & BP f/u. ? ?Hendricks Limes, MSN, APRN, FNP-C ?[place] ? ?Subjective:  ? ?Patient ID: [name], male    DOB: 10-14-1929, 88 y.o.   MRN: [id number] ? ?HPI: ?[name] is a 88 y.o. male presenting on 05/30/2021 for Leg Pain (Bilateral lower leg pain and sores.  Patient states it has been on and off for a few years. ) ? ?Patient reports bilateral leg pain and sores. The right leg hurts worse than the left. He has a history of chronic venous stasis and does wear compression hose daily. He last saw his vascular specialist on 11/11/2020 at which time he was advised to continue wearing compression hose and follow-up as needed.  ? ? ?ROS: Negative unless specifically indicated above in HPI.  ? ?Relevant past medical history reviewed and updated as indicated.  ? ?Allergies and medications reviewed and updated. ? ? ?Current Outpatient Medications:  ?  apixaban (ELIQUIS) 5 MG TABS tablet, Take 1 tablet (5 mg total) by mouth 2 (two) times daily., Disp: 180 tablet, Rfl: 1 ?  fish oil-omega-3 fatty acids 1000 MG capsule, Take 2 g by mouth daily., Disp: , Rfl:  ?  furosemide (LASIX) 20 MG tablet, Take 1 tablet (20 mg total) by mouth daily. FOR FLUID (NEEDS TO BE SEEN BEFORE NEXT REFILL), Disp: 30 tablet, Rfl: 0 ?  lisinopril (ZESTRIL)  40 MG tablet, Take 1 tablet (40 mg total) by mouth daily. (NEEDS TO BE SEEN BEFORE NEXT REFILL), Disp: 30 tablet, Rfl: 0 ?  omeprazole (PRILOSEC) 20 MG capsule, Take 1 capsule (20 mg total) by mouth daily., Disp: 90 capsule, Rfl: 1 ?  polyethylene glycol (MIRALAX / GLYCOLAX) 17 g packet, Take 17 g by mouth daily as needed for mild constipation., Disp: 14 each, Rfl: 0 ?  rosuvastatin (CRESTOR) 5 MG tablet, TAKE 1/2 TABLET ON MONDAY, WEDNESDAY AND FRIDAY AT 6PM (NEEDS TO BE SEEN BEFORE NEXT REFILL), Disp: 6 tablet, Rfl: 0 ?  triamcinolone ointment (KENALOG) 0.1 %, Apply 1 application topically 2 (two) times daily., Disp: 454 g, Rfl: 0 ? ?No Known Allergies ? ?Objective:  ? ?BP (!) 170/93   Pulse (!) 49   Temp (!) 97.1 ?F (36.2 ?C) (Temporal)   Ht '6\' 1"'$  (1.854 m)   Wt 248 lb 6.4 oz (112.7 kg)   SpO2 97%   BMI 32.77 kg/m?   ? ?Physical Exam ?Vitals reviewed.  ?Constitutional:   ?   General: He is not in acute distress. ?   Appearance: Normal appearance. He is not ill-appearing, toxic-appearing or diaphoretic.  ?HENT:  ?   Head: Normocephalic and atraumatic.  ?Eyes:  ?   General: No scleral icterus.    ?   Right eye: No discharge.     ?   Left eye: No discharge.  ?   Conjunctiva/sclera: Conjunctivae normal.  ?  Cardiovascular:  ?   Rate and Rhythm: Normal rate.  ?Pulmonary:  ?   Effort: Pulmonary effort is normal. No respiratory distress.  ?Musculoskeletal:     ?   General: Normal range of motion.  ?   Cervical back: Normal range of motion.  ?   Right lower leg: Swelling and tenderness present.  ?   Left lower leg: Swelling present.  ?Skin: ?   General: Skin is warm and dry.  ?   Findings: Erythema (BLE (R>L)) and wound (multiple on BLE; warmth on the right leg) present.  ?Neurological:  ?   Mental Status: He is alert and oriented to person, place, and time. Mental status is at baseline.  ?Psychiatric:     ?   Mood and Affect: Mood normal.     ?   Behavior: Behavior normal.     ?   Thought Content: Thought content  normal.     ?   Judgment: Judgment normal.  ? ? ? ? ? ? ?

## 2021-05-30 NOTE — Patient Instructions (Signed)
Please check blood pressure a few times per week and keep a log to bring back to the next appointment.  ?

## 2021-06-01 ENCOUNTER — Telehealth: Payer: Self-pay | Admitting: Family Medicine

## 2021-06-01 NOTE — Telephone Encounter (Signed)
I spoke with the pt's daughter and she would like a place on her dads face looked at tomorrow during the visit and then once his OV notes from 4/25 and tomorrow are complete they need to be faxed to the [place]. She will send a MyChart message with the fax number for the [place]. ?

## 2021-06-01 NOTE — Telephone Encounter (Signed)
Pts daughter called requesting to speak with nurse regarding his appt for tomorrow since she cant be with pt when he comes in. ? ?Please call daughter at [phone] ?

## 2021-06-02 ENCOUNTER — Encounter: Payer: Self-pay | Admitting: Family Medicine

## 2021-06-02 ENCOUNTER — Ambulatory Visit (INDEPENDENT_AMBULATORY_CARE_PROVIDER_SITE_OTHER): Payer: Medicare Other | Admitting: Family Medicine

## 2021-06-02 VITALS — BP 161/82 | HR 54 | Temp 97.9°F | Ht 73.0 in | Wt 250.4 lb

## 2021-06-02 DIAGNOSIS — I872 Venous insufficiency (chronic) (peripheral): Secondary | ICD-10-CM

## 2021-06-02 DIAGNOSIS — I10 Essential (primary) hypertension: Secondary | ICD-10-CM | POA: Diagnosis not present

## 2021-06-02 DIAGNOSIS — L03115 Cellulitis of right lower limb: Secondary | ICD-10-CM | POA: Diagnosis not present

## 2021-06-02 DIAGNOSIS — L03116 Cellulitis of left lower limb: Secondary | ICD-10-CM | POA: Diagnosis not present

## 2021-06-02 DIAGNOSIS — R609 Edema, unspecified: Secondary | ICD-10-CM | POA: Diagnosis not present

## 2021-06-02 DIAGNOSIS — L97909 Non-pressure chronic ulcer of unspecified part of unspecified lower leg with unspecified severity: Secondary | ICD-10-CM

## 2021-06-02 NOTE — Progress Notes (Signed)
? ?  Acute Office Visit ? ?Subjective:  ? ?  ?Patient ID: [name], male    DOB: March 01, 1929, 88 y.o.   MRN: [id number] ? ?Chief Complaint  ?Patient presents with  ? Cellulitis  ? ? ?HPI ?Patient is in today for follow up of cellulitis and venous stasis ulcers. He was seen in PCP 3 days ago. He was started on Keflex and unna boots were applied bilaterally. He reports swelling has improved significantly. Erythema and tenderness has improved. He has not been checking his blood pressure. Denies fever or chills. Denies chest pain, shortness of breath, focal weakness, palpitations, or dizziness.  ? ?ROS ?As per HPI. ? ?   ?Objective:  ?  ?BP (!) 161/82   Pulse (!) 54   Temp 97.9 ?F (36.6 ?C) (Temporal)   Ht '6\' 1"'$  (1.854 m)   Wt 250 lb 6 oz (113.6 kg)   BMI 33.03 kg/m?  ?BP Readings from Last 3 Encounters:  ?06/02/21 (!) 161/82  ?05/30/21 (!) 170/93  ?03/24/21 138/88  ? ?  ? ?Physical Exam ?Vitals and nursing note reviewed.  ?Constitutional:   ?   General: He is not in acute distress. ?   Appearance: He is not ill-appearing, toxic-appearing or diaphoretic.  ?Cardiovascular:  ?   Rate and Rhythm: Normal rate and regular rhythm.  ?   Heart sounds: Normal heart sounds. No murmur heard. ?Pulmonary:  ?   Effort: Pulmonary effort is normal. No respiratory distress.  ?   Breath sounds: Normal breath sounds.  ?Musculoskeletal:  ?   Right lower leg: 2+ Pitting Edema present.  ?   Left lower leg: 2+ Pitting Edema present.  ?Skin: ?   General: Skin is warm and dry.  ?   Findings: Wound present.  ?   Comments: Multiple shallow ulcers to bilateral lower extremities. No warmth or tenderness present today. Bilateral mild erythema. Serosanguinous drainage present.   ?Neurological:  ?   Mental Status: He is alert and oriented to person, place, and time. Mental status is at baseline.  ?Psychiatric:     ?   Mood and Affect: Mood normal.     ?   Behavior: Behavior normal.  ? ? ?No results found for any visits on 06/02/21. ? ? ?    ?Assessment & Plan:  ? ?Landin was seen today for cellulitis. ? ?Diagnoses and all orders for this visit: ? ?Cellulitis of right lower extremity ?Venous stasis ulcer of leg without varicose veins (HCC) ?Peripheral edema ?Improving but not well controlled. Unna boots reapplied today. Continue keflex as prescribed.  ?-     Apply unna boot ? ?Essential hypertension ?Not at goal. Asymptomatic. Monitor BP at home and bring log to next appt.  ? ? ?Return in about 5 days (around 06/07/2021), or if symptoms worsen or fail to improve. ? ?The patient indicates understanding of these issues and agrees with the plan. ? ?Gwenlyn Perking, FNP ? ? ?

## 2021-06-02 NOTE — Patient Instructions (Signed)
Unna Boot Care An Unna boot is a type of bandage (dressing) for the foot and leg. The dressing is a gauze wrap that is soaked with a type of medicine called zinc oxide. The gauze may also include other lotions and medicines that help in wound healing, such as calamine. An Unna boot may be used to treat: Open sores (ulcers) on the foot, heel, or leg. Swelling from disorders that affect the veins or lymphatic system (lymphedema). Skin conditions such as chronic inflammation caused by poor blood flow (stasis dermatitis). The dressing is applied by a health care provider. The gauze is wrapped around your lower extremity in several layers, usually starting at the toes and going upward to the knee. A dry outer wrap goes over the medicated wrap for support and compression.  Before applying the Unna boot, your health care provider will clean your leg and foot and may apply an antibiotic ointment. You may be asked to raise (elevate) your leg for a while to reduce swelling before the boot is applied. The boot will dry and harden after it is applied. The boot may need to be changed or replaced about twice a week. Follow these instructions at home: Boot care Wear the Unna boot as told by your health care provider. You may need to wear a slipper or shoe over the boot that is one or two sizes larger than normal. Check the skin around the boot every day. Tell your health care provider about any concerns. Do not stick anything inside the boot to scratch your skin. Doing that increases your risk of infection. Keep your Unna boot clean and dry. Check every day for signs of infection. Check for: Redness, swelling, or pain in your foot or toes. Fluid or blood coming from the boot. Pus or a bad smell coming from the boot. Remove the boot and call your health care provider if you have signs of poor blood flow, such as: Your toes tingle or become numb. Your toes turn cold or turn blue or pale. Your toes are more  swollen or painful. You are unable to move your toes. Activity You may walk with the boot once it has dried. Ask your health care provider how much walking is safe for you. Avoid sitting for a long time without moving. Get up to take short walks as told by your health care provider. This is important to improve blood flow. Bathing Do not take baths, swim, or use a hot tub until your health care provider approves. Ask your health care provider if you may take showers. If your health care provider approves a bath or a shower, do not let the Unna boot get wet. If you take a shower, cover the boot with a watertight covering. If you take a bath, keep your leg with the boot out of the tub. General instructions Keep your leg elevated above the level of your heart while you are sitting or lying down. This will decrease swelling. Do not sit with your knee bent for long periods of time. Take over-the-counter and prescription medicines only as told by your health care provider. Do not use any products that contain nicotine or tobacco, such as cigarettes, e-cigarettes, and chewing tobacco. These can delay healing. If you need help quitting, ask your health care provider. Keep all follow-up visits as told by your health care provider. This is important. Contact a health care provider if: Your skin feels itchy inside the boot. You have a burning sensation, a   rash, or itchy, red, swollen areas of skin (hives) in the boot area. You have a fever or chills. You have any signs of infection, such as: New redness, swelling, or pain. More fluid or blood coming from the boot. Pus or a bad smell coming from the boot. You have increased numbness or pain in your foot or toes. You have any changes in skin color on your foot or toes, such as the skin turning blue or pale or developing patchy areas with spots. Your boot has been damaged or feels like it is no longer fitting properly. Summary An Unna boot is a type of  bandage (dressing) system for the foot and leg. The dressing is a gauze wrap that is soaked with a type of medicine (zinc oxide) to treat foot, heel, or leg ulcers, swelling from disorders that affect the veins or lymphatic system (lymphedema), and skin conditions caused by poor blood flow (stasis dermatitis). This dressing is applied by a health care provider. After it is applied, the boot will dry and harden. The boot may need to be changed or replaced about twice a week. Let your health care provider know if you have any signs of poor blood flow or infection. This information is not intended to replace advice given to you by your health care provider. Make sure you discuss any questions you have with your health care provider. Document Revised: 11/17/2020 Document Reviewed: 11/17/2020 Elsevier Patient Education  2023 [employer]  

## 2021-06-05 ENCOUNTER — Encounter: Payer: Self-pay | Admitting: Family Medicine

## 2021-06-06 ENCOUNTER — Telehealth: Payer: Self-pay | Admitting: Family Medicine

## 2021-06-06 NOTE — Telephone Encounter (Signed)
Refer to my chart message ? ?Tifanys not is not completed- please let us know when completed so we can fax.   ? ?Let daughter know when this has been done ?

## 2021-06-06 NOTE — Telephone Encounter (Signed)
D[facility] stated that [facility] needed [facility] that concern the patients [facility] because they are trying to get someone to come out and help him as he is unable to bend over. Please call daughter back with any further questions.  ?

## 2021-06-06 NOTE — Telephone Encounter (Signed)
I have finished my note.  ?

## 2021-06-07 ENCOUNTER — Other Ambulatory Visit: Payer: Self-pay | Admitting: Family Medicine

## 2021-06-07 ENCOUNTER — Encounter: Payer: Self-pay | Admitting: Family Medicine

## 2021-06-07 ENCOUNTER — Ambulatory Visit: Payer: Medicare Other

## 2021-06-07 ENCOUNTER — Ambulatory Visit: Payer: Medicare Other | Admitting: Family Medicine

## 2021-06-07 ENCOUNTER — Ambulatory Visit (INDEPENDENT_AMBULATORY_CARE_PROVIDER_SITE_OTHER): Payer: Medicare Other | Admitting: Family Medicine

## 2021-06-07 VITALS — BP 162/84 | HR 60 | Temp 97.4°F | Ht 73.0 in | Wt 253.8 lb

## 2021-06-07 DIAGNOSIS — I872 Venous insufficiency (chronic) (peripheral): Secondary | ICD-10-CM | POA: Diagnosis not present

## 2021-06-07 DIAGNOSIS — I10 Essential (primary) hypertension: Secondary | ICD-10-CM

## 2021-06-07 DIAGNOSIS — R609 Edema, unspecified: Secondary | ICD-10-CM

## 2021-06-07 DIAGNOSIS — L97909 Non-pressure chronic ulcer of unspecified part of unspecified lower leg with unspecified severity: Secondary | ICD-10-CM

## 2021-06-07 DIAGNOSIS — E782 Mixed hyperlipidemia: Secondary | ICD-10-CM

## 2021-06-07 DIAGNOSIS — L97819 Non-pressure chronic ulcer of other part of right lower leg with unspecified severity: Secondary | ICD-10-CM

## 2021-06-07 MED ORDER — AMLODIPINE BESYLATE 5 MG PO TABS: 5.0000 mg | ORAL_TABLET | Freq: Every day | ORAL | 2 refills | Status: DC

## 2021-06-07 NOTE — Telephone Encounter (Signed)
Aware will be faxed after the visit today so that note can be sent too ?

## 2021-06-07 NOTE — Progress Notes (Signed)
? ?Assessment & Plan:  ?1. Venous stasis ulcer of leg without varicose veins (HCC) ?Improving.  Unna boot reapplied today. ? ?2. Peripheral edema ?Currently resolved with Unna boots. ? ?3. Essential hypertension ?Uncontrolled.  Adding amlodipine 5 mg daily. ?- amLODipine (NORVASC) 5 MG tablet; Take 1 tablet (5 mg total) by mouth daily.  Dispense: 30 tablet; Refill: 2 ? ? ?Follow up plan: Return in about 1 week (around 06/14/2021) for Unna boots & HTN. ? ?Hendricks Limes, MSN, APRN, FNP-C ?[place] ? ?Subjective:  ? ?Patient ID: [name], male    DOB: April 06, 1929, 88 y.o.   MRN: [id number] ? ?HPI: ?[name] is a 88 y.o. male presenting on 06/07/2021 for double unna boot ? ?Patient is here to have his legs reassessed.  He has multiple venous stasis ulcers and has been wearing Unna boots since 05/30/2021.  They were last changed on 06/02/2021.  He was previously treated with cephalexin for cellulitis of the right lower extremity. ? ?Patient also has uncontrolled blood pressure.  He does not monitor his blood pressure at home. ? ? ?ROS: Negative unless specifically indicated above in HPI.  ? ?Relevant past medical history reviewed and updated as indicated.  ? ?Allergies and medications reviewed and updated. ? ? ?Current Outpatient Medications:  ?  apixaban (ELIQUIS) 5 MG TABS tablet, Take 1 tablet (5 mg total) by mouth 2 (two) times daily., Disp: 180 tablet, Rfl: 1 ?  fish oil-omega-3 fatty acids 1000 MG capsule, Take 2 g by mouth daily., Disp: , Rfl:  ?  furosemide (LASIX) 20 MG tablet, Take 1 tablet (20 mg total) by mouth daily. FOR FLUID (NEEDS TO BE SEEN BEFORE NEXT REFILL), Disp: 30 tablet, Rfl: 0 ?  lisinopril (ZESTRIL) 40 MG tablet, Take 1 tablet (40 mg total) by mouth daily. (NEEDS TO BE SEEN BEFORE NEXT REFILL), Disp: 30 tablet, Rfl: 0 ?  omeprazole (PRILOSEC) 20 MG capsule, Take 1 capsule (20 mg total) by mouth daily., Disp: 90 capsule, Rfl: 1 ?  polyethylene glycol (MIRALAX /  GLYCOLAX) 17 g packet, Take 17 g by mouth daily as needed for mild constipation., Disp: 14 each, Rfl: 0 ?  rosuvastatin (CRESTOR) 5 MG tablet, TAKE 1/2 TABLET ON MONDAY, WEDNESDAY AND FRIDAY AT 6PM (NEEDS TO BE SEEN BEFORE NEXT REFILL), Disp: 6 tablet, Rfl: 0 ?  triamcinolone ointment (KENALOG) 0.1 %, Apply 1 application topically 2 (two) times daily., Disp: 454 g, Rfl: 0 ? ?No Known Allergies ? ?Objective:  ? ?BP (!) 162/84   Pulse 60   Temp (!) 97.4 ?F (36.3 ?C) (Temporal)   Ht '6\' 1"'$  (1.854 m)   Wt 253 lb 12.8 oz (115.1 kg)   SpO2 96%   BMI 33.48 kg/m?   ? ?Physical Exam ?Vitals reviewed.  ?Constitutional:   ?   General: He is not in acute distress. ?   Appearance: Normal appearance. He is not ill-appearing, toxic-appearing or diaphoretic.  ?HENT:  ?   Head: Normocephalic and atraumatic.  ?Eyes:  ?   General: No scleral icterus.    ?   Right eye: No discharge.     ?   Left eye: No discharge.  ?   Conjunctiva/sclera: Conjunctivae normal.  ?Cardiovascular:  ?   Rate and Rhythm: Normal rate.  ?Pulmonary:  ?   Effort: Pulmonary effort is normal. No respiratory distress.  ?Musculoskeletal:     ?   General: Normal range of motion.  ?   Cervical back: Normal range of  motion.  ?   Right lower leg: No edema.  ?   Left lower leg: No edema.  ?Skin: ?   General: Skin is warm and dry.  ?   Findings: Wound (improving; previously had multiple wounds, some of which are now healed. There are a few remaining bilaterally.) present. No erythema.  ?Neurological:  ?   Mental Status: He is alert and oriented to person, place, and time. Mental status is at baseline.  ?Psychiatric:     ?   Mood and Affect: Mood normal.     ?   Behavior: Behavior normal.     ?   Thought Content: Thought content normal.     ?   Judgment: Judgment normal.  ? ? ? ? ? ? ?

## 2021-06-14 ENCOUNTER — Encounter: Payer: Self-pay | Admitting: Family Medicine

## 2021-06-14 ENCOUNTER — Ambulatory Visit (INDEPENDENT_AMBULATORY_CARE_PROVIDER_SITE_OTHER): Payer: Medicare Other | Admitting: Family Medicine

## 2021-06-14 VITALS — BP 174/72 | HR 71 | Temp 97.7°F | Ht 73.0 in | Wt 250.4 lb

## 2021-06-14 DIAGNOSIS — I872 Venous insufficiency (chronic) (peripheral): Secondary | ICD-10-CM

## 2021-06-14 DIAGNOSIS — L97819 Non-pressure chronic ulcer of other part of right lower leg with unspecified severity: Secondary | ICD-10-CM | POA: Diagnosis not present

## 2021-06-14 DIAGNOSIS — L989 Disorder of the skin and subcutaneous tissue, unspecified: Secondary | ICD-10-CM | POA: Diagnosis not present

## 2021-06-14 DIAGNOSIS — I10 Essential (primary) hypertension: Secondary | ICD-10-CM

## 2021-06-14 DIAGNOSIS — L97929 Non-pressure chronic ulcer of unspecified part of left lower leg with unspecified severity: Secondary | ICD-10-CM

## 2021-06-14 NOTE — Progress Notes (Signed)
? ?Assessment & Plan:  ?1. Venous stasis ulcer of leg without varicose veins (HCC) ?Improving. Unna boots reapplied bilaterally today.  ? ?2. Essential hypertension ?Continue current regimen. Will adjust next week if he remains elevated. ? ?3. Skin lesion of cheek ?Encouraged patient to see dermatologist regarding skin lesion. Discussed possibility of skin cancer.  ? ? ?Follow up plan: Return in about 1 week (around 06/21/2021) for unna boots (30 minute appointment). ? ?Hendricks Limes, MSN, APRN, FNP-C ?[place] [place] ? ?Subjective:  ? ?Patient ID: [name], male    DOB: [birth year], 88 y.o.   MRN: [id number] ? ?HPI: ?[name] is a 88 y.o. male presenting on 06/14/2021 for double unna boot and Hypertension (1 week follow up) ? ?Patient is here to have his legs reassessed.  He has multiple venous stasis ulcers and has been wearing Unna boots since 05/30/2021.  They were last changed on 06/07/2021.  He was previously treated with cephalexin for cellulitis of the right lower extremity. ? ?Patient also has uncontrolled blood pressure.  He does not monitor his blood pressure at home. He was started on amlodipine 5 mg daily last week.  ? ? ?ROS: Negative unless specifically indicated above in HPI.  ? ?Relevant past medical history reviewed and updated as indicated.  ? ?Allergies and medications reviewed and updated. ? ? ?Current Outpatient Medications:  ?  amLODipine (NORVASC) 5 MG tablet, Take 1 tablet (5 mg total) by mouth daily., Disp: 30 tablet, Rfl: 2 ?  apixaban (ELIQUIS) 5 MG TABS tablet, Take 1 tablet (5 mg total) by mouth 2 (two) times daily., Disp: 180 tablet, Rfl: 1 ?  fish oil-omega-3 fatty acids 1000 MG capsule, Take 2 g by mouth daily., Disp: , Rfl:  ?  furosemide (LASIX) 20 MG tablet, Take 1 tablet (20 mg total) by mouth daily. FOR FLUID (NEEDS TO BE SEEN BEFORE NEXT REFILL), Disp: 30 tablet, Rfl: 0 ?  lisinopril (ZESTRIL) 40 MG tablet, TAKE ONE TABLET ONCE DAILY, Disp: 30 tablet,  Rfl: 0 ?  omeprazole (PRILOSEC) 20 MG capsule, Take 1 capsule (20 mg total) by mouth daily., Disp: 90 capsule, Rfl: 1 ?  polyethylene glycol (MIRALAX / GLYCOLAX) 17 g packet, Take 17 g by mouth daily as needed for mild constipation., Disp: 14 each, Rfl: 0 ?  rosuvastatin (CRESTOR) 5 MG tablet, TAKE 1/2 TABLET ON MONDAY, WEDNESDAY AND FRIDAY AT 6PM, Disp: 6 tablet, Rfl: 0 ?  triamcinolone ointment (KENALOG) 0.1 %, Apply 1 application topically 2 (two) times daily., Disp: 454 g, Rfl: 0 ? ?No Known Allergies ? ?Objective:  ? ?BP (!) 174/72   Pulse 71   Temp 97.7 ?F (36.5 ?C) (Temporal)   Ht '6\' 1"'$  (1.854 m)   Wt 250 lb 6.4 oz (113.6 kg)   SpO2 98%   BMI 33.04 kg/m?   ? ?Physical Exam ?Vitals reviewed.  ?Constitutional:   ?   General: He is not in acute distress. ?   Appearance: Normal appearance. He is not ill-appearing, toxic-appearing or diaphoretic.  ?HENT:  ?   Head: Normocephalic and atraumatic.  ?Eyes:  ?   General: No scleral icterus.    ?   Right eye: No discharge.     ?   Left eye: No discharge.  ?   Conjunctiva/sclera: Conjunctivae normal.  ?Cardiovascular:  ?   Rate and Rhythm: Normal rate.  ?Pulmonary:  ?   Effort: Pulmonary effort is normal. No respiratory distress.  ?Musculoskeletal:     ?  General: Normal range of motion.  ?   Cervical back: Normal range of motion.  ?   Right lower leg: No edema.  ?   Left lower leg: No edema.  ?Skin: ?   General: Skin is warm and dry.  ?   Findings: Lesion (raised skin lesion to left cheek) and wound (improving; previously had multiple wounds, most of which are now healed. He does have one small remaining ulcer on the right shin. There is a piece of skin on the left shin that came off with the unna boot resulting in a new wound.) present. No erythema.  ?Neurological:  ?   Mental Status: He is alert and oriented to person, place, and time. Mental status is at baseline.  ?Psychiatric:     ?   Mood and Affect: Mood normal.     ?   Behavior: Behavior normal.     ?    Thought Content: Thought content normal.     ?   Judgment: Judgment normal.  ? ? ? ? ? ? ?

## 2021-06-14 NOTE — Patient Instructions (Addendum)
Schedule an appointment with Dr. Tarri Glenn to look at the spot on your cheek. ?Phone: [phone] ?

## 2021-06-20 ENCOUNTER — Ambulatory Visit: Payer: Medicare Other | Admitting: Nurse Practitioner

## 2021-06-20 ENCOUNTER — Ambulatory Visit: Payer: Medicare Other | Admitting: Family Medicine

## 2021-06-22 DIAGNOSIS — C44319 Basal cell carcinoma of skin of other parts of face: Secondary | ICD-10-CM | POA: Diagnosis not present

## 2021-06-22 DIAGNOSIS — C44329 Squamous cell carcinoma of skin of other parts of face: Secondary | ICD-10-CM | POA: Diagnosis not present

## 2021-06-28 ENCOUNTER — Telehealth: Payer: Self-pay | Admitting: Family Medicine

## 2021-06-28 NOTE — Telephone Encounter (Signed)
Is this ok for patient to hold eliquis for this procedure?

## 2021-06-28 NOTE — Telephone Encounter (Signed)
That is fine 

## 2021-06-28 NOTE — Telephone Encounter (Signed)
Pt aware   Dr. Tarri Glenn - [facility], [place]  Called and aware also  Will hold eliquis for 2 days prior to procedure, and resume day after the procedure.

## 2021-06-29 ENCOUNTER — Encounter: Payer: Self-pay | Admitting: Family Medicine

## 2021-06-29 ENCOUNTER — Ambulatory Visit (INDEPENDENT_AMBULATORY_CARE_PROVIDER_SITE_OTHER): Payer: Medicare Other | Admitting: Family Medicine

## 2021-06-29 VITALS — BP 170/75 | HR 75 | Temp 98.0°F | Ht 73.0 in | Wt 249.1 lb

## 2021-06-29 DIAGNOSIS — Z5189 Encounter for other specified aftercare: Secondary | ICD-10-CM

## 2021-06-29 DIAGNOSIS — I10 Essential (primary) hypertension: Secondary | ICD-10-CM | POA: Diagnosis not present

## 2021-06-29 NOTE — Patient Instructions (Signed)

## 2021-06-29 NOTE — Progress Notes (Signed)
   Acute Office Visit  Subjective:     Patient ID: [name], male    DOB: 06-Nov-1929, 88 y.o.   MRN: [id number]  Chief Complaint  Patient presents with   Wound Check    Wound Check  Patient is in today for a wound check. He had a skin lesion removed on his face last week by Dr. Tarri Glenn. He denies pain, drainage, swelling, or erythema. It has been kept bandage with Vaseline and a simple dressing. He would just like to have it looked at today to make sure that it is healing ok. He will follow up with Dr. Tarri Glenn next week.   Denies chest pain, shortness of breath, edema, or focal weakness. He has not been checking his BP at home.   ROS As per HPI.      Objective:    BP (!) 174/73   Pulse 75   Temp 98 F (36.7 C) (Temporal)   Ht '6\' 1"'$  (1.854 m)   Wt 249 lb 2 oz (113 kg)   SpO2 99%   BMI 32.87 kg/m  BP Readings from Last 3 Encounters:  06/29/21 (!) 170/75  06/14/21 (!) 174/72  06/07/21 (!) 162/84      Physical Exam Vitals and nursing note reviewed.  Constitutional:      General: He is not in acute distress.    Appearance: He is not ill-appearing, toxic-appearing or diaphoretic.  HENT:     Head:     Comments: Well healing wound to left cheek abotu 0.5cm in diameter. No signs of infection.  Cardiovascular:     Rate and Rhythm: Normal rate and regular rhythm.     Heart sounds: Normal heart sounds. No murmur heard. Pulmonary:     Effort: Pulmonary effort is normal. No respiratory distress.     Breath sounds: Normal breath sounds.  Skin:    General: Skin is warm and dry.  Neurological:     Mental Status: He is alert and oriented to person, place, and time. Mental status is at baseline.  Psychiatric:        Mood and Affect: Mood normal.        Behavior: Behavior normal.    No results found for any visits on 06/29/21.      Assessment & Plan:   [place] was seen today for wound check.  Diagnoses and all orders for this visit:  Visit for wound  check Healing well. Vaseline with simple bandage applied. Keep follow up appt with derm next week.   Essential hypertension Elevated today in office. Asymptomatic. Monitor BP at home and notify office for persistently elevated readings.   Return if symptoms worsen or fail to improve.  The patient indicates understanding of these issues and agrees with the plan.  Gwenlyn Perking, FNP

## 2021-07-05 ENCOUNTER — Ambulatory Visit: Payer: Medicare Other | Admitting: Family Medicine

## 2021-07-06 DIAGNOSIS — C44329 Squamous cell carcinoma of skin of other parts of face: Secondary | ICD-10-CM | POA: Diagnosis not present

## 2021-07-07 ENCOUNTER — Ambulatory Visit (INDEPENDENT_AMBULATORY_CARE_PROVIDER_SITE_OTHER): Payer: Medicare Other | Admitting: Nurse Practitioner

## 2021-07-07 ENCOUNTER — Telehealth: Payer: Self-pay | Admitting: *Deleted

## 2021-07-07 ENCOUNTER — Encounter: Payer: Self-pay | Admitting: Nurse Practitioner

## 2021-07-07 ENCOUNTER — Other Ambulatory Visit: Payer: Self-pay | Admitting: Family Medicine

## 2021-07-07 VITALS — BP 140/72 | HR 57

## 2021-07-07 DIAGNOSIS — I872 Venous insufficiency (chronic) (peripheral): Secondary | ICD-10-CM | POA: Diagnosis not present

## 2021-07-07 DIAGNOSIS — I878 Other specified disorders of veins: Secondary | ICD-10-CM

## 2021-07-07 DIAGNOSIS — L97909 Non-pressure chronic ulcer of unspecified part of unspecified lower leg with unspecified severity: Secondary | ICD-10-CM | POA: Diagnosis not present

## 2021-07-07 DIAGNOSIS — I10 Essential (primary) hypertension: Secondary | ICD-10-CM

## 2021-07-07 MED ORDER — DOXYCYCLINE HYCLATE 100 MG PO TABS: 100.0000 mg | ORAL_TABLET | Freq: Two times a day (BID) | ORAL | 0 refills | Status: DC

## 2021-07-07 NOTE — Patient Instructions (Addendum)
Skin Tear A skin tear is a wound in which the top layers of skin have peeled off from the deeper skin or tissues underneath. This is a common problem as people get older because the skin becomes thinner and more fragile. In addition, some medicines, such as oral corticosteroids, can lead to thinning skin if they are taken for long periods of time. A skin tear is often repaired with tape or skin adhesive strips. Depending on the location of the wound, a bandage (dressing) may be applied over the tape or adhesive strips. Follow these instructions at home: Wound care  Clean the wound as told by your health care provider. You may be instructed to keep the wound dry for the first few days. If you are told to clean the wound: Wash the wound as told by your health care provider. This may include using mild soap and water, a wound cleanser, or a salt-water (saline) solution. If using soap, rinse the wound with water to remove all soap. Do not rub the wound dry. Pat it gently with a clean towel or let it air-dry. Change any dressings as told by your health care provider. This may include changing the dressing if it gets wet, gets dirty, or starts to smell bad. Wash your hands with soap and water for at least 20 seconds before and after you change your bandage (dressing). If soap and water are not available, use hand sanitizer. Leave tape or skin adhesive strips in place. These skin closures may need to stay in place for 2 weeks or longer. If adhesive strip edges start to loosen and curl up, you may trim the loose edges. Do not remove adhesive strips completely unless your health care provider tells you to do that. Check your wound every day for signs of infection. Check for: Redness, swelling, or pain. More fluid or blood. Warmth. Pus or a bad smell. Do not scratch or pick at the wound. Protect the injured area until it has healed. Medicines Take or apply over-the-counter and prescription medicines only  as told by your health care provider. If you were prescribed an antibiotic medicine, take or apply it as told by your health care provider. Do not stop using the antibiotic even if your condition improves. General instructions Cellulitis, Adult  Cellulitis is a skin infection. The infected area is usually warm, red, swollen, and tender. This condition occurs most often in the arms and lower legs. The infection can travel to the muscles, blood, and underlying tissue and become serious. It is very important to get treated for this condition. What are the causes? Cellulitis is caused by bacteria. The bacteria enter through a break in the skin, such as a cut, burn, insect bite, open sore, or crack. What increases the risk? This condition is more likely to occur in people who: Have a weak body defense system (immune system). Have open wounds on the skin, such as cuts, burns, bites, and scrapes. Bacteria can enter the body through these open wounds. Are older than 86 years of age. Have diabetes. Have a type of long-lasting (chronic) liver disease (cirrhosis) or kidney disease. Are obese. Have a skin condition such as: Itchy rash (eczema). Slow movement of blood in the veins (venous stasis). Fluid buildup below the skin (edema). Have had radiation therapy. Use IV drugs. What are the signs or symptoms? Symptoms of this condition include: Redness, streaking, or spotting on the skin. Swollen area of the skin. Tenderness or pain when an area of  the skin is touched. Warm skin. A fever. Chills. Blisters. How is this diagnosed? This condition is diagnosed based on a medical history and physical exam. You may also have tests, including: Blood tests. Imaging tests. How is this treated? Treatment for this condition may include: Medicines, such as antibiotic medicines or medicines to treat allergies (antihistamines). Supportive care, such as rest and application of cold or warm cloths  (compresses) to the skin. Hospital care, if the condition is severe. The infection usually starts to get better within 1-2 days of treatment. Follow these instructions at home:  Medicines Take over-the-counter and prescription medicines only as told by your health care provider. If you were prescribed an antibiotic medicine, take it as told by your health care provider. Do not stop taking the antibiotic even if you start to feel better. General instructions Drink enough fluid to keep your urine pale yellow. Do not touch or rub the infected area. Raise (elevate) the infected area above the level of your heart while you are sitting or lying down. Apply warm or cold compresses to the affected area as told by your health care provider. Keep all follow-up visits as told by your health care provider. This is important. These visits let your health care provider make sure a more serious infection is not developing. Contact a health care provider if: You have a fever. Your symptoms do not begin to improve within 1-2 days of starting treatment. Your bone or joint underneath the infected area becomes painful after the skin has healed. Your infection returns in the same area or another area. You notice a swollen bump in the infected area. You develop new symptoms. You have a general ill feeling (malaise) with muscle aches and pains. Get help right away if: Your symptoms get worse. You feel very sleepy. You develop vomiting or diarrhea that persists. You notice red streaks coming from the infected area. Your red area gets larger or turns dark in color. These symptoms may represent a serious problem that is an emergency. Do not wait to see if the symptoms will go away. Get medical help right away. Call your local emergency services (911 in the [place] Do not drive yourself to the hospital. Summary Cellulitis is a skin infection. This condition occurs most often in the arms and lower legs. Treatment  for this condition may include medicines, such as antibiotic medicines or antihistamines. Take over-the-counter and prescription medicines only as told by your health care provider. If you were prescribed an antibiotic medicine, do not stop taking the antibiotic even if you start to feel better. Contact a health care provider if your symptoms do not begin to improve within 1-2 days of starting treatment or your symptoms get worse. Keep all follow-up visits as told by your health care provider. This is important. These visits let your health care provider make sure that a more serious infection is not developing. This information is not intended to replace advice given to you by your health care provider. Make sure you discuss any questions you have with your health care provider. Document Revised: 11/03/2020 Document Reviewed: 11/03/2020 Elsevier Patient Education  [place] the dressing dry as told by your health care provider. Do not take baths, swim, use a hot tub, or do anything that puts your wound underwater until your health care provider approves. Ask your health care provider if you may take showers. You may only be allowed to take sponge baths. Keep all follow-up visits. This  is important. Contact a health care provider if: You have redness, swelling, or pain around your wound. You have more fluid or blood coming from your wound. Your wound, or the area around your wound, feels warm to the touch. You have pus or a bad smell coming from your wound. Get help right away if: You have a red streak that goes away from the skin tear. You have a fever and chills, and your symptoms suddenly get worse. Summary A skin tear is a wound in which the top layers of skin have peeled off from the deeper skin or tissues underneath. A skin tear is often repaired with tape or skin adhesive strips, and a bandage (dressing) may be applied over the tape or the adhesive strips. Change any  dressings as told by your health care provider. Take or apply over-the-counter and prescription medicines only as told by your health care provider. Contact a health care provider if you have signs of infection. This information is not intended to replace advice given to you by your health care provider. Make sure you discuss any questions you have with your health care provider. Document Revised: 04/29/2019 Document Reviewed: 04/29/2019 Elsevier Patient Education  [place].

## 2021-07-07 NOTE — Telephone Encounter (Signed)
Lmtcb.

## 2021-07-07 NOTE — Progress Notes (Signed)
Acute Office Visit  Subjective:     Patient ID: [name], male    DOB: July 09, 1929, 88 y.o.   MRN: [id number]  Chief Complaint  Patient presents with   Skin Tear    HPI  Patient is in today for left lower leg skin tear and cellulitis. History of venous stasis ulcer with bilateral edema. No swelling today, mild edema, warm extremities and erythema.  Patient is afebrile, with no changes in mental status.   [name] is a 88 y.o. male who presents with erythema and tenderness.  Location: Left lower leg  Onset: acute  Duration: 1 week and symptoms are worsening  Associated symptoms: Red  Recent treatment: none and antibiotics  Functional status affected: no   Allergies: Patient has no known allergies. Patient Active Problem List   Diagnosis Date Noted   Cellulitis of left lower extremity 10/26/2020   Cellulitis of right lower extremity 10/26/2020   OA (osteoarthritis) of hip 05/11/2020   Osteoarthritis of right hip 05/11/2020   Preoperative clearance 05/04/2020   Atrial fibrillation ([place]) 04/28/2020   Venous stasis of lower extremity 10/23/2019   Obesity (BMI 30.0-34.9) 07/18/2017   Essential hypertension 08/16/2014   Hyperlipemia 08/16/2014   Peripheral edema 06/10/2012   Vitamin D deficiency 05/10/2010   Arthritis 05/10/2010   Review of Systems  Constitutional: Negative.   HENT: Negative.    Eyes: Negative.   Respiratory: Negative.    Cardiovascular: Negative.   Skin:        Redness and skin tear on left lower leg  All other systems reviewed and are negative.      Objective:    BP 140/72   Pulse (!) 57   SpO2 98%  BP Readings from Last 3 Encounters:  07/07/21 140/72  06/29/21 (!) 170/75  06/14/21 (!) 174/72   Wt Readings from Last 3 Encounters:  06/29/21 249 lb 2 oz (113 kg)  06/14/21 250 lb 6.4 oz (113.6 kg)  06/07/21 253 lb 12.8 oz (115.1 kg)      Physical Exam Vitals and nursing note reviewed.  Constitutional:      Appearance: Normal  appearance.  HENT:     Right Ear: External ear normal.     Left Ear: External ear normal.     Nose: Nose normal.     Mouth/Throat:     Mouth: Mucous membranes are moist.  Eyes:     Conjunctiva/sclera: Conjunctivae normal.  Cardiovascular:     Rate and Rhythm: Normal rate and regular rhythm.     Pulses: Normal pulses.     Heart sounds: Normal heart sounds.  Pulmonary:     Effort: Pulmonary effort is normal.     Breath sounds: Normal breath sounds.  Abdominal:     General: Bowel sounds are normal.  Skin:    Findings: Erythema present.     Comments: Skin tear left lower leg  Neurological:     Mental Status: He is alert and oriented to person, place, and time.  Psychiatric:        Behavior: Behavior normal.    No results found for any visits on 07/07/21.      Assessment & Plan:  .  Problem List Items Addressed This Visit   None Visit Diagnoses     Venous stasis ulcer of leg without varicose veins (HCC)    -  Primary       Meds ordered this encounter  Medications   doxycycline (VIBRA-TABS) 100 MG tablet  Sig: Take 1 tablet (100 mg total) by mouth 2 (two) times daily.    Dispense:  14 tablet    Refill:  0    Order Specific Question:   Supervising Provider    Answer:   Claretta Fraise [[id number]]    Return in about 5 days (around 07/12/2021) for wound check with PCP.  Ivy Lynn, NP

## 2021-07-07 NOTE — Telephone Encounter (Signed)
Were the wounds healed?

## 2021-07-07 NOTE — Telephone Encounter (Signed)
VM from [place] w/ [facility] Saw pt on Tuesday, left unna boots off, left leg had swelling above unna boot with skin mark, no other signs, right leg was fine. Will be wearing compression hose.

## 2021-07-07 NOTE — Telephone Encounter (Signed)
[name] said there was just the scratch at the top from the unna boot otherwise they looked good.

## 2021-07-12 ENCOUNTER — Ambulatory Visit (INDEPENDENT_AMBULATORY_CARE_PROVIDER_SITE_OTHER): Payer: Medicare Other | Admitting: Family Medicine

## 2021-07-12 ENCOUNTER — Encounter: Payer: Self-pay | Admitting: Family Medicine

## 2021-07-12 VITALS — BP 191/62 | HR 56 | Temp 97.5°F | Ht 73.0 in | Wt 251.0 lb

## 2021-07-12 DIAGNOSIS — L03116 Cellulitis of left lower limb: Secondary | ICD-10-CM | POA: Diagnosis not present

## 2021-07-12 DIAGNOSIS — L97829 Non-pressure chronic ulcer of other part of left lower leg with unspecified severity: Secondary | ICD-10-CM

## 2021-07-12 DIAGNOSIS — I872 Venous insufficiency (chronic) (peripheral): Secondary | ICD-10-CM

## 2021-07-12 DIAGNOSIS — I10 Essential (primary) hypertension: Secondary | ICD-10-CM | POA: Diagnosis not present

## 2021-07-12 DIAGNOSIS — L03115 Cellulitis of right lower limb: Secondary | ICD-10-CM

## 2021-07-12 NOTE — Progress Notes (Signed)
   Acute Office Visit  Subjective:     Patient ID: [name], male    DOB: 04/16/1929, 88 y.o.   MRN: [id number]  Chief Complaint  Patient presents with   Cellulitis    HPI Patient is in today for bilateral lower leg ulcers. He was seen 4 days ago for cellulitis and started on doxycyline. He now has an ulcer on his right leg as well. Swelling, erythema, and tenderness is present bilaterally. There is clear drainage. Denies fever. He has not been wearing compression socks or dressings.   He isn't sure if he has taken his BP medications today. Denies chest pain, shortness of breath, or focal weakness.   ROS As per HPI.      Objective:    BP (!) 191/62   Pulse (!) 56   Temp (!) 97.5 F (36.4 C) (Temporal)   Ht '6\' 1"'$  (1.854 m)   Wt 251 lb (113.9 kg)   SpO2 96%   BMI 33.12 kg/m  BP Readings from Last 3 Encounters:  07/12/21 (!) 191/62  07/07/21 140/72  06/29/21 (!) 170/75      Physical Exam Vitals and nursing note reviewed.  Constitutional:      General: He is not in acute distress.    Appearance: He is not ill-appearing, toxic-appearing or diaphoretic.  Cardiovascular:     Rate and Rhythm: Normal rate and regular rhythm.     Heart sounds: Normal heart sounds. No murmur heard. Pulmonary:     Effort: Pulmonary effort is normal. No respiratory distress.     Breath sounds: Normal breath sounds.  Musculoskeletal:     Right lower leg: Edema present.     Left lower leg: Edema present.     Comments: 2+ pitting BLE with erythema and warmth  Skin:    General: Skin is warm and dry.     Findings: Lesion (5 cm x 4 cm superficial ulcer to anterior LLE with pink wound base and clear exudate. 1 cm x 1 cm superficial ulcer to anterior RLE with pink wound base and clear exudate. Bilateral swelling with warmth and tenderness to LEs.) present.  Neurological:     Mental Status: He is alert and oriented to person, place, and time. Mental status is at baseline.    No results  found for any visits on 07/12/21.      Assessment & Plan:   [name] was seen today for cellulitis.  Diagnoses and all orders for this visit:  Venous stasis ulcer of leg without varicose veins (HCC) Bilateral lower leg cellulitis Bilateral unna boots applied today. Continue doxy as prescribed. Patient is also on lasix. Follow up in 1 week for unna boot removal and recheck.   Essential hypertension Uncontrolled. Unsure of compliance with medication. Asymptotic today. Keep BP log and will recheck next week.   The patient indicates understanding of these issues and agrees with the plan.   Gwenlyn Perking, FNP

## 2021-07-14 ENCOUNTER — Telehealth: Payer: Self-pay | Admitting: Family Medicine

## 2021-07-14 NOTE — Telephone Encounter (Signed)
Left message for daughter that per provider Marjorie Smolder) pt does not need to be seen on Monday, so that appt has been cancelled. Pt needs to keep his appt on Thursday 6/15 for follow up.

## 2021-07-17 ENCOUNTER — Ambulatory Visit: Payer: Medicare Other

## 2021-07-19 ENCOUNTER — Other Ambulatory Visit: Payer: Self-pay | Admitting: Family Medicine

## 2021-07-19 DIAGNOSIS — E782 Mixed hyperlipidemia: Secondary | ICD-10-CM

## 2021-07-19 DIAGNOSIS — I10 Essential (primary) hypertension: Secondary | ICD-10-CM

## 2021-07-20 ENCOUNTER — Encounter: Payer: Self-pay | Admitting: Family Medicine

## 2021-07-20 ENCOUNTER — Ambulatory Visit: Payer: Medicare Other | Admitting: Family Medicine

## 2021-07-20 ENCOUNTER — Ambulatory Visit (INDEPENDENT_AMBULATORY_CARE_PROVIDER_SITE_OTHER): Payer: Medicare Other | Admitting: Family Medicine

## 2021-07-20 VITALS — BP 168/81 | HR 58 | Temp 97.4°F | Ht 73.0 in | Wt 251.4 lb

## 2021-07-20 DIAGNOSIS — L97919 Non-pressure chronic ulcer of unspecified part of right lower leg with unspecified severity: Secondary | ICD-10-CM

## 2021-07-20 DIAGNOSIS — I10 Essential (primary) hypertension: Secondary | ICD-10-CM

## 2021-07-20 DIAGNOSIS — I872 Venous insufficiency (chronic) (peripheral): Secondary | ICD-10-CM

## 2021-07-20 MED ORDER — AMLODIPINE BESYLATE 10 MG PO TABS: 10.0000 mg | ORAL_TABLET | Freq: Every day | ORAL | 1 refills | Status: AC

## 2021-07-20 NOTE — Progress Notes (Signed)
Assessment & Plan:  1-2. Venous stasis ulcer of leg without varicose veins (HCC)/Chronic venous insufficiency Unna boots reapplied today. Referring to lymphedema clinic for assistance as patient returns with new ulcers within weeks of stopping unna boots despite wearing compression hose.  - Ambulatory referral to Occupational Therapy  3. Essential hypertension Uncontrolled. Amlodipine increased from 5 mg to 10 mg daily.  - amLODipine (NORVASC) 10 MG tablet; Take 1 tablet (10 mg total) by mouth daily.  Dispense: 90 tablet; Refill: 1  Daughter Ashby Dawes called and made aware of what was discussed today during the visit.  Follow up plan: Return in about 1 week (around 07/27/2021) for Legs & BP (30 minute appt).  Hendricks Limes, MSN, APRN, FNP-C [facility]  Subjective:   Patient ID: [name], male    DOB: 20-Jul-1929, 88 y.o.   MRN: [id number]  HPI: [name] is a 88 y.o. male presenting on 07/20/2021 for venous stasis ulcer of legs (5 day follow up)  Patient is being seen for a follow-up of bilateral leg ulcers. One week ago unna boots were applied. His daughter called two days later and said he legs were better. He comes in today with only a gauze wrap around his lower calves.    ROS: Negative unless specifically indicated above in HPI.   Relevant past medical history reviewed and updated as indicated.   Allergies and medications reviewed and updated.   Current Outpatient Medications:    amLODipine (NORVASC) 5 MG tablet, Take 1 tablet (5 mg total) by mouth daily., Disp: 30 tablet, Rfl: 2   apixaban (ELIQUIS) 5 MG TABS tablet, Take 1 tablet (5 mg total) by mouth 2 (two) times daily., Disp: 180 tablet, Rfl: 1   fish oil-omega-3 fatty acids 1000 MG capsule, Take 2 g by mouth daily., Disp: , Rfl:    furosemide (LASIX) 20 MG tablet, TAKE ONE TABLET ONCE DAILY FOR FLUID, Disp: 30 tablet, Rfl: 0   lisinopril (ZESTRIL) 40 MG tablet, TAKE ONE TABLET ONCE  DAILY, Disp: 30 tablet, Rfl: 0   omeprazole (PRILOSEC) 20 MG capsule, Take 1 capsule (20 mg total) by mouth daily., Disp: 90 capsule, Rfl: 1   polyethylene glycol (MIRALAX / GLYCOLAX) 17 g packet, Take 17 g by mouth daily as needed for mild constipation., Disp: 14 each, Rfl: 0   rosuvastatin (CRESTOR) 5 MG tablet, TAKE 1/2 TABLET ON MONDAY, WEDNESDAY AND FRIDAY AT 6PM, Disp: 6 tablet, Rfl: 0   triamcinolone ointment (KENALOG) 0.1 %, Apply 1 application topically 2 (two) times daily., Disp: 454 g, Rfl: 0   doxycycline (VIBRA-TABS) 100 MG tablet, Take 1 tablet (100 mg total) by mouth 2 (two) times daily., Disp: 14 tablet, Rfl: 0  No Known Allergies  Objective:   BP (!) 168/81   Pulse (!) 58   Temp (!) 97.4 F (36.3 C) (Temporal)   Ht '6\' 1"'$  (1.854 m)   Wt 251 lb 6.4 oz (114 kg)   SpO2 96%   BMI 33.17 kg/m    Physical Exam Vitals and nursing note reviewed.  Constitutional:      General: He is not in acute distress.    Appearance: He is not ill-appearing, toxic-appearing or diaphoretic.  Cardiovascular:     Rate and Rhythm: Normal rate and regular rhythm.     Heart sounds: Normal heart sounds. No murmur heard. Pulmonary:     Effort: Pulmonary effort is normal. No respiratory distress.     Breath sounds: Normal breath sounds.  Musculoskeletal:     Right lower leg: 2+ Edema present.     Left lower leg: 2+ Edema present.  Skin:    General: Skin is warm and dry.     Findings: Lesion (Superficial healing ulcer to anterior LLE. No drainage.) present.  Neurological:     Mental Status: He is alert and oriented to person, place, and time. Mental status is at baseline.

## 2021-07-21 ENCOUNTER — Telehealth: Payer: Self-pay | Admitting: Family Medicine

## 2021-07-21 NOTE — Telephone Encounter (Signed)
I did not speak to anybody yesterday. What does she need?

## 2021-07-21 NOTE — Telephone Encounter (Signed)
Otila Kluver had questions about referral and patients una boots.  Questions answered

## 2021-07-24 IMAGING — DX DG PORTABLE PELVIS
1 series · 1 of 1 positions shown · non-contrast
Comparison: Intraoperative imaging earlier today

CLINICAL DATA: Postop total right hip replacement

EXAM:
PORTABLE PELVIS 1-2 VIEWS

[pelvis ap]
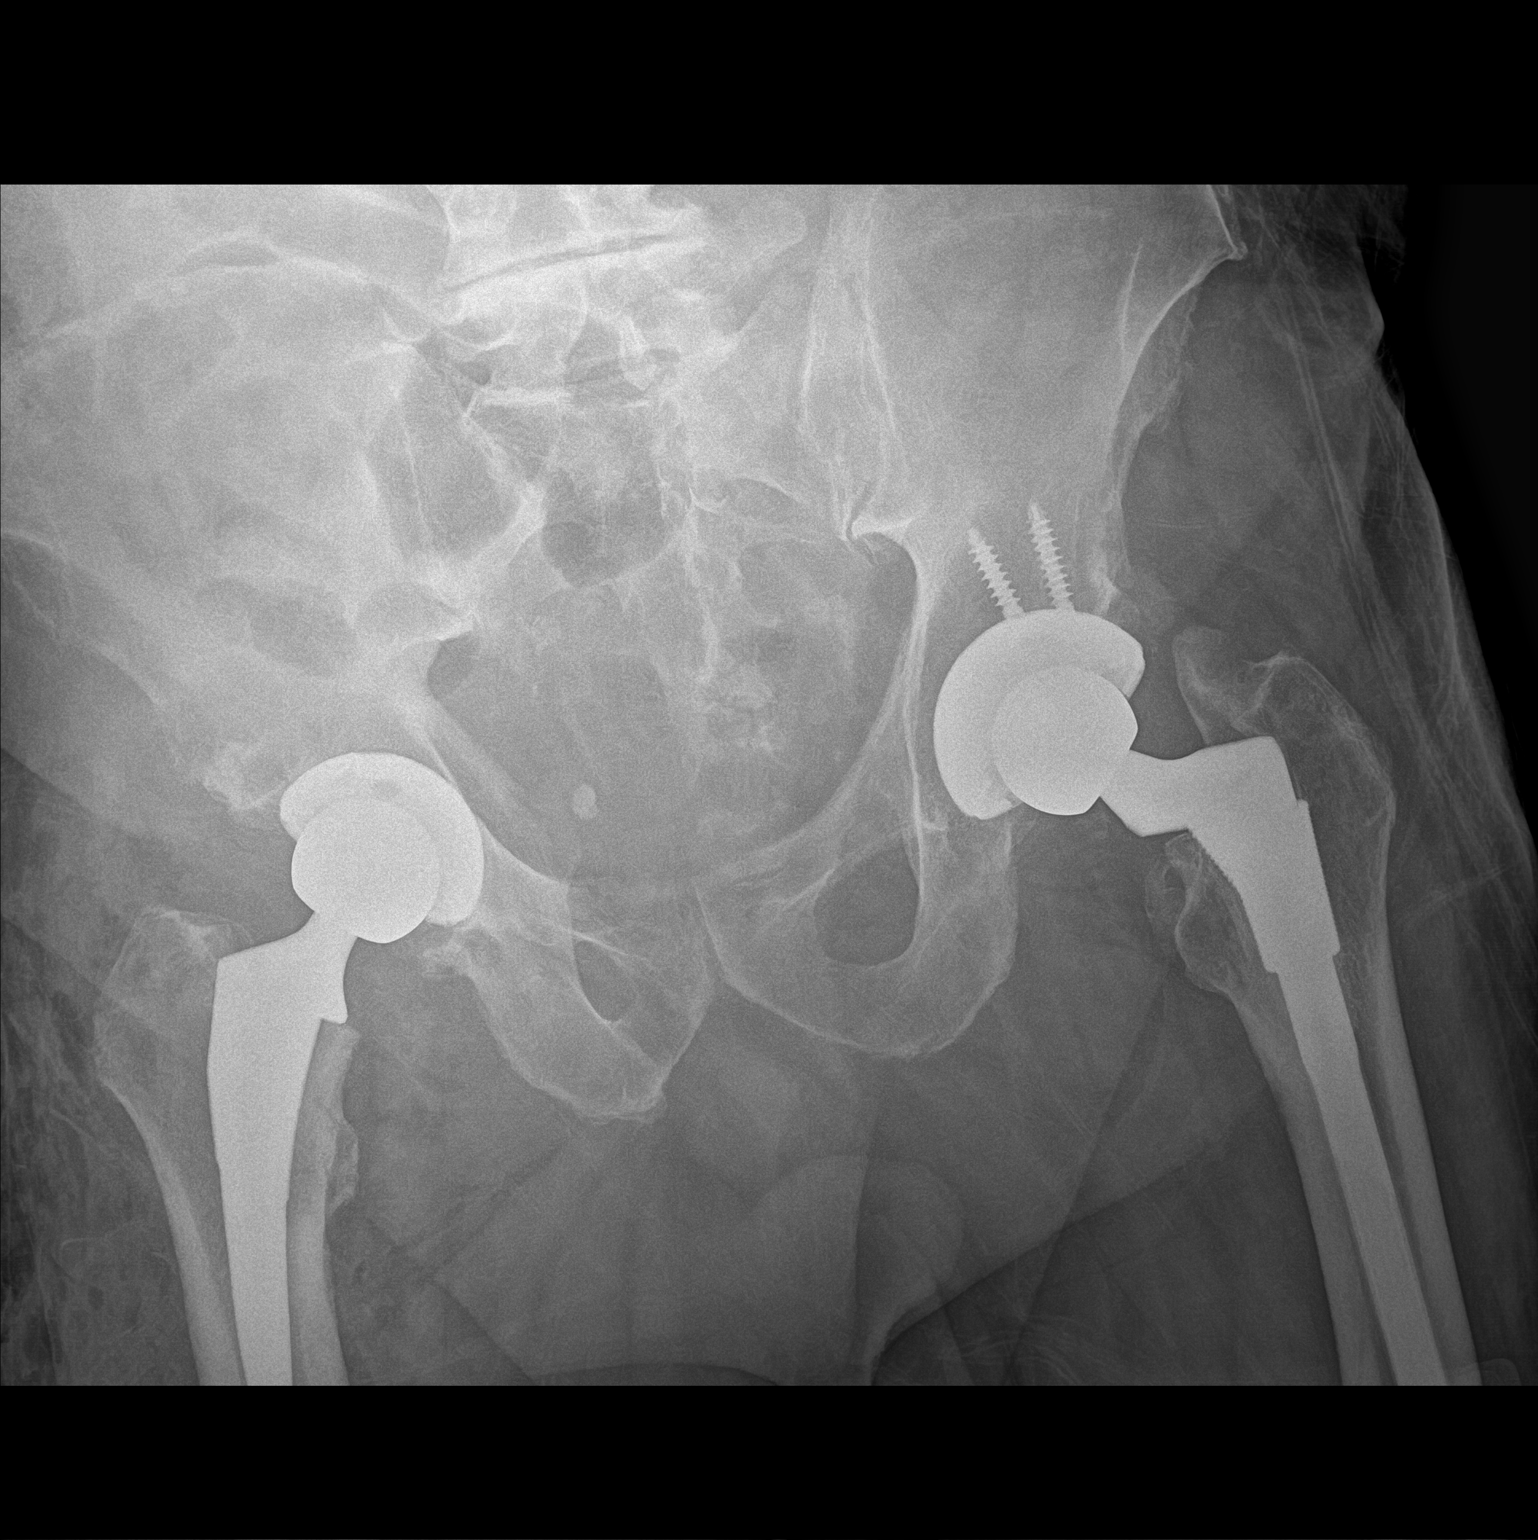

[1 of 1 positions shown; findings below may reference images not displayed]

FINDINGS: Remote changes of left hip replacement and new changes of right hip
replacement. Normal AP alignment. No hardware bony complicating
feature.
IMPRESSION: Right hip replacement.  No visible complicating feature.

## 2021-07-27 ENCOUNTER — Ambulatory Visit (INDEPENDENT_AMBULATORY_CARE_PROVIDER_SITE_OTHER): Payer: Medicare Other | Admitting: Family Medicine

## 2021-07-27 ENCOUNTER — Encounter: Payer: Self-pay | Admitting: Family Medicine

## 2021-07-27 VITALS — BP 190/93 | HR 69 | Temp 97.1°F | Ht 73.0 in | Wt 251.6 lb

## 2021-07-27 DIAGNOSIS — I872 Venous insufficiency (chronic) (peripheral): Secondary | ICD-10-CM | POA: Diagnosis not present

## 2021-07-27 DIAGNOSIS — L97909 Non-pressure chronic ulcer of unspecified part of unspecified lower leg with unspecified severity: Secondary | ICD-10-CM

## 2021-07-27 DIAGNOSIS — I10 Essential (primary) hypertension: Secondary | ICD-10-CM | POA: Diagnosis not present

## 2021-07-27 NOTE — Patient Instructions (Signed)
Go straight to [facility] for them to fit you for compression hose. They are expecting you.   Please make sure you are taking all of your medications as your blood pressure is TOO HIGH.

## 2021-07-27 NOTE — Progress Notes (Unsigned)
   Assessment & Plan:  ***  Follow up plan: No follow-ups on file.  Hendricks Limes, MSN, APRN, FNP-C [facility]  Subjective:   Patient ID: [name], male    DOB: 10/22/1929, 88 y.o.   MRN: [id number]  HPI: [name] is a 88 y.o. male presenting on 07/27/2021 for venous stasis ulcer of leg (1 week re check )   ***   ROS: Negative unless specifically indicated above in HPI.   Relevant past medical history reviewed and updated as indicated.   Allergies and medications reviewed and updated.   Current Outpatient Medications:    amLODipine (NORVASC) 10 MG tablet, Take 1 tablet (10 mg total) by mouth daily., Disp: 90 tablet, Rfl: 1   apixaban (ELIQUIS) 5 MG TABS tablet, Take 1 tablet (5 mg total) by mouth 2 (two) times daily., Disp: 180 tablet, Rfl: 1   fish oil-omega-3 fatty acids 1000 MG capsule, Take 2 g by mouth daily., Disp: , Rfl:    furosemide (LASIX) 20 MG tablet, TAKE ONE TABLET ONCE DAILY FOR FLUID, Disp: 30 tablet, Rfl: 0   lisinopril (ZESTRIL) 40 MG tablet, TAKE ONE TABLET ONCE DAILY, Disp: 30 tablet, Rfl: 0   omeprazole (PRILOSEC) 20 MG capsule, Take 1 capsule (20 mg total) by mouth daily., Disp: 90 capsule, Rfl: 1   polyethylene glycol (MIRALAX / GLYCOLAX) 17 g packet, Take 17 g by mouth daily as needed for mild constipation., Disp: 14 each, Rfl: 0   rosuvastatin (CRESTOR) 5 MG tablet, TAKE 1/2 TABLET ON MONDAY, WEDNESDAY AND FRIDAY AT 6PM, Disp: 6 tablet, Rfl: 0   triamcinolone ointment (KENALOG) 0.1 %, Apply 1 application topically 2 (two) times daily., Disp: 454 g, Rfl: 0  No Known Allergies  Objective:   BP (!) 180/90   Pulse 69   Temp (!) 97.1 F (36.2 C) (Temporal)   Ht '6\' 1"'$  (1.854 m)   Wt 251 lb 9.6 oz (114.1 kg)   SpO2 92%   BMI 33.19 kg/m    Physical Exam

## 2021-07-31 DIAGNOSIS — I872 Venous insufficiency (chronic) (peripheral): Secondary | ICD-10-CM | POA: Insufficient documentation

## 2021-08-02 ENCOUNTER — Telehealth: Payer: Self-pay | Admitting: Family Medicine

## 2021-08-02 ENCOUNTER — Ambulatory Visit (INDEPENDENT_AMBULATORY_CARE_PROVIDER_SITE_OTHER): Payer: Medicare Other | Admitting: Family Medicine

## 2021-08-02 ENCOUNTER — Encounter: Payer: Self-pay | Admitting: Family Medicine

## 2021-08-02 VITALS — BP 148/84 | HR 55 | Temp 97.6°F | Ht 73.0 in | Wt 247.8 lb

## 2021-08-02 DIAGNOSIS — L97929 Non-pressure chronic ulcer of unspecified part of left lower leg with unspecified severity: Secondary | ICD-10-CM | POA: Diagnosis not present

## 2021-08-02 DIAGNOSIS — L97919 Non-pressure chronic ulcer of unspecified part of right lower leg with unspecified severity: Secondary | ICD-10-CM

## 2021-08-02 DIAGNOSIS — I872 Venous insufficiency (chronic) (peripheral): Secondary | ICD-10-CM

## 2021-08-02 DIAGNOSIS — I10 Essential (primary) hypertension: Secondary | ICD-10-CM | POA: Diagnosis not present

## 2021-08-02 DIAGNOSIS — I878 Other specified disorders of veins: Secondary | ICD-10-CM

## 2021-08-02 MED ORDER — FUROSEMIDE 20 MG PO TABS: 20.0000 mg | ORAL_TABLET | Freq: Every day | ORAL | 1 refills | Status: DC

## 2021-08-02 NOTE — Telephone Encounter (Signed)
Daughter calling back, said she was going in a meeting at 1:30. Please call back around 3

## 2021-08-02 NOTE — Assessment & Plan Note (Signed)
Unna Boots reapplied today bilaterally.

## 2021-08-02 NOTE — Assessment & Plan Note (Signed)
Previously referred to the lymphedema clinic - still waiting on an appointment.

## 2021-08-02 NOTE — Assessment & Plan Note (Signed)
Well-controlled on current regimen. ?

## 2021-08-02 NOTE — Progress Notes (Signed)
Assessment & Plan:   Problem List Items Addressed This Visit       Cardiovascular and Mediastinum   Essential hypertension    Well controlled on current regimen.       Relevant Medications   furosemide (LASIX) 20 MG tablet   Venous stasis of lower extremity    Previously referred to the lymphedema clinic - still waiting on an appointment.      Relevant Medications   furosemide (LASIX) 20 MG tablet   Chronic venous insufficiency - Primary    Previously referred to the lymphedema clinic - still waiting on an appointment.      Relevant Medications   furosemide (LASIX) 20 MG tablet     Musculoskeletal and Integument   Venous stasis ulcer of leg without varicose veins (HCC)    Unna Boots reapplied today bilaterally.       Follow up plan: Return in about 1 week (around 08/09/2021) for Unna boots.  Hendricks Limes, MSN, APRN, FNP-C [facility]  Subjective:   Patient ID: [name], male    DOB: 02/22/1929, 88 y.o.   MRN: [id number]  HPI: [name] is a 88 y.o. male presenting on 08/02/2021 for Leg Swelling (Patient states that the pharmacy said he needs more lasix. He states that the swelling is down in the morning but starts to swell when he walks. Left leg)  Patient was scheduled to see me today after coming for a nurse visit due to open wounds on his legs again. Unna boots were removed last week when all wounds were healed and he was advised to resume wearing compression hose. He did not have them on when he came earlier this week, but does have them on today. He does sleep in the bed and he is not swollen upon waking. He has previously been referred to the lymphedema clinic, but does not yet have an appointment. He is in need of a refill of furosemide.    ROS: Negative unless specifically indicated above in HPI.   Relevant past medical history reviewed and updated as indicated.   Allergies and medications reviewed and updated.   Current  Outpatient Medications:    amLODipine (NORVASC) 10 MG tablet, Take 1 tablet (10 mg total) by mouth daily., Disp: 90 tablet, Rfl: 1   apixaban (ELIQUIS) 5 MG TABS tablet, Take 1 tablet (5 mg total) by mouth 2 (two) times daily., Disp: 180 tablet, Rfl: 1   fish oil-omega-3 fatty acids 1000 MG capsule, Take 2 g by mouth daily., Disp: , Rfl:    furosemide (LASIX) 20 MG tablet, TAKE ONE TABLET ONCE DAILY FOR FLUID, Disp: 30 tablet, Rfl: 0   lisinopril (ZESTRIL) 40 MG tablet, TAKE ONE TABLET ONCE DAILY, Disp: 30 tablet, Rfl: 0   omeprazole (PRILOSEC) 20 MG capsule, Take 1 capsule (20 mg total) by mouth daily., Disp: 90 capsule, Rfl: 1   polyethylene glycol (MIRALAX / GLYCOLAX) 17 g packet, Take 17 g by mouth daily as needed for mild constipation., Disp: 14 each, Rfl: 0   rosuvastatin (CRESTOR) 5 MG tablet, TAKE 1/2 TABLET ON MONDAY, WEDNESDAY AND FRIDAY AT 6PM, Disp: 6 tablet, Rfl: 0   triamcinolone ointment (KENALOG) 0.1 %, Apply 1 application topically 2 (two) times daily., Disp: 454 g, Rfl: 0  No Known Allergies  Objective:   BP (!) 179/83   Pulse (!) 55   Temp 97.6 F (36.4 C) (Temporal)   Ht '6\' 1"'$  (1.854 m)   Wt 247 lb  12.8 oz (112.4 kg)   SpO2 95%   BMI 32.69 kg/m    Physical Exam Vitals reviewed.  Constitutional:      General: He is not in acute distress.    Appearance: Normal appearance. He is obese. He is not ill-appearing, toxic-appearing or diaphoretic.  HENT:     Head: Normocephalic and atraumatic.  Eyes:     General: No scleral icterus.       Right eye: No discharge.        Left eye: No discharge.     Conjunctiva/sclera: Conjunctivae normal.  Cardiovascular:     Rate and Rhythm: Normal rate and regular rhythm.     Heart sounds: Normal heart sounds. No murmur heard.    No friction rub. No gallop.  Pulmonary:     Effort: Pulmonary effort is normal. No respiratory distress.     Breath sounds: Normal breath sounds. No stridor. No wheezing, rhonchi or rales.   Musculoskeletal:        General: Normal range of motion.     Cervical back: Normal range of motion.     Right lower leg: No edema.     Left lower leg: Edema (above compression hose) present.  Skin:    General: Skin is warm and dry.     Findings: Wound (blisters to LLE; 2 open dime size wounds to LLE; 1 nickle size wound to RLE. Serous drainage. No surrounding erythema or warmth.) present.  Neurological:     Mental Status: He is alert and oriented to person, place, and time. Mental status is at baseline.  Psychiatric:        Mood and Affect: Mood normal.        Behavior: Behavior normal.        Thought Content: Thought content normal.        Judgment: Judgment normal.

## 2021-08-02 NOTE — Telephone Encounter (Signed)
All questions answered. Discussed I do not want home health helping with wound care as they keep removing the [employer] and not replacing them, causing the patient to develop new sores by the time he returns to see me. We do not know what the pneumatic pumps will cost with insurance, she is going to call and find out. She is requesting we send all office notes regarding his legs to the [facility]. He sees Dr. Leonor Liv whose nurse is Adelle. Fax # [phone]. Please include all our office notes as well as notes from [facility].

## 2021-08-02 NOTE — Telephone Encounter (Signed)
Lmtcb.

## 2021-08-02 NOTE — Telephone Encounter (Signed)
Returned daughters call- she was trying to get a pump ordered from [place] for patient for his leg.  States they will not see him with open sores.  Explained to daughter that we seen patient today and had to re wrap his legs do to the sores coming back. Daughter is upset that patient keeps getting sores and we told HH not to come back. Requesting to speak to [facility].

## 2021-08-03 ENCOUNTER — Ambulatory Visit: Payer: Medicare Other | Admitting: Family Medicine

## 2021-08-03 NOTE — Telephone Encounter (Signed)
Records faxed to [facility].   [phone]

## 2021-08-09 ENCOUNTER — Encounter: Payer: Self-pay | Admitting: Family Medicine

## 2021-08-09 ENCOUNTER — Ambulatory Visit (INDEPENDENT_AMBULATORY_CARE_PROVIDER_SITE_OTHER): Payer: Medicare Other | Admitting: Family Medicine

## 2021-08-09 VITALS — BP 146/70 | HR 50 | Temp 97.6°F | Ht 73.0 in | Wt 244.6 lb

## 2021-08-09 DIAGNOSIS — L97919 Non-pressure chronic ulcer of unspecified part of right lower leg with unspecified severity: Secondary | ICD-10-CM | POA: Diagnosis not present

## 2021-08-09 DIAGNOSIS — L602 Onychogryphosis: Secondary | ICD-10-CM

## 2021-08-09 DIAGNOSIS — I872 Venous insufficiency (chronic) (peripheral): Secondary | ICD-10-CM

## 2021-08-09 DIAGNOSIS — I878 Other specified disorders of veins: Secondary | ICD-10-CM | POA: Diagnosis not present

## 2021-08-09 DIAGNOSIS — L97929 Non-pressure chronic ulcer of unspecified part of left lower leg with unspecified severity: Secondary | ICD-10-CM

## 2021-08-09 NOTE — Progress Notes (Signed)
Assessment & Plan:  1-3. Chronic venous insufficiency/Venous stasis of lower extremity/Venous stasis ulcer of leg without varicose veins (HCC) Improving. Unna boots reapplied today. Patient needs to get established with the lymphedema clinic.  4-5. Thickened nails/Long toenail Patient unable to trim his own nails due to thickness. - Ambulatory referral to Podiatry   Follow up plan: Return in about 1 week (around 08/16/2021) for Unna boots.  Hendricks Limes, MSN, APRN, FNP-C [facility]  Subjective:   Patient ID: [name], male    DOB: 06-27-1929, 88 y.o.   MRN: [id number]  HPI: [name] is a 88 y.o. male presenting on 08/09/2021 for Unna boots  Patient is here to re-check his legs after having unna boots applied a week ago due to multiple open wounds related to chronic venous insufficiency and venous stasis. He has previously been referred to the lymphedema clinic, but does not yet have an appointment. His daughter is trying to see if the [place] can help cover the cost of the pneumatic pumps offered by the lymphedema clinic.    ROS: Negative unless specifically indicated above in HPI.   Relevant past medical history reviewed and updated as indicated.   Allergies and medications reviewed and updated.   Current Outpatient Medications:    amLODipine (NORVASC) 10 MG tablet, Take 1 tablet (10 mg total) by mouth daily., Disp: 90 tablet, Rfl: 1   apixaban (ELIQUIS) 5 MG TABS tablet, Take 1 tablet (5 mg total) by mouth 2 (two) times daily., Disp: 180 tablet, Rfl: 1   fish oil-omega-3 fatty acids 1000 MG capsule, Take 2 g by mouth daily., Disp: , Rfl:    furosemide (LASIX) 20 MG tablet, Take 1 tablet (20 mg total) by mouth daily., Disp: 90 tablet, Rfl: 1   lisinopril (ZESTRIL) 40 MG tablet, TAKE ONE TABLET ONCE DAILY, Disp: 30 tablet, Rfl: 0   omeprazole (PRILOSEC) 20 MG capsule, Take 1 capsule (20 mg total) by mouth daily., Disp: 90 capsule, Rfl: 1    polyethylene glycol (MIRALAX / GLYCOLAX) 17 g packet, Take 17 g by mouth daily as needed for mild constipation., Disp: 14 each, Rfl: 0   rosuvastatin (CRESTOR) 5 MG tablet, TAKE 1/2 TABLET ON MONDAY, WEDNESDAY AND FRIDAY AT 6PM, Disp: 6 tablet, Rfl: 0   triamcinolone ointment (KENALOG) 0.1 %, Apply 1 application topically 2 (two) times daily., Disp: 454 g, Rfl: 0  No Known Allergies  Objective:   Pulse (!) 50   Temp 97.6 F (36.4 C)   Ht '6\' 1"'$  (1.854 m)   Wt 244 lb 9.6 oz (110.9 kg)   SpO2 96%   BMI 32.27 kg/m    Physical Exam Vitals reviewed.  Constitutional:      General: He is not in acute distress.    Appearance: Normal appearance. He is obese. He is not ill-appearing, toxic-appearing or diaphoretic.  HENT:     Head: Normocephalic and atraumatic.  Eyes:     General: No scleral icterus.       Right eye: No discharge.        Left eye: No discharge.     Conjunctiva/sclera: Conjunctivae normal.  Cardiovascular:     Rate and Rhythm: Normal rate and regular rhythm.     Heart sounds: Normal heart sounds. No murmur heard.    No friction rub. No gallop.  Pulmonary:     Effort: Pulmonary effort is normal. No respiratory distress.     Breath sounds: Normal breath sounds. No stridor. No  wheezing, rhonchi or rales.  Musculoskeletal:        General: Normal range of motion.     Cervical back: Normal range of motion.     Right lower leg: No edema.     Left lower leg: No edema.  Feet:     Right foot:     Toenail Condition: Right toenails are abnormally thick and long.     Left foot:     Toenail Condition: Left toenails are abnormally thick and long.  Skin:    General: Skin is warm and dry.     Findings: Wound (2 open wounds to LLE that have decreased in size; 1 eraser size wound to RLE. No surrounding erythema or warmth.) present.  Neurological:     Mental Status: He is alert and oriented to person, place, and time. Mental status is at baseline.  Psychiatric:        Mood and  Affect: Mood normal.        Behavior: Behavior normal.        Thought Content: Thought content normal.        Judgment: Judgment normal.

## 2021-08-15 ENCOUNTER — Other Ambulatory Visit: Payer: Self-pay | Admitting: Family Medicine

## 2021-08-15 DIAGNOSIS — E782 Mixed hyperlipidemia: Secondary | ICD-10-CM

## 2021-08-15 DIAGNOSIS — I10 Essential (primary) hypertension: Secondary | ICD-10-CM

## 2021-08-16 ENCOUNTER — Ambulatory Visit (INDEPENDENT_AMBULATORY_CARE_PROVIDER_SITE_OTHER): Payer: Medicare Other | Admitting: Family Medicine

## 2021-08-16 ENCOUNTER — Encounter: Payer: Self-pay | Admitting: Family Medicine

## 2021-08-16 VITALS — BP 160/85 | HR 69 | Temp 97.5°F | Ht 73.0 in | Wt 243.8 lb

## 2021-08-16 DIAGNOSIS — I872 Venous insufficiency (chronic) (peripheral): Secondary | ICD-10-CM | POA: Diagnosis not present

## 2021-08-16 DIAGNOSIS — I878 Other specified disorders of veins: Secondary | ICD-10-CM

## 2021-08-16 NOTE — Progress Notes (Signed)
Assessment & Plan:  1-2. Chronic venous insufficiency/Venous stasis of lower extremity Wounds have healed and therefore Unna boots were left off today. Compression hose applied in the office today. Encouraged patient to apply them first thing in the morning and remove at bedtime. Per his son (who I spoke with on the phone), he is doing better about elevating his legs during the day. His daughter is still working on getting him an appointment with either the lymphedema clinic or the [place].   Follow up plan: Return in about 4 weeks (around 09/13/2021) for follow-up of chronic medication conditions.  Hendricks Limes, MSN, APRN, FNP-C [facility]  Subjective:   Patient ID: [name], male    DOB: Oct 06, 1929, 88 y.o.   MRN: [id number]  HPI: [name] is a 88 y.o. male presenting on 08/16/2021 for una boots (1 week re check)  Patient is here to re-check his legs after having unna boots reapplied a week ago due to multiple open wounds related to chronic venous insufficiency and venous stasis. He has previously been referred to the lymphedema clinic, but does not yet have an appointment. His daughter is trying to see if the [place] can help cover the cost of the pneumatic pumps offered by the lymphedema clinic.    ROS: Negative unless specifically indicated above in HPI.   Relevant past medical history reviewed and updated as indicated.   Allergies and medications reviewed and updated.   Current Outpatient Medications:    amLODipine (NORVASC) 10 MG tablet, Take 1 tablet (10 mg total) by mouth daily., Disp: 90 tablet, Rfl: 1   apixaban (ELIQUIS) 5 MG TABS tablet, Take 1 tablet (5 mg total) by mouth 2 (two) times daily., Disp: 180 tablet, Rfl: 1   fish oil-omega-3 fatty acids 1000 MG capsule, Take 2 g by mouth daily., Disp: , Rfl:    furosemide (LASIX) 20 MG tablet, Take 1 tablet (20 mg total) by mouth daily., Disp: 90 tablet, Rfl: 1   lisinopril (ZESTRIL) 40 MG tablet,  TAKE ONE TABLET ONCE DAILY, Disp: 30 tablet, Rfl: 5   omeprazole (PRILOSEC) 20 MG capsule, Take 1 capsule (20 mg total) by mouth daily., Disp: 90 capsule, Rfl: 1   polyethylene glycol (MIRALAX / GLYCOLAX) 17 g packet, Take 17 g by mouth daily as needed for mild constipation., Disp: 14 each, Rfl: 0   rosuvastatin (CRESTOR) 5 MG tablet, TAKE 1/2 TABLET ON MONDAY, WEDNESDAY AND FRIDAY AT 6PM, Disp: 6 tablet, Rfl: 5   triamcinolone ointment (KENALOG) 0.1 %, Apply 1 application topically 2 (two) times daily., Disp: 454 g, Rfl: 0  No Known Allergies  Objective:   BP (!) 160/85   Pulse 69   Temp (!) 97.5 F (36.4 C) (Temporal)   Ht '6\' 1"'$  (1.854 m)   Wt 243 lb 12.8 oz (110.6 kg)   SpO2 96%   BMI 32.17 kg/m    Physical Exam Vitals reviewed.  Constitutional:      General: He is not in acute distress.    Appearance: Normal appearance. He is obese. He is not ill-appearing, toxic-appearing or diaphoretic.  HENT:     Head: Normocephalic and atraumatic.  Eyes:     General: No scleral icterus.       Right eye: No discharge.        Left eye: No discharge.     Conjunctiva/sclera: Conjunctivae normal.  Cardiovascular:     Rate and Rhythm: Normal rate and regular rhythm.  Heart sounds: Normal heart sounds. No murmur heard.    No friction rub. No gallop.  Pulmonary:     Effort: Pulmonary effort is normal. No respiratory distress.     Breath sounds: Normal breath sounds. No stridor. No wheezing, rhonchi or rales.  Musculoskeletal:        General: Normal range of motion.     Cervical back: Normal range of motion.     Right lower leg: No edema.     Left lower leg: No edema.  Feet:     Right foot:     Toenail Condition: Right toenails are abnormally thick and long.     Left foot:     Toenail Condition: Left toenails are abnormally thick and long.  Skin:    General: Skin is warm and dry.     Findings: No wound.  Neurological:     Mental Status: He is alert and oriented to person, place,  and time. Mental status is at baseline.  Psychiatric:        Mood and Affect: Mood normal.        Behavior: Behavior normal.        Thought Content: Thought content normal.        Judgment: Judgment normal.

## 2021-08-17 ENCOUNTER — Encounter: Payer: Self-pay | Admitting: Family Medicine

## 2021-08-24 DIAGNOSIS — L570 Actinic keratosis: Secondary | ICD-10-CM | POA: Diagnosis not present

## 2021-08-25 ENCOUNTER — Encounter: Payer: Self-pay | Admitting: Nurse Practitioner

## 2021-08-25 ENCOUNTER — Ambulatory Visit (INDEPENDENT_AMBULATORY_CARE_PROVIDER_SITE_OTHER): Payer: Medicare Other | Admitting: Nurse Practitioner

## 2021-08-25 VITALS — BP 159/90 | HR 65 | Temp 97.2°F | Ht 73.0 in | Wt 245.4 lb

## 2021-08-25 DIAGNOSIS — L989 Disorder of the skin and subcutaneous tissue, unspecified: Secondary | ICD-10-CM

## 2021-08-25 MED ORDER — HYDROCORTISONE 1 % EX LOTN: 1.0000 | TOPICAL_LOTION | Freq: Two times a day (BID) | CUTANEOUS | 0 refills | Status: AC

## 2021-08-25 NOTE — Progress Notes (Signed)
Acute Office Visit  Subjective:     Patient ID: [name], male    DOB: Nov 08, 1929, 88 y.o.   MRN: [id number]  Chief Complaint  Patient presents with   scab    Under right eye that patient thinks he has a piece of band-aid in     Rash This is a recurrent problem. The current episode started in the past 7 days. The problem has been gradually improving since onset. The affected locations include the face. The rash is characterized by blistering and redness. He was exposed to nothing. Pertinent negatives include no eye pain, facial edema or fever. Past treatments include nothing.     Review of Systems  Constitutional:  Negative for fever.  HENT: Negative.    Eyes: Negative.  Negative for pain.  Respiratory: Negative.    Cardiovascular: Negative.   Genitourinary: Negative.   Skin:  Positive for rash.  Neurological: Negative.   Endo/Heme/Allergies: Negative.   All other systems reviewed and are negative.       Objective:    BP (!) 159/90   Pulse 65   Temp (!) 97.2 F (36.2 C) (Temporal)   Ht '6\' 1"'$  (1.854 m)   Wt 245 lb 6.4 oz (111.3 kg)   SpO2 96%   BMI 32.38 kg/m  BP Readings from Last 3 Encounters:  08/25/21 (!) 159/90  08/16/21 (!) 160/85  08/09/21 (!) 146/70   Wt Readings from Last 3 Encounters:  08/25/21 245 lb 6.4 oz (111.3 kg)  08/16/21 243 lb 12.8 oz (110.6 kg)  08/09/21 244 lb 9.6 oz (110.9 kg)      Physical Exam Vitals and nursing note reviewed.  Constitutional:      Appearance: Normal appearance.  HENT:     Head: Normocephalic.      Comments: Lesion left under eye upper cheekbone area    Nose: Nose normal.  Eyes:     Conjunctiva/sclera: Conjunctivae normal.  Cardiovascular:     Rate and Rhythm: Normal rate and regular rhythm.     Pulses: Normal pulses.     Heart sounds: Normal heart sounds.  Abdominal:     General: Bowel sounds are normal.  Skin:    General: Skin is warm.     Findings: Erythema and lesion present.  Neurological:      Mental Status: He is alert and oriented to person, place, and time.     No results found for any visits on 08/25/21.      Assessment & Plan:  Patient presents with lesion on the left upper cheekbone area.  Symptoms have been present in the past few days.  Patient was assessed by dermatologist who told him that everything looked okay and to return if symptoms got worse.  Patient is in clinic today because he thinks he left a piece of gauze stuck in the skin.  After assessment no gauze presents.  Lesion is gradually improving, no pain or itching reported by patient.  No fever chills nausea or vomiting.  No signs and symptoms of infection present.  Provided education to patient printed handouts given.  Advised patient to return to dermatology if symptoms get worse.  Ordered hydrocortisone 1% topical cream as needed.  Tylenol as needed if needed.  Patient verbalized understanding and knows to follow-up with worsening or unresolved symptoms.  All Problem List Items Addressed This Visit   None Visit Diagnoses     Lesion of skin of face    -  Primary   Relevant  Medications   hydrocortisone 1 % lotion       Meds ordered this encounter  Medications   hydrocortisone 1 % lotion    Sig: Apply 1 Application topically 2 (two) times daily.    Dispense:  118 mL    Refill:  0    Order Specific Question:   Supervising Provider    Answer:   Claretta Fraise [[id number]]    Return if symptoms worsen or fail to improve.  Ivy Lynn, NP

## 2021-08-25 NOTE — Patient Instructions (Signed)
Excision of Skin Lesions Excision of a skin lesion is the removal of a section of skin by making small incisions in the skin. Through this process, the lesion is completely removed. This procedure is often done to treat or prevent cancer or infection. It may also be done to improve cosmetic appearance. You may have this procedure to remove: Cancerous (malignant) growths, such as basal cell carcinoma, squamous cell carcinoma, or melanoma. Noncancerous (benign) growths, such as a cyst or lipoma. Growths, such as moles or skin tags, which may be removed for cosmetic reasons. Various excision or surgical techniques may be used depending on your condition, the location of the lesion, and your overall health. Tell your health care provider about: Any allergies you have. All medicines you are taking, including vitamins, herbs, eye drops, creams, and over-the-counter medicines. Any problems you or family members have had with anesthetic medicines. Any bleeding problems you have. Any surgeries you have had. Any medical conditions you have. Whether you are pregnant or may be pregnant. What are the risks? Generally, this is a safe procedure. However, problems may occur, including: Bleeding. Infection. Scarring. Recurrence of the cyst, lipoma, or cancer. Allergic reaction to anesthetics, surgical materials, or ointments. Damage to nerves, blood vessels, muscles, or other structures. What happens before the procedure? Medicines Ask your health care provider about: Changing or stopping your regular medicines. This is especially important if you are taking diabetes medicines or blood thinners. Taking medicines such as aspirin and ibuprofen. These medicines can thin your blood. Do not take these medicines unless your health care provider tells you to take them. Taking over-the-counter medicines, vitamins, herbs, and supplements. General instructions Do not use any products that contain nicotine or  tobacco. These products include cigarettes, chewing tobacco, and vaping devices, such as e-cigarettes. If you need help quitting, ask your health care provider. Follow instructions from your health care provider about eating or drinking restrictions. Ask your health care provider: How your surgery site will be marked. What steps will be taken to help prevent infection. These steps may include: Removing hair at the surgery site. Washing skin with a germ-killing soap. Taking antibiotic medicine. Ask your health care provider if you will need someone to take you home from the hospital or clinic after the procedure. What happens during the procedure?  You will be given a medicine to numb the area (local anesthetic). Your health care provider will remove the lesions using one of the following excision techniques. Complete surgical excision. This procedure may be done to treat a cancerous growth or a noncancerous cyst or lesion. A small scalpel or scissors will be used to gently cut around and under the lesion until it is completely removed. If bleeding occurs, it will be stopped with a device that delivers heat (electrocautery). The edges of the wound may be stitched (sutured) together. A bandage (dressing) will be applied. Samples will be sent to a lab for testing. Excision of a cyst. An incision will be made on the cyst. The entire cyst will be removed through the incision. The incision may be closed with sutures. Shave excision. This may be done to remove a mole or other small growths. A small blade or scalpel will be used to shave off the lesion. The wound is usually left to heal on its own without sutures. The sample may be sent to a lab for testing. Punch excision. This may be done to completely remove a mole or other small growths. A small tool that  is like a cookie cutter or a hole punch is used to cut a circle shape out of the skin. The outer edges of the skin will be sutured  together. The sample may be sent to a lab for testing. Mohs micrographic surgery. This is usually done to treat skin cancer. This type of excision is mostly used on the face and ears. This procedure is minimally invasive, and it ensures the best cosmetic outcome. A scalpel or a loop instrument will be used to remove layers of the lesion until all the abnormal or cancerous tissue has been removed. The wound may be sutured, depending on its size. The tissue will be checked under a microscope right away. The procedure may vary among health care providers and hospitals. At the end of any of these procedures, antibiotic ointment will be applied as needed. What happens after the procedure? Talk with your health care provider to discuss any test results, treatment options, and if necessary, the need for more tests. Keep all follow-up visits. This is important. Summary Excision of a skin lesion is the removal of a section of skin by making small incisions in the skin. This procedure is often done to treat or prevent skin cancer, remove benign growths, or it may be done to improve cosmetic appearance. Various excision or surgical techniques may be used depending on your condition, the location of the lesion, and your overall health. After the procedure, talk with your health care provider to discuss any test results, treatment options, and if necessary, the need for more tests. Keep all follow-up visits. This is important. This information is not intended to replace advice given to you by your health care provider. Make sure you discuss any questions you have with your health care provider. Document Revised: 08/23/2020 Document Reviewed: 08/23/2020 Elsevier Patient Education  [place].

## 2021-09-04 ENCOUNTER — Ambulatory Visit: Payer: Self-pay | Admitting: *Deleted

## 2021-09-04 DIAGNOSIS — I10 Essential (primary) hypertension: Secondary | ICD-10-CM

## 2021-09-04 NOTE — Patient Instructions (Signed)
  Chronic Care Management   [name]  I have previously worked with you through the Chronic Care Management Program at [facility]. Due to program changes I am removing myself from your care team because we haven't engaged within the past 6 months.  If you feel that you need services in the future,  please talk with your primary care provider and request a new referral for Care Coordination. This does not affect your status as a patient at [facility].   Thank you for allowing me to participate in your your healthcare journey.  Chong Sicilian, BSN, RN-BC Embedded Chronic Care Manager [facility] / [facility] Direct Dial: [phone]

## 2021-09-04 NOTE — Chronic Care Management (AMB) (Signed)
  Chronic Care Management   Note  09/04/2021 Name: [name] MRN: [id number] DOB: May 04, 1929   Patient has either met RN Care Management goals, is stable from [facility] Management perspective, or has not recently engaged with the RN Care Manager. I am removing RN Care Manager from Care Team and closing [facility]. Patient does not have an open Care Plan with another CCM team member. Patient does not have a current CCM referral placed since 06/05/21. CCM enrollment status was not accurate and was changed to "not enrolled".  Their PCP can place a new referral if the patient needs Care Management or Care Coordination services in the future.  Chong Sicilian, BSN, RN-BC Embedded Chronic Care Manager [facility] / [facility] Direct Dial: [phone]

## 2021-09-12 ENCOUNTER — Encounter: Payer: Self-pay | Admitting: Family Medicine

## 2021-09-12 ENCOUNTER — Ambulatory Visit (INDEPENDENT_AMBULATORY_CARE_PROVIDER_SITE_OTHER): Payer: Medicare Other | Admitting: Family Medicine

## 2021-09-12 VITALS — BP 137/74 | HR 54 | Temp 96.8°F | Resp 20 | Ht 73.0 in | Wt 243.0 lb

## 2021-09-12 DIAGNOSIS — I10 Essential (primary) hypertension: Secondary | ICD-10-CM | POA: Diagnosis not present

## 2021-09-12 DIAGNOSIS — E559 Vitamin D deficiency, unspecified: Secondary | ICD-10-CM | POA: Diagnosis not present

## 2021-09-12 DIAGNOSIS — I4821 Permanent atrial fibrillation: Secondary | ICD-10-CM

## 2021-09-12 DIAGNOSIS — E782 Mixed hyperlipidemia: Secondary | ICD-10-CM

## 2021-09-12 DIAGNOSIS — I872 Venous insufficiency (chronic) (peripheral): Secondary | ICD-10-CM | POA: Diagnosis not present

## 2021-09-12 NOTE — Progress Notes (Signed)
Assessment & Plan:  1. Essential hypertension Well controlled on current regimen.  - CBC with Differential/Platelet - CMP14+EGFR - Lipid panel  2. Mixed hyperlipidemia Labs to assess. - CBC with Differential/Platelet - CMP14+EGFR - Lipid panel  3. Permanent atrial fibrillation (HCC) Well controlled on current regimen.  - CBC with Differential/Platelet - CMP14+EGFR  4. Chronic venous insufficiency Continue current plan of obtaining appointment at the [facility] clinic.  Continue elevating feet throughout the day is much as possible.  Continue wearing compression hose daily. - CMP14+EGFR  5. Vitamin D deficiency - VITAMIN D 25 Hydroxy (Vit-D Deficiency, Fractures)   Return in about 3 months (around 12/13/2021) for follow-up of chronic medication conditions with Lilia Pro.  Hendricks Limes, MSN, APRN, FNP-C [facility]  Subjective:    Patient ID: [name], male    DOB: 1929/04/26, 88 y.o.   MRN: [id number]  Patient Care Team: [name], FNP as PCP - General (Family Medicine) [name], MD as Consulting Physician (Orthopedic Surgery) Sandford Craze, MD as Referring Physician (Dermatology) Lorretta Harp, MD as Consulting Physician (Cardiology)   Chief Complaint:  Chief Complaint  Patient presents with   Edema    Lower legs / sores    HPI: [name] is a 88 y.o. male presenting on 09/12/2021 for Edema (Lower legs / sores)  A-Fib: diagnosed in 2022 during a routine preop exam. Taking Eliquis daily.   Hypertension: Taking amlodipine, furosemide, and lisinopril daily.  Hyperlipidemia: taking rosuvastatin daily.  Venous insufficiency: His swelling is down in the morning and gets worse throughout the day. He does wear compression hose daily. He saw Dr. Trula Slade, vascular, once a couple of years ago, but did not go back for follow-up for unknown reasons. He has had to wear unna boots frequently over the past several months due to  wounds on his lower extremities.  He does not currently have any weakness.  His daughter is trying to get him an appointment at the [place] [facility] so hopefully they can get him some pneumatic pumps to use at home.  New complaints: None   Social history:  Relevant past medical, surgical, family and social history reviewed and updated as indicated. Interim medical history since our last visit reviewed.  Allergies and medications reviewed and updated.  DATA REVIEWED: CHART IN EPIC  ROS: Negative unless specifically indicated above in HPI.    Current Outpatient Medications:    amLODipine (NORVASC) 10 MG tablet, Take 1 tablet (10 mg total) by mouth daily., Disp: 90 tablet, Rfl: 1   apixaban (ELIQUIS) 5 MG TABS tablet, Take 1 tablet (5 mg total) by mouth 2 (two) times daily., Disp: 180 tablet, Rfl: 1   fish oil-omega-3 fatty acids 1000 MG capsule, Take 2 g by mouth daily., Disp: , Rfl:    furosemide (LASIX) 20 MG tablet, Take 1 tablet (20 mg total) by mouth daily., Disp: 90 tablet, Rfl: 1   hydrocortisone 1 % lotion, Apply 1 Application topically 2 (two) times daily., Disp: 118 mL, Rfl: 0   lisinopril (ZESTRIL) 40 MG tablet, TAKE ONE TABLET ONCE DAILY, Disp: 30 tablet, Rfl: 5   omeprazole (PRILOSEC) 20 MG capsule, Take 1 capsule (20 mg total) by mouth daily., Disp: 90 capsule, Rfl: 1   polyethylene glycol (MIRALAX / GLYCOLAX) 17 g packet, Take 17 g by mouth daily as needed for mild constipation., Disp: 14 each, Rfl: 0   rosuvastatin (CRESTOR) 5 MG tablet, TAKE 1/2 TABLET ON MONDAY, WEDNESDAY AND FRIDAY AT 6PM,  Disp: 6 tablet, Rfl: 5   triamcinolone ointment (KENALOG) 0.1 %, Apply 1 application topically 2 (two) times daily., Disp: 454 g, Rfl: 0   No Known Allergies Past Medical History:  Diagnosis Date   Arthritis    Atrial fibrillation ([place]) 04/28/2020   Hyperlipidemia    Hypertension    Skin cancer    Face    Past Surgical History:  Procedure Laterality Date   APPENDECTOMY     JOINT  REPLACEMENT Left    hip    SKIN LESION EXCISION     Face   TOTAL HIP ARTHROPLASTY Right 05/11/2020   Procedure: TOTAL HIP ARTHROPLASTY ANTERIOR APPROACH;  Surgeon: [name], MD;  Location: [facility];  Service: Orthopedics;  Laterality: Right;  173mn    Social History   Socioeconomic History   Marital status: Divorced    Spouse name: Not on file   Number of children: 3   Years of education: 11   Highest education level: 11th grade  Occupational History   Not on file  Tobacco Use   Smoking status: Never   Smokeless tobacco: Never  Vaping Use   Vaping Use: Never used  Substance and Sexual Activity   Alcohol use: No   Drug use: No   Sexual activity: Not on file  Other Topics Concern   Not on file  Social History Narrative   Not on file   Social Determinants of Health   Financial Resource Strain: Medium Risk (11/01/2020)   Overall Financial Resource Strain (CARDIA)    Difficulty of Paying Living Expenses: Somewhat hard  Food Insecurity: No Food Insecurity (11/01/2020)   Hunger Vital Sign    Worried About Running Out of Food in the Last Year: Never true    Ran Out of Food in the Last Year: Never true  Transportation Needs: No Transportation Needs (11/01/2020)   PRAPARE - THydrologist(Medical): No    Lack of Transportation (Non-Medical): No  Physical Activity: Sufficiently Active (07/10/2018)   Exercise Vital Sign    Days of Exercise per Week: 5 days    Minutes of Exercise per Session: 60 min  Stress: No Stress Concern Present (07/10/2018)   F[place]   Feeling of Stress : Not at all  Social Connections: Moderately Isolated (07/10/2018)   Social Connection and Isolation Panel [NHANES]    Frequency of Communication with Friends and Family: More than three times a week    Frequency of Social Gatherings with Friends and Family: More than three times a week    Attends Religious  Services: Never    AMarine scientistor Organizations: No    Attends CArchivistMeetings: Never    Marital Status: Divorced  IHuman resources officerViolence: Not At Risk (07/10/2018)   Humiliation, Afraid, Rape, and Kick questionnaire    Fear of Current or Ex-Partner: No    Emotionally Abused: No    Physically Abused: No    Sexually Abused: No        Objective:    BP 137/74   Pulse (!) 54   Temp (!) 96.8 F (36 C)   Resp 20   Ht _0  (1.854 m)   Wt 243 lb (110.2 kg)   SpO2 97%   BMI 32.06 kg/m   Wt Readings from Last 3 Encounters:  09/12/21 243 lb (110.2 kg)  08/25/21 245 lb 6.4 oz (111.3 kg)  08/16/21 243  lb 12.8 oz (110.6 kg)    Physical Exam Vitals reviewed.  Constitutional:      General: He is not in acute distress.    Appearance: Normal appearance. He is obese. He is not ill-appearing, toxic-appearing or diaphoretic.  HENT:     Head: Normocephalic and atraumatic.  Eyes:     General: No scleral icterus.       Right eye: No discharge.        Left eye: No discharge.     Conjunctiva/sclera: Conjunctivae normal.  Cardiovascular:     Rate and Rhythm: Normal rate. Rhythm irregular.     Heart sounds: Normal heart sounds. No murmur heard.    No friction rub. No gallop.  Pulmonary:     Effort: Pulmonary effort is normal. No respiratory distress.     Breath sounds: Normal breath sounds. No stridor. No wheezing, rhonchi or rales.  Musculoskeletal:        General: Normal range of motion.     Cervical back: Normal range of motion.     Right lower leg: Edema present.     Left lower leg: Edema present.  Skin:    General: Skin is warm and dry.     Findings: No wound.  Neurological:     Mental Status: He is alert and oriented to person, place, and time. Mental status is at baseline.  Psychiatric:        Mood and Affect: Mood normal.        Behavior: Behavior normal.        Thought Content: Thought content normal.        Judgment: Judgment normal.      Lab Results  Component Value Date   TSH 2.930 09/16/2019   Lab Results  Component Value Date   WBC 7.5 10/19/2020   HGB 15.1 10/19/2020   HCT 43.5 10/19/2020   MCV 89 10/19/2020   PLT 232 10/19/2020   Lab Results  Component Value Date   NA 139 10/19/2020   K 4.9 10/19/2020   CO2 25 10/19/2020   GLUCOSE 85 10/19/2020   BUN 14 10/19/2020   CREATININE 1.09 10/19/2020   BILITOT 0.6 10/19/2020   ALKPHOS 80 10/19/2020   AST 13 10/19/2020   ALT 8 10/19/2020   PROT 5.9 (L) 10/19/2020   ALBUMIN 3.8 10/19/2020   CALCIUM 9.4 10/19/2020   ANIONGAP 8 05/13/2020   EGFR 65 10/19/2020   Lab Results  Component Value Date   CHOL 151 10/19/2020   Lab Results  Component Value Date   HDL 63 10/19/2020   Lab Results  Component Value Date   LDLCALC 73 10/19/2020   Lab Results  Component Value Date   TRIG 80 10/19/2020   Lab Results  Component Value Date   CHOLHDL 2.4 10/19/2020   Lab Results  Component Value Date   HGBA1C 5.1 03/31/2020

## 2021-09-13 LAB — CMP14+EGFR
ALT: 14 IU/L (ref 0–44)
AST: 21 IU/L (ref 0–40)
Albumin/Globulin Ratio: 1.9 (ref 1.2–2.2)
Albumin: 4.1 g/dL (ref 3.6–4.6)
Alkaline Phosphatase: 74 IU/L (ref 44–121)
BUN/Creatinine Ratio: 12 (ref 10–24)
BUN: 13 mg/dL (ref 10–36)
Bilirubin Total: 0.7 mg/dL (ref 0.0–1.2)
CO2: 25 mmol/L (ref 20–29)
Calcium: 9.4 mg/dL (ref 8.6–10.2)
Chloride: 97 mmol/L (ref 96–106)
Creatinine, Ser: 1.06 mg/dL (ref 0.76–1.27)
Globulin, Total: 2.2 g/dL (ref 1.5–4.5)
Glucose: 99 mg/dL (ref 70–99)
Potassium: 4.8 mmol/L (ref 3.5–5.2)
Sodium: 138 mmol/L (ref 134–144)
Total Protein: 6.3 g/dL (ref 6.0–8.5)
eGFR: 67 mL/min/{1.73_m2} (ref >59)

## 2021-09-13 LAB — CBC WITH DIFFERENTIAL/PLATELET
Basophils Absolute: 0 10*3/uL (ref 0.0–0.2)
Basos: 1 %
EOS (ABSOLUTE): 0.1 10*3/uL (ref 0.0–0.4)
Eos: 1 %
Hematocrit: 48 % (ref 37.5–51.0)
Hemoglobin: 16.6 g/dL (ref 13.0–17.7)
Immature Grans (Abs): 0 10*3/uL (ref 0.0–0.1)
Immature Granulocytes: 0 %
Lymphocytes Absolute: 2.3 10*3/uL (ref 0.7–3.1)
Lymphs: 29 %
MCH: 32 pg (ref 26.6–33.0)
MCHC: 34.6 g/dL (ref 31.5–35.7)
MCV: 93 fL (ref 79–97)
Monocytes Absolute: 1.1 10*3/uL — ABNORMAL HIGH (ref 0.1–0.9)
Monocytes: 14 %
Neutrophils Absolute: 4.4 10*3/uL (ref 1.4–7.0)
Neutrophils: 55 %
Platelets: 237 10*3/uL (ref 150–450)
RBC: 5.18 x10E6/uL (ref 4.14–5.80)
RDW: 12.4 % (ref 11.6–15.4)
WBC: 7.8 10*3/uL (ref 3.4–10.8)

## 2021-09-13 LAB — LIPID PANEL
Chol/HDL Ratio: 2.4 ratio (ref 0.0–5.0)
Cholesterol, Total: 157 mg/dL (ref 100–199)
HDL: 65 mg/dL (ref >39)
LDL Chol Calc (NIH): 77 mg/dL (ref 0–99)
Triglycerides: 81 mg/dL (ref 0–149)
VLDL Cholesterol Cal: 15 mg/dL (ref 5–40)

## 2021-09-13 LAB — VITAMIN D 25 HYDROXY (VIT D DEFICIENCY, FRACTURES): Vit D, 25-Hydroxy: 26.5 ng/mL — ABNORMAL LOW (ref 30.0–100.0)

## 2021-09-21 ENCOUNTER — Telehealth: Payer: Self-pay | Admitting: Family Medicine

## 2021-09-21 NOTE — Telephone Encounter (Signed)
Patients daughter aware that I will discuss with our Director and verify that we are able to allow them to use a room. I told her I felt like this would be ok. Appointment is on 09/06 @ 1:15pm

## 2021-09-21 NOTE — Telephone Encounter (Signed)
Daughter would like to know if we can provider space for her and her dad to do a video visit with a specialist for patient.  Daughter lives 5 hours away and patient does not have wifi at his house.

## 2021-09-27 NOTE — Telephone Encounter (Signed)
Patient daughter aware that we will be glad to let them use a room for his appointment. She is aware to ask for Abigail Butts or Roselyn Reef the day of her appointment.

## 2021-10-27 ENCOUNTER — Ambulatory Visit (INDEPENDENT_AMBULATORY_CARE_PROVIDER_SITE_OTHER): Payer: Medicare Other | Admitting: Nurse Practitioner

## 2021-10-27 ENCOUNTER — Encounter: Payer: Self-pay | Admitting: Nurse Practitioner

## 2021-10-27 ENCOUNTER — Encounter: Payer: Self-pay | Admitting: Family Medicine

## 2021-10-27 VITALS — BP 192/85 | HR 58 | Temp 97.9°F | Resp 20 | Ht 73.0 in | Wt 249.0 lb

## 2021-10-27 DIAGNOSIS — R600 Localized edema: Secondary | ICD-10-CM

## 2021-10-27 DIAGNOSIS — L97929 Non-pressure chronic ulcer of unspecified part of left lower leg with unspecified severity: Secondary | ICD-10-CM | POA: Diagnosis not present

## 2021-10-27 DIAGNOSIS — I83029 Varicose veins of left lower extremity with ulcer of unspecified site: Secondary | ICD-10-CM

## 2021-10-27 DIAGNOSIS — I83892 Varicose veins of left lower extremities with other complications: Secondary | ICD-10-CM | POA: Diagnosis not present

## 2021-10-27 NOTE — Progress Notes (Signed)
   Subjective:    Patient ID: [name], male    DOB: 01-Nov-1929, 88 y.o.   MRN: [id number]   Chief Complaint: Left leg swelling and red   HPI Patient comes in today c/o left lower leg swelling and red. He has had issues with this leg for several years. Has not been seen in the last several months with leg problems. He wears compression sock daily. He says the swelling goes down at night.    Review of Systems  Cardiovascular:  Positive for leg swelling.       Objective:   Physical Exam Constitutional:      Appearance: Normal appearance.  Cardiovascular:     Rate and Rhythm: Normal rate and regular rhythm.     Pulses: Normal pulses.     Heart sounds: Normal heart sounds.  Musculoskeletal:     Left lower leg: Edema (2+) present.  Skin:    General: Skin is warm.  Neurological:     General: No focal deficit present.     Mental Status: He is alert and oriented to person, place, and time.  Psychiatric:        Behavior: Behavior normal.    BP (!) 192/85   Pulse (!) 58   Temp 97.9 F (36.6 C) (Temporal)   Resp 20   Ht '6\' 1"'$  (1.854 m)   Wt 249 lb (112.9 kg)   SpO2 100%   BMI 32.85 kg/m         Assessment & Plan:   [name] in today with chief complaint of Left leg swelling and red   1. Venous stasis ulcer of left lower leg with edema of left lower leg (HCC) Elevate leg when sitting Recheck next tuesday - Apply unna boot    The above assessment and management plan was discussed with the patient. The patient verbalized understanding of and has agreed to the management plan. Patient is aware to call the clinic if symptoms persist or worsen. Patient is aware when to return to the clinic for a follow-up visit. Patient educated on when it is appropriate to go to the emergency department.   Mary-Margaret Hassell Done, FNP

## 2021-10-27 NOTE — Patient Instructions (Signed)
[facility] An [employer] is a type of bandage (dressing) for the foot and leg. The dressing is a gauze wrap that is soaked with a type of medicine called zinc oxide. The gauze may also include other lotions and medicines that help in wound healing, such as calamine. An Unna boot may be used to treat: Open sores (ulcers) on the foot, heel, or leg. Swelling from disorders that affect the veins or lymphatic system (lymphedema). Skin conditions such as chronic inflammation caused by poor blood flow (stasis dermatitis). The dressing is applied by a health care provider. The gauze is wrapped around your lower extremity in several layers, usually starting at the toes and going upward to the knee. A dry outer wrap goes over the medicated wrap for support and compression.  Before applying the [employer], your health care provider will clean your leg and foot and may apply an antibiotic ointment. You may be asked to raise (elevate) your leg for a while to reduce swelling before the boot is applied. The boot will dry and harden after it is applied. The boot may need to be changed or replaced about twice a week. Follow these instructions at home: [place] as told by your health care provider. You may need to wear a slipper or shoe over the boot that is one or two sizes larger than normal. Check the skin around the boot every day. Tell your health care provider about any concerns. Do not stick anything inside the boot to scratch your skin. Doing that increases your risk of infection. Keep your [employer] clean and dry. Check every day for signs of infection. Check for: Redness, swelling, or pain in your foot or toes. Fluid or blood coming from the boot. Pus or a bad smell coming from the boot. Remove the boot and call your health care provider if you have signs of poor blood flow, such as: Your toes tingle or become numb. Your toes turn cold or turn blue or pale. Your toes are more  swollen or painful. You are unable to move your toes. Activity You may walk with the boot once it has dried. Ask your health care provider how much walking is safe for you. Avoid sitting for a long time without moving. Get up to take short walks as told by your health care provider. This is important to improve blood flow. Bathing Do not take baths, swim, or use a hot tub until your health care provider approves. Ask your health care provider if you may take showers. If your health care provider approves a bath or a shower, do not let the Unna boot get wet. If you take a shower, cover the boot with a watertight covering. If you take a bath, keep your leg with the boot out of the tub. General instructions Keep your leg elevated above the level of your heart while you are sitting or lying down. This will decrease swelling. Do not sit with your knee bent for long periods of time. Take over-the-counter and prescription medicines only as told by your health care provider. Do not use any products that contain nicotine or tobacco, such as cigarettes, e-cigarettes, and chewing tobacco. These can delay healing. If you need help quitting, ask your health care provider. Keep all follow-up visits as told by your health care provider. This is important. Contact a health care provider if: Your skin feels itchy inside the boot. You have a burning sensation, a  rash, or itchy, red, swollen areas of skin (hives) in the boot area. You have a fever or chills. You have any signs of infection, such as: New redness, swelling, or pain. More fluid or blood coming from the boot. Pus or a bad smell coming from the boot. You have increased numbness or pain in your foot or toes. You have any changes in skin color on your foot or toes, such as the skin turning blue or pale or developing patchy areas with spots. Your boot has been damaged or feels like it is no longer fitting properly. Summary An Louretta [place] boot is a type of  bandage (dressing) system for the foot and leg. The dressing is a gauze wrap that is soaked with a type of medicine (zinc oxide) to treat foot, heel, or leg ulcers, swelling from disorders that affect the veins or lymphatic system (lymphedema), and skin conditions caused by poor blood flow (stasis dermatitis). This dressing is applied by a health care provider. After it is applied, the boot will dry and harden. The boot may need to be changed or replaced about twice a week. Let your health care provider know if you have any signs of poor blood flow or infection. This information is not intended to replace advice given to you by your health care provider. Make sure you discuss any questions you have with your health care provider. Document Revised: 11/17/2020 Document Reviewed: 11/17/2020 Elsevier Patient Education  [place].

## 2021-10-31 ENCOUNTER — Encounter: Payer: Self-pay | Admitting: Family Medicine

## 2021-10-31 ENCOUNTER — Ambulatory Visit (INDEPENDENT_AMBULATORY_CARE_PROVIDER_SITE_OTHER): Payer: Medicare Other | Admitting: Family Medicine

## 2021-10-31 VITALS — BP 139/74 | HR 65 | Temp 97.8°F | Resp 20 | Ht 73.0 in | Wt 248.0 lb

## 2021-10-31 DIAGNOSIS — I872 Venous insufficiency (chronic) (peripheral): Secondary | ICD-10-CM | POA: Diagnosis not present

## 2021-10-31 DIAGNOSIS — L602 Onychogryphosis: Secondary | ICD-10-CM | POA: Diagnosis not present

## 2021-10-31 NOTE — Progress Notes (Signed)
Assessment & Plan:  1. Chronic venous insufficiency Unna boots not reapplied since patient does not have an open wounds on his legs. Encouraged to continue elevating feet as often as possible and wearing compression hose daily (applied first thing in the morning and removed at bedtime). He will start using the pneumatic pumps as soon as he is taught how by the company.   2. Long toenail - Ambulatory referral to Podiatry  3. Thickened nails - Ambulatory referral to Podiatry   Follow up plan: Return if symptoms worsen or fail to improve.  Hendricks Limes, MSN, APRN, FNP-C [facility]  Subjective:   Patient ID: [name], male    DOB: 11/27/1929, 88 y.o.   MRN: [id number]  HPI: [name] is a 88 y.o. male presenting on 10/31/2021 for Leg Swelling (Unna boot change )  Patient is here to follow-up on his leg swelling. He was seen four days ago due to swelling of the left leg. An unna boot was applied at that time. His daughter sent a message yesterday informing me he does now have the pneumatic pumps at home, but has not yet started using them as the rep from the company has not come out to show him how yet. He reports he is wearing compression hose daily as recommended.   ROS: Negative unless specifically indicated above in HPI.   Relevant past medical history reviewed and updated as indicated.   Allergies and medications reviewed and updated.   Current Outpatient Medications:    amLODipine (NORVASC) 10 MG tablet, Take 1 tablet (10 mg total) by mouth daily., Disp: 90 tablet, Rfl: 1   apixaban (ELIQUIS) 5 MG TABS tablet, Take 1 tablet (5 mg total) by mouth 2 (two) times daily., Disp: 180 tablet, Rfl: 1   fish oil-omega-3 fatty acids 1000 MG capsule, Take 2 g by mouth daily., Disp: , Rfl:    furosemide (LASIX) 20 MG tablet, Take 1 tablet (20 mg total) by mouth daily., Disp: 90 tablet, Rfl: 1   hydrocortisone 1 % lotion, Apply 1 Application topically 2  (two) times daily., Disp: 118 mL, Rfl: 0   lisinopril (ZESTRIL) 40 MG tablet, TAKE ONE TABLET ONCE DAILY, Disp: 30 tablet, Rfl: 5   omeprazole (PRILOSEC) 20 MG capsule, Take 1 capsule (20 mg total) by mouth daily., Disp: 90 capsule, Rfl: 1   polyethylene glycol (MIRALAX / GLYCOLAX) 17 g packet, Take 17 g by mouth daily as needed for mild constipation., Disp: 14 each, Rfl: 0   rosuvastatin (CRESTOR) 5 MG tablet, TAKE 1/2 TABLET ON MONDAY, WEDNESDAY AND FRIDAY AT 6PM, Disp: 6 tablet, Rfl: 5   triamcinolone ointment (KENALOG) 0.1 %, Apply 1 application topically 2 (two) times daily., Disp: 454 g, Rfl: 0  No Known Allergies  Objective:   BP 139/74   Pulse 65   Temp 97.8 F (36.6 C)   Resp 20   Ht '6\' 1"'$  (1.854 m)   Wt 248 lb (112.5 kg)   SpO2 97%   BMI 32.72 kg/m    Physical Exam Vitals reviewed.  Constitutional:      General: He is not in acute distress.    Appearance: Normal appearance. He is not ill-appearing, toxic-appearing or diaphoretic.  HENT:     Head: Normocephalic and atraumatic.  Eyes:     General: No scleral icterus.       Right eye: No discharge.        Left eye: No discharge.  Conjunctiva/sclera: Conjunctivae normal.  Cardiovascular:     Rate and Rhythm: Normal rate.  Pulmonary:     Effort: Pulmonary effort is normal. No respiratory distress.  Musculoskeletal:        General: Normal range of motion.     Cervical back: Normal range of motion.     Right lower leg: No edema.     Left lower leg: No edema.  Feet:     Right foot:     Toenail Condition: Right toenails are abnormally thick and long. Fungal disease present.    Left foot:     Toenail Condition: Left toenails are abnormally thick and long. Fungal disease present. Skin:    General: Skin is warm and dry.  Neurological:     Mental Status: He is alert and oriented to person, place, and time. Mental status is at baseline.  Psychiatric:        Mood and Affect: Mood normal.        Behavior: Behavior  normal.        Thought Content: Thought content normal.        Judgment: Judgment normal.

## 2021-10-31 NOTE — Patient Instructions (Signed)
Dr. Irving Shows [address], [place], [place] [address]

## 2021-11-20 ENCOUNTER — Encounter: Payer: Self-pay | Admitting: *Deleted

## 2021-11-20 ENCOUNTER — Ambulatory Visit: Payer: Self-pay | Admitting: *Deleted

## 2021-11-20 DIAGNOSIS — M792 Neuralgia and neuritis, unspecified: Secondary | ICD-10-CM | POA: Diagnosis not present

## 2021-11-20 DIAGNOSIS — M79641 Pain in right hand: Secondary | ICD-10-CM | POA: Diagnosis not present

## 2021-11-20 NOTE — Patient Outreach (Signed)
  Care Coordination   Initial Visit Note   11/20/2021  Name: [name] MRN: [id number] DOB: 1929/03/24  [name] is a 88 y.o. year old male who sees Stacks, Tyler Gash, MD for primary care. I spoke with [name], [name] by phone today.  What matters to the patients health and wellness today?  No Interventions Identified.   SDOH assessments and interventions completed:  Yes.  SDOH Interventions Today    Flowsheet Row Most Recent Value  SDOH Interventions   Food Insecurity Interventions Intervention Not Indicated, Other (Comment)  [Verified by [name], [name]]  Housing Interventions Intervention Not Indicated, Other (Comment)  [Verified by [name], [name]]  Transportation Interventions Intervention Not Indicated, Other (Comment)  [Verified by [name], [name]]  Utilities Interventions Intervention Not Indicated, Other (Comment)  [Verified by [name], [name]]  Alcohol Usage Interventions Intervention Not Indicated (Score <7), Other (Comment)  [Verified by [name], [name]]  Financial Strain Interventions Intervention Not Indicated, Other (Comment)  [Verified by [name], [name]]  Physical Activity Interventions Intervention Not Indicated, Other (Comments)  [Verified by [name], [name]]  Stress Interventions Intervention Not Indicated, Other (Comment)  [Verified by [name], [name]]  Social Connections Interventions Intervention Not Indicated, Other (Comment)  [Verified by [name], [name]]         Care Coordination Interventions Activated:  Yes.    Care Coordination Interventions:  Yes, provided.    Follow up plan: No further intervention required.    Encounter Outcome:  Pt. Visit Completed.    [name], BSW, MSW, LCSW  Licensed Education officer, [facility]  Mailing [place] [address] [place], [place] [address] Physical  Address-[address], [place], [place] [address] Toll Free Main # [phone] Fax # [phone] Cell # [phone] [name]

## 2021-11-20 NOTE — Patient Instructions (Signed)
Visit Information  Thank you for taking time to visit with me today. Please don't hesitate to contact me if I can be of assistance to you.   Please call the care guide team at [phone] if you need to cancel or reschedule your appointment.   If you are experiencing a Mental Health or Behavioral Health Crisis or need someone to talk to, please call the Suicide and Crisis Lifeline: 988 call the [place] National Suicide Prevention Lifeline: 1-800-273-8255 or TTY: 1-800-799-4 TTY (1-800-799-4889) to talk to a trained counselor call 1-800-273-TALK (toll free, 24 hour hotline) go to [facility] [address], [place] ([phone]) call the [place] Crisis Line: 800-939-9988 call 911  Patient verbalizes understanding of instructions and care plan provided today and agrees to view in MyChart. Active MyChart status and patient understanding of how to access instructions and care plan via MyChart confirmed with patient.     No further follow up required.  Alee Gressman, BSW, MSW, LCSW  Licensed Clinical Social Worker  [facility]  Mailing Address-[address], [place], [place] [address] Physical Address-[address], [place], [place] [address] Toll Free Main # [phone] Fax # [phone] Cell # [phone] [email or web address]            

## 2021-11-22 ENCOUNTER — Encounter: Payer: Self-pay | Admitting: Family Medicine

## 2021-11-22 ENCOUNTER — Ambulatory Visit (INDEPENDENT_AMBULATORY_CARE_PROVIDER_SITE_OTHER): Payer: Medicare Other | Admitting: Family Medicine

## 2021-11-22 ENCOUNTER — Telehealth: Payer: Self-pay | Admitting: Family Medicine

## 2021-11-22 ENCOUNTER — Ambulatory Visit (INDEPENDENT_AMBULATORY_CARE_PROVIDER_SITE_OTHER): Payer: Medicare Other | Admitting: *Deleted

## 2021-11-22 VITALS — BP 143/72 | HR 47 | Temp 98.2°F | Ht 73.0 in | Wt 245.0 lb

## 2021-11-22 DIAGNOSIS — R5383 Other fatigue: Secondary | ICD-10-CM | POA: Diagnosis not present

## 2021-11-22 DIAGNOSIS — R051 Acute cough: Secondary | ICD-10-CM | POA: Diagnosis not present

## 2021-11-22 DIAGNOSIS — I10 Essential (primary) hypertension: Secondary | ICD-10-CM

## 2021-11-22 NOTE — Progress Notes (Signed)
BP (!) 143/72   Pulse (!) 47   Temp 98.2 F (36.8 C)   Ht _0  (1.854 m)   Wt 245 lb (111.1 kg)   SpO2 96%   BMI 32.32 kg/m    Subjective:   Patient ID: [name], male    DOB: April 21, 1929, 88 y.o.   MRN: [id number]  HPI: [name] is a 88 y.o. male presenting on 11/22/2021 for Hypertension (Pt states that he just feels off)   HPI Patient comes in today stating he just has been feeling off for the past 2 days and just feeling weak and not like himself.  He says is been going on for 2 days.  He denies any fevers or chills or shortness of breath does not feel right feels weak and feels off.  He denies any urinary or bowel symptoms.  He has had some increased coughing over the past couple days.  Relevant past medical, surgical, family and social history reviewed and updated as indicated. Interim medical history since our last visit reviewed. Allergies and medications reviewed and updated.  Review of Systems  Constitutional:  Positive for fatigue. Negative for chills and fever.  HENT:  Positive for congestion. Negative for rhinorrhea, sinus pressure, sinus pain, sore throat and voice change.   Eyes:  Negative for visual disturbance.  Respiratory:  Positive for cough. Negative for shortness of breath and wheezing.   Cardiovascular:  Negative for chest pain and leg swelling.  Musculoskeletal:  Negative for back pain and gait problem.  Skin:  Negative for rash.  All other systems reviewed and are negative.   Per HPI unless specifically indicated above   Allergies as of 11/22/2021   No Known Allergies      Medication List        Accurate as of November 22, 2021  3:18 PM. If you have any questions, ask your nurse or doctor.          amLODipine 10 MG tablet Commonly known as: NORVASC Take 1 tablet (10 mg total) by mouth daily.   apixaban 5 MG Tabs tablet Commonly known as: ELIQUIS Take 1 tablet (5 mg total) by mouth 2 (two) times daily.   fish oil-omega-3  fatty acids 1000 MG capsule Take 2 g by mouth daily.   furosemide 20 MG tablet Commonly known as: LASIX Take 1 tablet (20 mg total) by mouth daily.   hydrocortisone 1 % lotion Apply 1 Application topically 2 (two) times daily.   lisinopril 40 MG tablet Commonly known as: ZESTRIL TAKE ONE TABLET ONCE DAILY   omeprazole 20 MG capsule Commonly known as: PRILOSEC Take 1 capsule (20 mg total) by mouth daily.   polyethylene glycol 17 g packet Commonly known as: MIRALAX / GLYCOLAX Take 17 g by mouth daily as needed for mild constipation.   rosuvastatin 5 MG tablet Commonly known as: CRESTOR TAKE 1/2 TABLET ON MONDAY, WEDNESDAY AND FRIDAY AT 6PM   triamcinolone ointment 0.1 % Commonly known as: KENALOG Apply 1 application topically 2 (two) times daily.         Objective:   BP (!) 143/72   Pulse (!) 47   Temp 98.2 F (36.8 C)   Ht _1  (1.854 m)   Wt 245 lb (111.1 kg)   SpO2 96%   BMI 32.32 kg/m   Wt Readings from Last 3 Encounters:  11/22/21 245 lb (111.1 kg)  11/22/21 245 lb (111.1 kg)  10/31/21 248 lb (112.5 kg)  Physical Exam Vitals and nursing note reviewed.  Constitutional:      General: He is not in acute distress.    Appearance: He is well-developed. He is not diaphoretic.  Eyes:     General: No scleral icterus.    Conjunctiva/sclera: Conjunctivae normal.  Neck:     Thyroid: No thyromegaly.  Cardiovascular:     Rate and Rhythm: Regular rhythm. Bradycardia present.     Heart sounds: Normal heart sounds. No murmur heard. Pulmonary:     Effort: Pulmonary effort is normal. No respiratory distress.     Breath sounds: Normal breath sounds. No wheezing.  Musculoskeletal:        General: No swelling. Normal range of motion.     Cervical back: Neck supple.  Lymphadenopathy:     Cervical: No cervical adenopathy.  Skin:    General: Skin is warm and dry.     Findings: No rash.  Neurological:     Mental Status: He is alert and oriented to person,  place, and time.     Coordination: Coordination normal.  Psychiatric:        Behavior: Behavior normal.       Assessment & Plan:   Problem List Items Addressed This Visit   None Visit Diagnoses     Acute cough    -  Primary   Relevant Orders   CBC with Differential/Platelet   CMP14+EGFR   Novel Coronavirus, NAA ([facility])   Other fatigue       Relevant Orders   CBC with Differential/Platelet   CMP14+EGFR   Novel Coronavirus, NAA ([facility])       Patient is slightly bradycardic but he is normally slightly bradycardic so that has not changed and blood pressure looks decent today.  We will test him for COVID and CBC for infection markers.  Lungs sound clear Follow up plan: Return if symptoms worsen or fail to improve.  Counseling provided for all of the vaccine components Orders Placed This Encounter  Procedures   Novel Coronavirus, NAA ([facility])   CBC with Differential/Platelet   CMP14+EGFR    Caryl Pina, MD [facility] 11/22/2021, 3:18 PM

## 2021-11-23 LAB — CBC WITH DIFFERENTIAL/PLATELET
Basophils Absolute: 0.1 10*3/uL (ref 0.0–0.2)
Basos: 1 %
EOS (ABSOLUTE): 0.1 10*3/uL (ref 0.0–0.4)
Eos: 1 %
Hematocrit: 48.1 % (ref 37.5–51.0)
Hemoglobin: 16.4 g/dL (ref 13.0–17.7)
Immature Grans (Abs): 0 10*3/uL (ref 0.0–0.1)
Immature Granulocytes: 0 %
Lymphocytes Absolute: 2.2 10*3/uL (ref 0.7–3.1)
Lymphs: 25 %
MCH: 32.3 pg (ref 26.6–33.0)
MCHC: 34.1 g/dL (ref 31.5–35.7)
MCV: 95 fL (ref 79–97)
Monocytes Absolute: 1.3 10*3/uL — ABNORMAL HIGH (ref 0.1–0.9)
Monocytes: 15 %
Neutrophils Absolute: 4.9 10*3/uL (ref 1.4–7.0)
Neutrophils: 58 %
Platelets: 238 10*3/uL (ref 150–450)
RBC: 5.07 x10E6/uL (ref 4.14–5.80)
RDW: 12.1 % (ref 11.6–15.4)
WBC: 8.5 10*3/uL (ref 3.4–10.8)

## 2021-11-23 LAB — CMP14+EGFR
ALT: 12 IU/L (ref 0–44)
AST: 19 IU/L (ref 0–40)
Albumin/Globulin Ratio: 1.8 (ref 1.2–2.2)
Albumin: 4.2 g/dL (ref 3.6–4.6)
Alkaline Phosphatase: 71 IU/L (ref 44–121)
BUN/Creatinine Ratio: 12 (ref 10–24)
BUN: 14 mg/dL (ref 10–36)
Bilirubin Total: 0.6 mg/dL (ref 0.0–1.2)
CO2: 28 mmol/L (ref 20–29)
Calcium: 9.4 mg/dL (ref 8.6–10.2)
Chloride: 98 mmol/L (ref 96–106)
Creatinine, Ser: 1.14 mg/dL (ref 0.76–1.27)
Globulin, Total: 2.3 g/dL (ref 1.5–4.5)
Glucose: 81 mg/dL (ref 70–99)
Potassium: 4.9 mmol/L (ref 3.5–5.2)
Sodium: 140 mmol/L (ref 134–144)
Total Protein: 6.5 g/dL (ref 6.0–8.5)
eGFR: 61 mL/min/{1.73_m2} (ref >59)

## 2021-11-23 LAB — NOVEL CORONAVIRUS, NAA: SARS-CoV-2, NAA: NOT DETECTED

## 2021-11-27 ENCOUNTER — Ambulatory Visit: Payer: Medicare Other | Admitting: Family Medicine

## 2021-12-01 ENCOUNTER — Ambulatory Visit (INDEPENDENT_AMBULATORY_CARE_PROVIDER_SITE_OTHER): Payer: Medicare Other | Admitting: Nurse Practitioner

## 2021-12-01 ENCOUNTER — Encounter: Payer: Self-pay | Admitting: Nurse Practitioner

## 2021-12-01 DIAGNOSIS — Z91199 Patient's noncompliance with other medical treatment and regimen due to unspecified reason: Secondary | ICD-10-CM

## 2021-12-01 NOTE — Progress Notes (Signed)
   Patients daughter is not aware of virtual visits and the reason for the phone call. She states that patient has an appointment with DR stacks on Monday to establish care.

## 2021-12-04 ENCOUNTER — Encounter: Payer: Self-pay | Admitting: Family Medicine

## 2021-12-04 ENCOUNTER — Ambulatory Visit (INDEPENDENT_AMBULATORY_CARE_PROVIDER_SITE_OTHER): Payer: Medicare Other | Admitting: Family Medicine

## 2021-12-04 ENCOUNTER — Telehealth: Payer: Self-pay | Admitting: Family Medicine

## 2021-12-04 VITALS — BP 164/90 | HR 60 | Temp 98.3°F | Ht 73.0 in | Wt 238.8 lb

## 2021-12-04 DIAGNOSIS — I872 Venous insufficiency (chronic) (peripheral): Secondary | ICD-10-CM | POA: Diagnosis not present

## 2021-12-04 DIAGNOSIS — Z23 Encounter for immunization: Secondary | ICD-10-CM | POA: Diagnosis not present

## 2021-12-04 DIAGNOSIS — I10 Essential (primary) hypertension: Secondary | ICD-10-CM

## 2021-12-04 DIAGNOSIS — R051 Acute cough: Secondary | ICD-10-CM | POA: Diagnosis not present

## 2021-12-04 DIAGNOSIS — I4821 Permanent atrial fibrillation: Secondary | ICD-10-CM

## 2021-12-04 DIAGNOSIS — E559 Vitamin D deficiency, unspecified: Secondary | ICD-10-CM

## 2021-12-04 DIAGNOSIS — E782 Mixed hyperlipidemia: Secondary | ICD-10-CM | POA: Diagnosis not present

## 2021-12-04 MED ORDER — VITAMIN D (ERGOCALCIFEROL) 1.25 MG (50000 UNIT) PO CAPS: 50000.0000 [IU] | ORAL_CAPSULE | ORAL | 3 refills | Status: DC

## 2021-12-04 MED ORDER — METOPROLOL SUCCINATE ER 25 MG PO TB24: 25.0000 mg | ORAL_TABLET | Freq: Every day | ORAL | 3 refills | Status: DC

## 2021-12-04 NOTE — Progress Notes (Signed)
Subjective:  Patient ID: [name], male    DOB: 04-16-1929  Age: 88 y.o. MRN: [id number]  CC: Establish Care   HPI [name] presents for cough for a week or two. Denies fever & dyspnea.   Atrial fibrillation follow up. Pt. is treated with rate control and anticoagulation. Pt.  denies palpitations, rapid rate, chest pain, dyspnea and edema. There has been no bleeding from nose or gums. Pt. has not noticed blood with urine or stool.  Although there is routine bruising easily, it is not excessive.  Patient in for follow-up of GERD. Currently asymptomatic taking  PPI daily. There is no chest pain or heartburn. No hematemesis and no melena. No dysphagia or choking. Onset is remote. Progression is stable. Complicating factors, none.   in for follow-up of elevated cholesterol. Doing well without complaints on current medication. Denies side effects of statin including myalgia and arthralgia and nausea. Currently no chest pain, shortness of breath or other cardiovascular related symptoms noted.   presents for  follow-up of hypertension. Patient has no history of headache chest pain or shortness of breath or recent cough. Patient also denies symptoms of TIA such as focal numbness or weakness. Patient denies side effects from medication. States taking it regularly.  Taking OTC vitamin D for deficincy. Recent labs reviewed.     12/04/2021    3:54 PM 11/20/2021   12:22 PM 10/27/2021    3:53 PM  Depression screen PHQ 2/9  Decreased Interest 0 0 0  Down, Depressed, Hopeless 0 0 0  PHQ - 2 Score 0 0 0    History [name] has a past medical history of Arthritis, Atrial fibrillation ([place]) (04/28/2020), Hyperlipidemia, Hypertension, and Skin cancer.   [name] has a past surgical history that includes Joint replacement (Left); Appendectomy; Skin lesion excision; and Total hip arthroplasty (Right, 05/11/2020).   His family history includes Cancer in his mother; Dementia in his brother; Healthy in  his daughter, son, and son; Stroke in his father.[name] reports that [name] has never smoked. [name] has never been exposed to tobacco smoke. [name] has never used smokeless tobacco. [name] reports that [name] does not drink alcohol and does not use drugs.    ROS Review of Systems  Constitutional:  Negative for fever.  Respiratory:  Negative for shortness of breath.   Cardiovascular:  Negative for chest pain.  Musculoskeletal:  Negative for arthralgias.  Skin:  Negative for rash.    Objective:  BP (!) 164/90   Pulse 60   Temp 98.3 F (36.8 C)   Ht '6\' 1"'$  (1.854 m)   Wt 238 lb 12.8 oz (108.3 kg)   SpO2 98%   BMI 31.51 kg/m   BP Readings from Last 3 Encounters:  12/04/21 (!) 164/90  11/22/21 (!) 143/72  11/22/21 (!) 143/72    Wt Readings from Last 3 Encounters:  12/04/21 238 lb 12.8 oz (108.3 kg)  11/22/21 245 lb (111.1 kg)  11/22/21 245 lb (111.1 kg)     Physical Exam Constitutional:      General: [name] is not in acute distress.    Appearance: [name] is well-developed.  HENT:     Head: Normocephalic and atraumatic.     Right Ear: External ear normal.     Left Ear: External ear normal.     Nose: Nose normal.  Eyes:     Conjunctiva/sclera: Conjunctivae normal.     Pupils: Pupils are equal, round, and reactive to light.  Cardiovascular:     Rate  and Rhythm: Normal rate and regular rhythm.     Heart sounds: Normal heart sounds. No murmur heard. Pulmonary:     Effort: Pulmonary effort is normal. No respiratory distress.     Breath sounds: Normal breath sounds. No wheezing or rales.  Abdominal:     Palpations: Abdomen is soft.     Tenderness: There is no abdominal tenderness.  Musculoskeletal:        General: Normal range of motion.     Cervical back: Normal range of motion and neck supple.  Skin:    General: Skin is warm and dry.  Neurological:     Mental Status: [name] is alert and oriented to person, place, and time.     Deep Tendon Reflexes: Reflexes are normal and symmetric.  Psychiatric:         Behavior: Behavior normal.        Thought Content: Thought content normal.        Judgment: Judgment normal.       Assessment & Plan:   [name] was seen today for establish care.  Diagnoses and all orders for this visit:  Permanent atrial fibrillation ([place]) -     COVID-19, Flu A+B and RSV  Essential hypertension -     COVID-19, Flu A+B and RSV  Vitamin D deficiency  Chronic venous insufficiency  Mixed hyperlipidemia  Acute cough  Need for immunization against influenza -     Flu Vaccine QUAD High Dose(Fluad)  Other orders -     Vitamin D, Ergocalciferol, (DRISDOL) 1.25 MG (50000 UNIT) CAPS capsule; Take 1 capsule (50,000 Units total) by mouth every 7 (seven) days. -     metoprolol succinate (TOPROL-XL) 25 MG 24 hr tablet; Take 1 tablet (25 mg total) by mouth daily.       I am having [name] start on Vitamin D (Ergocalciferol) and metoprolol succinate. I am also having him maintain his fish oil-omega-3 fatty acids, polyethylene glycol, apixaban, triamcinolone ointment, omeprazole, amLODipine, furosemide, lisinopril, rosuvastatin, and hydrocortisone.  Allergies as of 12/04/2021   No Known Allergies      Medication List        Accurate as of December 04, 2021  8:50 PM. If you have any questions, ask your nurse or doctor.          amLODipine 10 MG tablet Commonly known as: NORVASC Take 1 tablet (10 mg total) by mouth daily.   apixaban 5 MG Tabs tablet Commonly known as: ELIQUIS Take 1 tablet (5 mg total) by mouth 2 (two) times daily.   fish oil-omega-3 fatty acids 1000 MG capsule Take 2 g by mouth daily.   furosemide 20 MG tablet Commonly known as: LASIX Take 1 tablet (20 mg total) by mouth daily.   hydrocortisone 1 % lotion Apply 1 Application topically 2 (two) times daily.   lisinopril 40 MG tablet Commonly known as: ZESTRIL TAKE ONE TABLET ONCE DAILY   metoprolol succinate 25 MG 24 hr tablet Commonly known as: TOPROL-XL Take  1 tablet (25 mg total) by mouth daily. Started by: Claretta Fraise, MD   omeprazole 20 MG capsule Commonly known as: PRILOSEC Take 1 capsule (20 mg total) by mouth daily.   polyethylene glycol 17 g packet Commonly known as: MIRALAX / GLYCOLAX Take 17 g by mouth daily as needed for mild constipation.   rosuvastatin 5 MG tablet Commonly known as: CRESTOR TAKE 1/2 TABLET ON MONDAY, WEDNESDAY AND FRIDAY AT 6PM   triamcinolone ointment 0.1 % Commonly known  as: KENALOG Apply 1 application topically 2 (two) times daily.   Vitamin D (Ergocalciferol) 1.25 MG (50000 UNIT) Caps capsule Commonly known as: DRISDOL Take 1 capsule (50,000 Units total) by mouth every 7 (seven) days. Started by: Claretta Fraise, MD         Follow-up: Return in about 3 months (around 03/06/2022) for hypertension, cholesterol, afib.  Claretta Fraise, M.D.

## 2021-12-05 ENCOUNTER — Encounter: Payer: Self-pay | Admitting: Family Medicine

## 2021-12-05 LAB — COVID-19, FLU A+B AND RSV
Influenza A, NAA: NOT DETECTED
Influenza B, NAA: NOT DETECTED
RSV, NAA: NOT DETECTED
SARS-CoV-2, NAA: NOT DETECTED

## 2021-12-05 NOTE — Progress Notes (Signed)
Hello Rayson, ° °Your lab result is normal and/or stable.Some minor variations that are not significant are commonly marked abnormal, but do not represent any medical problem for you. ° °Best regards, °Lourine Alberico, M.D.

## 2021-12-07 NOTE — Telephone Encounter (Signed)
It is not unusual to need multiple medications to treat blood pressure. Remember he goal is to reduce the resting BP to 130/80 or less. The number of medications is based on reaching that goal.

## 2022-01-22 DIAGNOSIS — C44329 Squamous cell carcinoma of skin of other parts of face: Secondary | ICD-10-CM | POA: Diagnosis not present

## 2022-01-22 DIAGNOSIS — C44319 Basal cell carcinoma of skin of other parts of face: Secondary | ICD-10-CM | POA: Diagnosis not present

## 2022-01-22 DIAGNOSIS — L570 Actinic keratosis: Secondary | ICD-10-CM | POA: Diagnosis not present

## 2022-03-13 ENCOUNTER — Other Ambulatory Visit: Payer: Self-pay | Admitting: Family Medicine

## 2022-03-13 DIAGNOSIS — E782 Mixed hyperlipidemia: Secondary | ICD-10-CM

## 2022-03-13 DIAGNOSIS — I10 Essential (primary) hypertension: Secondary | ICD-10-CM

## 2022-03-30 ENCOUNTER — Ambulatory Visit (INDEPENDENT_AMBULATORY_CARE_PROVIDER_SITE_OTHER): Payer: Medicare Other

## 2022-03-30 ENCOUNTER — Ambulatory Visit (INDEPENDENT_AMBULATORY_CARE_PROVIDER_SITE_OTHER): Payer: Medicare Other | Admitting: Family

## 2022-03-30 ENCOUNTER — Encounter: Payer: Self-pay | Admitting: Family

## 2022-03-30 VITALS — BP 144/61 | HR 65 | Temp 97.0°F | Ht 73.0 in | Wt 249.8 lb

## 2022-03-30 DIAGNOSIS — S91105A Unspecified open wound of left lesser toe(s) without damage to nail, initial encounter: Secondary | ICD-10-CM

## 2022-03-30 DIAGNOSIS — L03116 Cellulitis of left lower limb: Secondary | ICD-10-CM

## 2022-03-30 DIAGNOSIS — I878 Other specified disorders of veins: Secondary | ICD-10-CM | POA: Diagnosis not present

## 2022-03-30 DIAGNOSIS — S81812A Laceration without foreign body, left lower leg, initial encounter: Secondary | ICD-10-CM | POA: Diagnosis not present

## 2022-03-30 DIAGNOSIS — I10 Essential (primary) hypertension: Secondary | ICD-10-CM | POA: Diagnosis not present

## 2022-03-30 DIAGNOSIS — S91302A Unspecified open wound, left foot, initial encounter: Secondary | ICD-10-CM | POA: Diagnosis not present

## 2022-03-30 MED ORDER — DOXYCYCLINE HYCLATE 100 MG PO TABS: 100.0000 mg | ORAL_TABLET | Freq: Two times a day (BID) | ORAL | 0 refills | Status: AC

## 2022-03-30 MED ORDER — FUROSEMIDE 20 MG PO TABS: ORAL_TABLET | ORAL | 1 refills | Status: DC

## 2022-03-30 NOTE — Progress Notes (Addendum)
Subjective:    Patient ID: [name], male    DOB: 1929/02/28, 89 y.o.   MRN: [id number]  Chief Complaint  Patient presents with   Cellulitis   PT presents to the office today with left cellulitis. He reports he got up in the middle of the night and "scraped" his leg against his bedpost. His left leg is erythemas and mild heat. States the swelling improves in the AM. Denies any pain or fever.  Hypertension This is a chronic problem. The current episode started more than 1 year ago. The problem has been waxing and waning since onset. The problem is uncontrolled. Associated symptoms include malaise/fatigue and peripheral edema.      Review of Systems  Constitutional:  Positive for malaise/fatigue.  All other systems reviewed and are negative.  Family History  Problem Relation Age of Onset   Cancer Mother    Stroke Father    Dementia Brother    Healthy Daughter    Healthy Son    Healthy Son    Social History   Socioeconomic History   Marital status: Divorced    Spouse name: Not on file   Number of children: 3   Years of education: 11   Highest education level: 11th grade  Occupational History   Not on file  Tobacco Use   Smoking status: Never    Passive exposure: Never   Smokeless tobacco: Never   Tobacco comments:    Verified by Daughter, [name]  Vaping Use   Vaping Use: Never used  Substance and Sexual Activity   Alcohol use: No   Drug use: No   Sexual activity: Not Currently    Partners: Female  Other Topics Concern   Not on file  Social History Narrative   Not on file   Social Determinants of Health   Financial Resource Strain: Low Risk  (11/20/2021)   Overall Financial Resource Strain (CARDIA)    Difficulty of Paying Living Expenses: Not hard at all  Food Insecurity: No Food Insecurity (11/20/2021)   Hunger Vital Sign    Worried About [place] in the Last Year: Never true    [place] in the Last Year: Never true   Transportation Needs: No Transportation Needs (11/20/2021)   PRAPARE - Hydrologist (Medical): No    Lack of Transportation (Non-Medical): No  Physical Activity: Sufficiently Active (11/20/2021)   Exercise Vital Sign    Days of Exercise per Week: 5 days    Minutes of Exercise per Session: 60 min  Stress: No Stress Concern Present (11/20/2021)   [place]    Feeling of Stress : Not at all  Social Connections: Moderately Integrated (11/20/2021)   Social Connection and Isolation Panel [NHANES]    Frequency of Communication with Friends and Family: More than three times a week    Frequency of Social Gatherings with Friends and Family: More than three times a week    Attends Religious Services: More than 4 times per year    Active Member of [employer] or Organizations: Yes    Attends Music therapist: More than 4 times per year    Marital Status: Divorced        Objective:   Physical Exam Vitals reviewed.  Constitutional:      General: He is not in acute distress.    Appearance: He is well-developed.  HENT:  Head: Normocephalic.  Eyes:     General:        Right eye: No discharge.        Left eye: No discharge.     Pupils: Pupils are equal, round, and reactive to light.  Neck:     Thyroid: No thyromegaly.  Cardiovascular:     Rate and Rhythm: Normal rate and regular rhythm.     Heart sounds: Normal heart sounds. No murmur heard. Pulmonary:     Effort: Pulmonary effort is normal. No respiratory distress.     Breath sounds: Normal breath sounds. No wheezing.  Abdominal:     General: Bowel sounds are normal. There is no distension.     Palpations: Abdomen is soft.     Tenderness: There is no abdominal tenderness.  Musculoskeletal:        General: No tenderness. Normal range of motion.     Cervical back: Normal range of motion and neck supple.  Skin:    General: Skin  is warm and dry.     Findings: No erythema or rash.          Comments: Skin tear approx 8X3 cm, second toe swelling, odor and purulent discharge  Neurological:     Mental Status: He is alert and oriented to person, place, and time.     Cranial Nerves: No cranial nerve deficit.     Deep Tendon Reflexes: Reflexes are normal and symmetric.  Psychiatric:        Behavior: Behavior normal.        Thought Content: Thought content normal.        Judgment: Judgment normal.    Bilateral unna boots applied. Pt tolerated well.    BP (!) 144/61   Pulse 65   Temp (!) 97 F (36.1 C) (Temporal)   Ht '6\' 1"'$  (1.854 m)   Wt 249 lb 12.8 oz (113.3 kg)   SpO2 99%   BMI 32.96 kg/m      Assessment & Plan:  [name] comes in today with chief complaint of Cellulitis   Diagnosis and orders addressed:  1. Cellulitis of left lower extremity - furosemide (LASIX) 20 MG tablet; 40 mg for three days, then 20 mg daily  Dispense: 90 tablet; Refill: 1  2. Open wound of second toe of left foot, initial encounter - DG Toe 2nd Left; Future  3. ISTAP type 3 skin tear of left lower leg  4. Venous stasis of lower extremity - furosemide (LASIX) 20 MG tablet; 40 mg for three days, then 20 mg daily  Dispense: 90 tablet; Refill: 1  5. Essential hypertension - furosemide (LASIX) 20 MG tablet; 40 mg for three days, then 20 mg daily  Dispense: 90 tablet; Refill: 1  Will increase Lasix to 40 mg for three days Unna boot applied  Dressing of second toe Negative x-ray Called and spoke with son about increase lasix and starting doxycyline. Pt lives at home by himself, but son does his medications for the week. Discussed to call with fevers, increased redness, pain, swelling, or discharge.  Approx 49 mins spent with patient doing wound changes, calling son, chart review, and patient education.    Evelina Dun, FNP    Evelina Dun, [facility]

## 2022-03-30 NOTE — Patient Instructions (Signed)

## 2022-04-05 ENCOUNTER — Ambulatory Visit: Payer: Medicare Other | Admitting: Family Medicine

## 2022-04-11 ENCOUNTER — Encounter: Payer: Self-pay | Admitting: Family Medicine

## 2022-04-13 ENCOUNTER — Ambulatory Visit (INDEPENDENT_AMBULATORY_CARE_PROVIDER_SITE_OTHER): Payer: Medicare Other | Admitting: Family Medicine

## 2022-04-13 ENCOUNTER — Encounter: Payer: Self-pay | Admitting: Family Medicine

## 2022-04-13 VITALS — BP 135/67 | HR 69 | Ht 73.0 in | Wt 250.0 lb

## 2022-04-13 DIAGNOSIS — R609 Edema, unspecified: Secondary | ICD-10-CM

## 2022-04-13 NOTE — Progress Notes (Unsigned)
BP 135/67   Pulse 69   Ht '6\' 1"'$  (1.854 m)   Wt 250 lb (113.4 kg)   SpO2 98%   BMI 32.98 kg/m    Subjective:   Patient ID: [name], male    DOB: 03/30/1929, 89 y.o.   MRN: [id number]  HPI: [name] is a 89 y.o. male presenting on 04/13/2022 for Edema (BLE)   HPI Patient is coming in today for peripheral edema and swelling that has been worsening.  He is trying to do compression at home but his legs have been more swollen and painful.  He is not getting any wounds or weeping that he knows of.  He denies any fevers or chills or redness or warmth.  Relevant past medical, surgical, family and social history reviewed and updated as indicated. Interim medical history since our last visit reviewed. Allergies and medications reviewed and updated.  Review of Systems  Constitutional:  Negative for chills and fever.  Eyes:  Negative for visual disturbance.  Respiratory:  Negative for shortness of breath and wheezing.   Cardiovascular:  Positive for leg swelling. Negative for chest pain and palpitations.  Musculoskeletal:  Negative for back pain and gait problem.  Skin:  Negative for rash.  All other systems reviewed and are negative.   Per HPI unless specifically indicated above   Allergies as of 04/13/2022   No Known Allergies      Medication List        Accurate as of April 13, 2022  1:12 PM. If you have any questions, ask your nurse or doctor.          amLODipine 10 MG tablet Commonly known as: NORVASC Take 1 tablet (10 mg total) by mouth daily.   apixaban 5 MG Tabs tablet Commonly known as: ELIQUIS Take 1 tablet (5 mg total) by mouth 2 (two) times daily.   doxycycline 100 MG tablet Commonly known as: VIBRA-TABS Take 1 tablet (100 mg total) by mouth 2 (two) times daily.   fish oil-omega-3 fatty acids 1000 MG capsule Take 2 g by mouth daily.   furosemide 20 MG tablet Commonly known as: LASIX 40 mg for three days, then 20 mg daily   hydrocortisone 1  % lotion Apply 1 Application topically 2 (two) times daily.   lisinopril 40 MG tablet Commonly known as: ZESTRIL TAKE ONE TABLET ONCE DAILY   metoprolol succinate 25 MG 24 hr tablet Commonly known as: TOPROL-XL Take 1 tablet (25 mg total) by mouth daily.   omeprazole 20 MG capsule Commonly known as: PRILOSEC Take 1 capsule (20 mg total) by mouth daily.   polyethylene glycol 17 g packet Commonly known as: MIRALAX / GLYCOLAX Take 17 g by mouth daily as needed for mild constipation.   rosuvastatin 5 MG tablet Commonly known as: CRESTOR TAKE 1/2 TABLET ON MONDAY, WEDNESDAY AND FRIDAY AT 6PM   triamcinolone ointment 0.1 % Commonly known as: KENALOG Apply 1 application topically 2 (two) times daily.   Vitamin D (Ergocalciferol) 1.25 MG (50000 UNIT) Caps capsule Commonly known as: DRISDOL Take 1 capsule (50,000 Units total) by mouth every 7 (seven) days.         Objective:   BP 135/67   Pulse 69   Ht '6\' 1"'$  (1.854 m)   Wt 250 lb (113.4 kg)   SpO2 98%   BMI 32.98 kg/m   Wt Readings from Last 3 Encounters:  04/13/22 250 lb (113.4 kg)  03/30/22 249 lb 12.8 oz (113.3 kg)  12/04/21 238 lb 12.8 oz (108.3 kg)    Physical Exam Vitals and nursing note reviewed.  Constitutional:      General: He is not in acute distress.    Appearance: He is well-developed. He is not diaphoretic.  Eyes:     General: No scleral icterus.    Conjunctiva/sclera: Conjunctivae normal.  Neck:     Thyroid: No thyromegaly.  Cardiovascular:     Rate and Rhythm: Normal rate and regular rhythm.     Heart sounds: Normal heart sounds. No murmur heard. Pulmonary:     Effort: Pulmonary effort is normal. No respiratory distress.     Breath sounds: Normal breath sounds. No wheezing.  Musculoskeletal:        General: Swelling (3+ peripheral edema) present. Normal range of motion.     Cervical back: Neck supple.  Lymphadenopathy:     Cervical: No cervical adenopathy.  Skin:    General: Skin is warm  and dry.     Findings: No rash.  Neurological:     Mental Status: He is alert and oriented to person, place, and time.     Coordination: Coordination normal.  Psychiatric:        Behavior: Behavior normal.     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  Assessment & Plan:   Problem List Items Addressed This Visit       Other   Peripheral edema - Primary    Scaling and minor breakdown of skin but no open wounds or weeping.  Will do Unna boots on both legs and wrap up to the knee and have him come back in 5 to 6 days Continue Lasix. Follow up plan: Return if symptoms worsen or fail to improve, for 5 to 7-day recheck Unna boot and edema.  Counseling provided for all of the vaccine components No orders of the defined types were placed in this encounter.   Caryl Pina, MD [facility] 04/13/2022, 1:12 PM

## 2022-04-18 ENCOUNTER — Ambulatory Visit (INDEPENDENT_AMBULATORY_CARE_PROVIDER_SITE_OTHER): Payer: Medicare Other | Admitting: Family Medicine

## 2022-04-18 ENCOUNTER — Encounter: Payer: Self-pay | Admitting: Family Medicine

## 2022-04-18 VITALS — HR 50 | Temp 97.8°F | Ht 73.0 in | Wt 249.8 lb

## 2022-04-18 DIAGNOSIS — I872 Venous insufficiency (chronic) (peripheral): Secondary | ICD-10-CM | POA: Diagnosis not present

## 2022-04-18 DIAGNOSIS — L97929 Non-pressure chronic ulcer of unspecified part of left lower leg with unspecified severity: Secondary | ICD-10-CM

## 2022-04-18 NOTE — Progress Notes (Signed)
Chief Complaint  Patient presents with   Edema    BILATERAL LOWER LEG    HPI  Patient presents today for sores on the legs that were treated with unna boots last week. Here to day for removal and assessment of healing.   PMH: Smoking status noted ROS: Per HPI  Objective: Pulse (!) 50   Temp 97.8 F (36.6 C)   Ht 6\' 1"  (1.854 m)   Wt 249 lb 12.8 oz (113.3 kg)   SpO2 100%   BMI 32.96 kg/m  Gen: NAD, alert, cooperative with exam HEENT: NCAT, Ext: Decreased edema, warm.Unna boots have been removed and lesions at the LLE mid-shin are still open. The RLE Lesions have closed up. Mild hyperemai on the right. Moderate edema L>R Neuro: Alert and oriented, No gross deficits  Assessment and plan:  1. Venous stasis ulcer of leg without varicose veins (HCC)     Wound care reviewed. Unna boot applied to LLE.     Follow up1 week  Claretta Fraise, MD

## 2022-04-22 ENCOUNTER — Encounter: Payer: Self-pay | Admitting: Family Medicine

## 2022-04-24 ENCOUNTER — Ambulatory Visit (INDEPENDENT_AMBULATORY_CARE_PROVIDER_SITE_OTHER): Payer: Medicare Other | Admitting: Family Medicine

## 2022-04-24 ENCOUNTER — Encounter: Payer: Self-pay | Admitting: Family Medicine

## 2022-04-24 VITALS — BP 169/84 | HR 50 | Temp 97.1°F | Ht 73.0 in | Wt 251.4 lb

## 2022-04-24 DIAGNOSIS — I878 Other specified disorders of veins: Secondary | ICD-10-CM

## 2022-04-24 DIAGNOSIS — I872 Venous insufficiency (chronic) (peripheral): Secondary | ICD-10-CM

## 2022-04-24 NOTE — Progress Notes (Signed)
Chief Complaint  Patient presents with   Edema    Left lower extremity    HPI  Patient presents today for recheck of stasis ulcers. Had the left leg wrapped in unna boot. Right healing well.  PMH: Smoking status noted ROS: Per HPI  Objective: BP (!) 169/84   Pulse (!) 50   Temp (!) 97.1 F (36.2 C)   Ht 6\' 1"  (1.854 m)   Wt 251 lb 6.4 oz (114 kg)   SpO2 98%   BMI 33.17 kg/m  Gen: NAD, alert, cooperative with exam [id number] leg shows only one area of stage one ulcer, mid anterior left leg. No slough. Some scaly residue superiorly medially.  Neuro: Alert and oriented, No gross deficits  Assessment and plan:  1. Venous stasis of lower extremity   2. Chronic venous insufficiency     No orders of the defined types were placed in this encounter.   No orders of the defined types were placed in this encounter.   Follow up as needed.  Claretta Fraise, MD

## 2022-04-26 ENCOUNTER — Ambulatory Visit: Payer: Medicare Other | Admitting: Family Medicine

## 2022-04-26 ENCOUNTER — Encounter: Payer: Self-pay | Admitting: Family Medicine

## 2022-04-27 ENCOUNTER — Ambulatory Visit (INDEPENDENT_AMBULATORY_CARE_PROVIDER_SITE_OTHER): Payer: Medicare Other | Admitting: Nurse Practitioner

## 2022-04-27 ENCOUNTER — Encounter: Payer: Self-pay | Admitting: Nurse Practitioner

## 2022-04-27 VITALS — BP 176/98 | HR 52 | Temp 97.2°F | Resp 20 | Ht 73.0 in | Wt 252.0 lb

## 2022-04-27 DIAGNOSIS — I878 Other specified disorders of veins: Secondary | ICD-10-CM | POA: Diagnosis not present

## 2022-04-27 MED ORDER — MEDICAL COMPRESSION STOCKINGS MISC: 1.0000 | Freq: Every day | 1 refills | Status: AC

## 2022-04-27 MED ORDER — MEDICAL COMPRESSION STOCKINGS MISC: 1.0000 | Freq: Every day | 1 refills | Status: DC

## 2022-04-27 NOTE — Patient Instructions (Signed)
Chronic Venous Insufficiency Chronic venous insufficiency is a condition where the leg veins cannot effectively pump blood from the legs to the heart. This happens when the vein walls are either stretched, weakened, or damaged, or when the valves inside the vein are damaged. With the right treatment, you should be able to continue with an active life. This condition is also called venous stasis. What are the causes? Common causes of this condition include: High blood pressure inside the veins (venous hypertension). Sitting or standing too long, causing increased blood pressure in the leg veins. A blood clot that blocks blood flow in a vein (deep vein thrombosis, DVT). Inflammation of a vein (phlebitis) that causes a blood clot to form. Tumors in the pelvis that cause blood to back up. What increases the risk? The following factors may make you more likely to develop this condition: Having a family history of this condition. Obesity. Pregnancy. Living without enough regular physical activity or exercise (sedentary lifestyle). Smoking. Having a job that requires long periods of standing or sitting in one place. Being a certain age. Women in their 40s and 50s and men in their 70s are more likely to develop this condition. What are the signs or symptoms? Symptoms of this condition include: Veins that are enlarged, bulging, or twisted (varicose veins). Skin breakdown or ulcers. Reddened skin or dark discoloration of skin on the leg between the knee and ankle. Brown, smooth, tight, and painful skin just above the ankle, usually on the inside of the leg (lipodermatosclerosis). Swelling of the legs. How is this diagnosed? This condition may be diagnosed based on: Your medical history. A physical exam. Tests, such as: A procedure that creates an image of a blood vessel and nearby organs and provides information about blood flow through the blood vessel (duplex ultrasound). A procedure that  tests blood flow (plethysmography). A procedure that looks at the veins using X-ray and dye (venogram). How is this treated? The goals of treatment are to help you return to an active life and to minimize pain or disability. Treatment depends on the severity of your condition, and it may include: Wearing compression stockings. These can help relieve symptoms and help prevent your condition from getting worse. However, they do not cure the condition. Sclerotherapy. This procedure involves an injection of a solution that shrinks damaged veins. Surgery. This may involve: Removing a diseased vein (vein stripping). Cutting off blood flow through the vein (laser ablation surgery). Repairing or reconstructing a valve within the affected vein. Follow these instructions at home:     Wear compression stockings as told by your health care provider. These stockings help to prevent blood clots and reduce swelling in your legs. Take over-the-counter and prescription medicines only as told by your health care provider. Stay active by exercising, walking, or doing different activities. Ask your health care provider what activities are safe for you and how much exercise you need. Drink enough fluid to keep your urine pale yellow. Do not use any products that contain nicotine or tobacco, such as cigarettes, e-cigarettes, and chewing tobacco. If you need help quitting, ask your health care provider. Keep all follow-up visits as told by your health care provider. This is important. Contact a health care provider if you: Have redness, swelling, or more pain in the affected area. See a red streak or line that goes up or down from the affected area. Have skin breakdown or skin loss in the affected area, even if the breakdown is small. Get   an injury in the affected area. Get help right away if: You get an injury and an open wound in the affected area. You have: Severe pain that does not get better with  medicine. Sudden numbness or weakness in the foot or ankle below the affected area. Trouble moving your foot or ankle. A fever. Worse or persistent symptoms. Chest pain. Shortness of breath. Summary Chronic venous insufficiency is a condition where the leg veins cannot effectively pump blood from the legs to the heart. Chronic venous insufficiency occurs when the vein walls become stretched, weakened, or damaged, or when valves within the vein are damaged. Treatment depends on how severe your condition is. It often involves wearing compression stockings and may involve having a procedure. Make sure you stay active by exercising, walking, or doing different activities. Ask your health care provider what activities are safe for you and how much exercise you need. This information is not intended to replace advice given to you by your health care provider. Make sure you discuss any questions you have with your health care provider. Document Revised: 04/04/2020 Document Reviewed: 04/05/2020 Elsevier Patient Education  2023 Elsevier Inc.  

## 2022-04-27 NOTE — Progress Notes (Signed)
   Subjective:    Patient ID: [name], male    DOB: 10-18-1929, 89 y.o.   MRN: [id number]   Chief Complaint: recheck legs  HPI Patient was seen on 04/18/22 with venous stasis . Unna boots were applied. He followed up with his PCP on 04/24/22 . He is here  today for recheck. He has TED hose on. Patient Active Problem List   Diagnosis Date Noted   Chronic venous insufficiency 07/31/2021   Atrial fibrillation ([place]) 04/28/2020   Venous stasis of lower extremity 10/23/2019   Obesity (BMI 30.0-34.9) 07/18/2017   Essential hypertension 08/16/2014   Hyperlipemia 08/16/2014   Peripheral edema 06/10/2012   Vitamin D deficiency 05/10/2010   Arthritis 05/10/2010       Review of Systems  Constitutional:  Negative for diaphoresis.  Eyes:  Negative for pain.  Respiratory:  Negative for shortness of breath.   Cardiovascular:  Negative for chest pain, palpitations and leg swelling.  Gastrointestinal:  Negative for abdominal pain.  Endocrine: Negative for polydipsia.  Skin:  Negative for rash.  Neurological:  Negative for dizziness, weakness and headaches.  Hematological:  Does not bruise/bleed easily.  All other systems reviewed and are negative.      Objective:   Physical Exam Vitals reviewed.  Constitutional:      Appearance: Normal appearance.  Cardiovascular:     Rate and Rhythm: Normal rate and regular rhythm.     Heart sounds: Normal heart sounds.  Pulmonary:     Effort: Pulmonary effort is normal.     Breath sounds: Normal breath sounds.  Musculoskeletal:     Right lower leg: Edema (1+ half way up lower leg to knee) present.     Left lower leg: Edema (1+ half way up lower leg and up to knee) present.  Neurological:     General: No focal deficit present.     Mental Status: He is alert and oriented to person, place, and time.  Psychiatric:        Mood and Affect: Mood normal.        Behavior: Behavior normal.    BP (!) 176/98   Pulse (!) 52   Temp (!) 97.2 F  (36.2 C) (Temporal)   Resp 20   Ht 6\' 1"  (1.854 m)   Wt 252 lb (114.3 kg)   SpO2 100%   BMI 33.25 kg/m         Assessment & Plan:   Elli Hudek in today with chief complaint of No chief complaint on file.   1. Venous stasis of lower extremity Elevate legs when sitting Take current hose off and get some that actually fit  Compression socks ordered     The above assessment and management plan was discussed with the patient. The patient verbalized understanding of and has agreed to the management plan. Patient is aware to call the clinic if symptoms persist or worsen. Patient is aware when to return to the clinic for a follow-up visit. Patient educated on when it is appropriate to go to the emergency department.   Mary-Margaret Hassell Done, FNP

## 2022-04-27 NOTE — Addendum Note (Signed)
Addended by: Chevis Pretty on: 04/27/2022 03:19 PM   Modules accepted: Orders

## 2022-05-08 ENCOUNTER — Telehealth: Payer: Self-pay

## 2022-05-08 NOTE — Telephone Encounter (Signed)
Called and spoke with daughter. Made appt for this Thursday with PCP

## 2022-05-08 NOTE — Telephone Encounter (Signed)
Needs labs before increasing lasix- just keep leg elevated when can and follow up with PCP- Dr. Livia Snellen

## 2022-05-08 NOTE — Telephone Encounter (Signed)
Patient is concerned because he is still having swelling in his left lower leg.  He is elevating leg and it goes down at night but the swelling worsens during the day.  He is taking Furosemide 20 mg daily, do you recommend he increase the dose and see if this will help?  The leg is not red or inflammed, only swollen.  Patient was last seen by you on 04/27/22.

## 2022-05-10 ENCOUNTER — Ambulatory Visit (INDEPENDENT_AMBULATORY_CARE_PROVIDER_SITE_OTHER): Payer: Medicare Other | Admitting: Family Medicine

## 2022-05-10 ENCOUNTER — Encounter: Payer: Self-pay | Admitting: Family Medicine

## 2022-05-10 DIAGNOSIS — I878 Other specified disorders of veins: Secondary | ICD-10-CM | POA: Diagnosis not present

## 2022-05-10 DIAGNOSIS — L03116 Cellulitis of left lower limb: Secondary | ICD-10-CM

## 2022-05-10 DIAGNOSIS — I10 Essential (primary) hypertension: Secondary | ICD-10-CM | POA: Diagnosis not present

## 2022-05-10 MED ORDER — FUROSEMIDE 40 MG PO TABS: 40.0000 mg | ORAL_TABLET | Freq: Every morning | ORAL | 2 refills | Status: DC

## 2022-05-10 NOTE — Progress Notes (Addendum)
Subjective:  Patient ID: [name], male    DOB: Apr 18, 1929  Age: 89 y.o. MRN: [id number]  CC: Edema   HPI [name] presents for legs swelling a lot more. Elevating, bbut not to level of heart. Fluid mostly goes down at night, comes ack quickly when up. Not painful. No dyspnea associated.      05/10/2022    2:16 PM 04/27/2022    3:07 PM 04/24/2022    4:28 PM  Depression screen PHQ 2/9  Decreased Interest 0 0 0  Down, Depressed, Hopeless 0 0 0  PHQ - 2 Score 0 0 0  Altered sleeping  0   Tired, decreased energy  0   Change in appetite  0   Feeling bad or failure about yourself   0   Trouble concentrating  0   Moving slowly or fidgety/restless  0   Suicidal thoughts  0   PHQ-9 Score  0   Difficult doing work/chores  Not difficult at all     History [name] has a past medical history of Arthritis, Atrial fibrillation (04/28/2020), Hyperlipidemia, Hypertension, and Skin cancer.   [name] has a past surgical history that includes Joint replacement (Left); Appendectomy; Skin lesion excision; and Total hip arthroplasty (Right, 05/11/2020).   His family history includes Cancer in his mother; Dementia in his brother; Healthy in his daughter, son, and son; Stroke in his father.[name] reports that [name] has never smoked. [name] has never been exposed to tobacco smoke. [name] has never used smokeless tobacco. [name] reports that [name] does not drink alcohol and does not use drugs.    ROS Review of Systems  Constitutional:  Negative for fever.  Respiratory:  Negative for shortness of breath.   Cardiovascular:  Negative for chest pain.  Musculoskeletal:  Negative for arthralgias.  Skin:  Negative for rash.    Objective:  BP 112/64   Pulse 88   Temp (!) 97.5 F (36.4 C)   Ht 6\' 1"  (1.854 m)   Wt 250 lb 3.2 oz (113.5 kg)   SpO2 100%   BMI 33.01 kg/m   BP Readings from Last 3 Encounters:  05/10/22 112/64  04/27/22 (!) 176/98  04/24/22 (!) 169/84    Wt Readings from Last 3 Encounters:   05/10/22 250 lb 3.2 oz (113.5 kg)  04/27/22 252 lb (114.3 kg)  04/24/22 251 lb 6.4 oz (114 kg)     Physical Exam Vitals reviewed.  Constitutional:      Appearance: [name] is well-developed.  HENT:     Head: Normocephalic and atraumatic.     Right Ear: External ear normal.     Left Ear: External ear normal.     Mouth/Throat:     Pharynx: No oropharyngeal exudate or posterior oropharyngeal erythema.  Eyes:     Pupils: Pupils are equal, round, and reactive to light.  Cardiovascular:     Rate and Rhythm: Normal rate and regular rhythm.     Heart sounds: No murmur heard. Pulmonary:     Effort: No respiratory distress.     Breath sounds: Normal breath sounds.  Musculoskeletal:     Cervical back: Normal range of motion and neck supple.     Right lower leg: Edema (4+) present.     Left lower leg: Edema (4+) present.  Skin:    General: Skin is warm and dry.     Comments: Scaly, thick, yellow at anterior legs.   Neurological:     Mental Status: [name] is alert and  oriented to person, place, and time.       Assessment & Plan:   [name] was seen today for edema.  Diagnoses and all orders for this visit:  Venous stasis of lower extremity -     Discontinue: furosemide (LASIX) 40 MG tablet; Take 1 tablet (40 mg total) by mouth every morning. 40 mg for three days, then 20 mg daily -     Compression stockings -     furosemide (LASIX) 40 MG tablet; Take 1 tablet (40 mg total) by mouth every morning.  Cellulitis of left lower extremity -     Discontinue: furosemide (LASIX) 40 MG tablet; Take 1 tablet (40 mg total) by mouth every morning. 40 mg for three days, then 20 mg daily -     furosemide (LASIX) 40 MG tablet; Take 1 tablet (40 mg total) by mouth every morning.  Essential hypertension -     Discontinue: furosemide (LASIX) 40 MG tablet; Take 1 tablet (40 mg total) by mouth every morning. 40 mg for three days, then 20 mg daily -     furosemide (LASIX) 40 MG tablet; Take 1 tablet (40 mg  total) by mouth every morning.   Elevate so that ankles are above the heart.     I have discontinued [name]'s furosemide. I have also changed his furosemide. Additionally, I am having him maintain his fish oil-omega-3 fatty acids, polyethylene glycol, apixaban, triamcinolone ointment, omeprazole, amLODipine, hydrocortisone, Vitamin D (Ergocalciferol), metoprolol succinate, lisinopril, rosuvastatin, doxycycline, and Medical Compression Stockings.  Allergies as of 05/10/2022   No Known Allergies      Medication List        Accurate as of May 10, 2022  3:09 PM. If you have any questions, ask your nurse or doctor.          amLODipine 10 MG tablet Commonly known as: NORVASC Take 1 tablet (10 mg total) by mouth daily.   apixaban 5 MG Tabs tablet Commonly known as: ELIQUIS Take 1 tablet (5 mg total) by mouth 2 (two) times daily.   doxycycline 100 MG tablet Commonly known as: VIBRA-TABS Take 1 tablet (100 mg total) by mouth 2 (two) times daily.   fish oil-omega-3 fatty acids 1000 MG capsule Take 2 g by mouth daily.   furosemide 40 MG tablet Commonly known as: LASIX Take 1 tablet (40 mg total) by mouth every morning. What changed:  medication strength how much to take how to take this when to take this additional instructions Changed by: Claretta Fraise, MD   hydrocortisone 1 % lotion Apply 1 Application topically 2 (two) times daily.   lisinopril 40 MG tablet Commonly known as: ZESTRIL TAKE ONE TABLET ONCE DAILY   Medical Compression Stockings Misc 1 each by Does not apply route daily.   metoprolol succinate 25 MG 24 hr tablet Commonly known as: TOPROL-XL Take 1 tablet (25 mg total) by mouth daily.   omeprazole 20 MG capsule Commonly known as: PRILOSEC Take 1 capsule (20 mg total) by mouth daily.   polyethylene glycol 17 g packet Commonly known as: MIRALAX / GLYCOLAX Take 17 g by mouth daily as needed for mild constipation.   rosuvastatin 5 MG  tablet Commonly known as: CRESTOR TAKE 1/2 TABLET ON MONDAY, WEDNESDAY AND FRIDAY AT 6PM   triamcinolone ointment 0.1 % Commonly known as: KENALOG Apply 1 application topically 2 (two) times daily.   Vitamin D (Ergocalciferol) 1.25 MG (50000 UNIT) Caps capsule Commonly known as: DRISDOL Take 1 capsule (50,000  Units total) by mouth every 7 (seven) days.         Follow-up: Return in about 2 weeks (around 05/24/2022) for edema, potassium.  Claretta Fraise, M.D.

## 2022-05-10 NOTE — Addendum Note (Signed)
Addended by: Claretta Fraise on: 05/10/2022 03:09 PM   Modules accepted: Orders

## 2022-05-14 ENCOUNTER — Other Ambulatory Visit: Payer: Self-pay | Admitting: Family Medicine

## 2022-05-14 DIAGNOSIS — E782 Mixed hyperlipidemia: Secondary | ICD-10-CM

## 2022-05-28 ENCOUNTER — Ambulatory Visit (INDEPENDENT_AMBULATORY_CARE_PROVIDER_SITE_OTHER): Payer: Medicare Other | Admitting: Family Medicine

## 2022-05-28 ENCOUNTER — Encounter: Payer: Self-pay | Admitting: Family Medicine

## 2022-05-28 VITALS — BP 142/60 | HR 47 | Temp 97.8°F | Ht 73.0 in | Wt 248.6 lb

## 2022-05-28 DIAGNOSIS — I878 Other specified disorders of veins: Secondary | ICD-10-CM

## 2022-05-28 NOTE — Progress Notes (Signed)
Subjective:  Patient ID: [name], male    DOB: 22-Jun-1929  Age: 89 y.o. MRN: [id number]  CC: Follow-up   HPI [name] presents for recheck of the sores on his left leg. Wearing bandaids over two small open areas. Waearing compression hose to upper third of the leg.      05/28/2022    1:04 PM 05/10/2022    2:16 PM 04/27/2022    3:07 PM  Depression screen PHQ 2/9  Decreased Interest 0 0 0  Down, Depressed, Hopeless 0 0 0  PHQ - 2 Score 0 0 0  Altered sleeping   0  Tired, decreased energy   0  Change in appetite   0  Feeling bad or failure about yourself    0  Trouble concentrating   0  Moving slowly or fidgety/restless   0  Suicidal thoughts   0  PHQ-9 Score   0  Difficult doing work/chores   Not difficult at all    History [name] has a past medical history of Arthritis, Atrial fibrillation (04/28/2020), Hyperlipidemia, Hypertension, and Skin cancer.   He has a past surgical history that includes Joint replacement (Left); Appendectomy; Skin lesion excision; and Total hip arthroplasty (Right, 05/11/2020).   His family history includes Cancer in his mother; Dementia in his brother; Healthy in his daughter, son, and son; Stroke in his father.He reports that he has never smoked. He has never been exposed to tobacco smoke. He has never used smokeless tobacco. He reports that he does not drink alcohol and does not use drugs.    ROS Review of Systems  Constitutional:  Negative for fever.  Respiratory:  Negative for shortness of breath.   Cardiovascular:  Positive for leg swelling. Negative for chest pain.  Musculoskeletal:  Negative for arthralgias.  Skin:  Negative for rash.    Objective:  BP (!) 142/60   Pulse (!) 47   Temp 97.8 F (36.6 C)   Ht  (1.854 m)   Wt 248 lb 9.6 oz (112.8 kg)   SpO2 100%   BMI 32.80 kg/m   BP Readings from Last 3 Encounters:  05/28/22 (!) 142/60  05/10/22 112/64  04/27/22 (!) 176/98    Wt Readings from Last 3 Encounters:   05/28/22 248 lb 9.6 oz (112.8 kg)  05/10/22 250 lb 3.2 oz (113.5 kg)  04/27/22 252 lb (114.3 kg)     Physical Exam Vitals reviewed.  Constitutional:      Appearance: He is well-developed.  HENT:     Head: Normocephalic and atraumatic.     Right Ear: External ear normal.     Left Ear: External ear normal.     Mouth/Throat:     Pharynx: No oropharyngeal exudate or posterior oropharyngeal erythema.  Eyes:     Pupils: Pupils are equal, round, and reactive to light.  Cardiovascular:     Rate and Rhythm: Normal rate and regular rhythm.     Heart sounds: No murmur heard. Pulmonary:     Effort: No respiratory distress.     Breath sounds: Normal breath sounds.  Musculoskeletal:        General: Swelling (3+ edema extends from the upper margin of the stockings to the knee, bilaterally.Marland Kitchen) present.     Cervical back: Normal range of motion and neck supple.  Neurological:     Mental Status: He is alert and oriented to person, place, and time.       Assessment & Plan:   [name]  was seen today for follow-up.  Diagnoses and all orders for this visit:  Venous stasis of lower extremity -     CMP14+EGFR       I am having [name] maintain his fish oil-omega-3 fatty acids, polyethylene glycol, apixaban, triamcinolone ointment, omeprazole, amLODipine, hydrocortisone, Vitamin D (Ergocalciferol), metoprolol succinate, lisinopril, doxycycline, Medical Compression Stockings, furosemide, and rosuvastatin.  Allergies as of 05/28/2022   No Known Allergies      Medication List        Accurate as of May 28, 2022  5:59 PM. If you have any questions, ask your nurse or doctor.          amLODipine 10 MG tablet Commonly known as: NORVASC Take 1 tablet (10 mg total) by mouth daily.   apixaban 5 MG Tabs tablet Commonly known as: ELIQUIS Take 1 tablet (5 mg total) by mouth 2 (two) times daily.   doxycycline 100 MG tablet Commonly known as: VIBRA-TABS Take 1 tablet (100 mg  total) by mouth 2 (two) times daily.   fish oil-omega-3 fatty acids 1000 MG capsule Take 2 g by mouth daily.   furosemide 40 MG tablet Commonly known as: LASIX Take 1 tablet (40 mg total) by mouth every morning.   hydrocortisone 1 % lotion Apply 1 Application topically 2 (two) times daily.   lisinopril 40 MG tablet Commonly known as: ZESTRIL TAKE ONE TABLET ONCE DAILY   Medical Compression Stockings Misc 1 each by Does not apply route daily.   metoprolol succinate 25 MG 24 hr tablet Commonly known as: TOPROL-XL Take 1 tablet (25 mg total) by mouth daily.   omeprazole 20 MG capsule Commonly known as: PRILOSEC Take 1 capsule (20 mg total) by mouth daily.   polyethylene glycol 17 g packet Commonly known as: MIRALAX / GLYCOLAX Take 17 g by mouth daily as needed for mild constipation.   rosuvastatin 5 MG tablet Commonly known as: CRESTOR TAKE 1/2 TABLET ON MONDAY, WEDNESDAY AND FRIDAY AT 6PM   triamcinolone ointment 0.1 % Commonly known as: KENALOG Apply 1 application topically 2 (two) times daily.   Vitamin D (Ergocalciferol) 1.25 MG (50000 UNIT) Caps capsule Commonly known as: DRISDOL Take 1 capsule (50,000 Units total) by mouth every 7 (seven) days.       Pt. Needs to get over the knee stockings 20-30 mm compression  Follow-up: No follow-ups on file.  Mechele Claude, M.D.

## 2022-05-29 ENCOUNTER — Other Ambulatory Visit: Payer: Self-pay | Admitting: *Deleted

## 2022-05-29 DIAGNOSIS — E782 Mixed hyperlipidemia: Secondary | ICD-10-CM

## 2022-05-29 DIAGNOSIS — I10 Essential (primary) hypertension: Secondary | ICD-10-CM

## 2022-05-29 MED ORDER — LISINOPRIL 40 MG PO TABS: 40.0000 mg | ORAL_TABLET | Freq: Every day | ORAL | 0 refills | Status: DC

## 2022-05-29 MED ORDER — ROSUVASTATIN CALCIUM 5 MG PO TABS: 2.5000 mg | ORAL_TABLET | ORAL | 0 refills | Status: DC

## 2022-06-13 ENCOUNTER — Telehealth: Payer: Self-pay | Admitting: *Deleted

## 2022-06-13 NOTE — Telephone Encounter (Signed)
Home health nurse Inetta Fermo aware

## 2022-06-13 NOTE — Telephone Encounter (Signed)
TC from [place] w/ [facility] Saw pt today she received referral from [place] to wrap his legs His BP is 170-190/80-99 HR irregular at 40-50 Please advise

## 2022-06-13 NOTE — Telephone Encounter (Signed)
HE is an A fib pt. On rate control so the HR not bothersome. Monitor the BP. May have to treat if it continues that high, but he is on multiple BP meds. I'm afraid if I give him more he will start falling & getting confused.

## 2022-07-03 ENCOUNTER — Other Ambulatory Visit: Payer: Self-pay | Admitting: Family Medicine

## 2022-07-03 DIAGNOSIS — I10 Essential (primary) hypertension: Secondary | ICD-10-CM

## 2022-07-03 DIAGNOSIS — E782 Mixed hyperlipidemia: Secondary | ICD-10-CM

## 2022-07-03 MED ORDER — LISINOPRIL 40 MG PO TABS: 40.0000 mg | ORAL_TABLET | Freq: Every day | ORAL | 0 refills | Status: AC

## 2022-07-03 MED ORDER — ROSUVASTATIN CALCIUM 5 MG PO TABS: 2.5000 mg | ORAL_TABLET | ORAL | 0 refills | Status: DC

## 2022-07-17 ENCOUNTER — Other Ambulatory Visit: Payer: Self-pay | Admitting: Family Medicine

## 2022-07-17 MED ORDER — HYDRALAZINE HCL 25 MG PO TABS: 25.0000 mg | ORAL_TABLET | Freq: Three times a day (TID) | ORAL | 2 refills | Status: DC

## 2022-07-17 MED ORDER — CLONIDINE 0.1 MG/24HR TD PTWK: 0.1000 mg | MEDICATED_PATCH | TRANSDERMAL | 12 refills | Status: AC

## 2022-07-20 ENCOUNTER — Telehealth: Payer: Self-pay | Admitting: Family Medicine

## 2022-07-20 ENCOUNTER — Encounter: Payer: Self-pay | Admitting: Family Medicine

## 2022-07-20 NOTE — Telephone Encounter (Signed)
Looked at last office visit and I do not see why this was prescribed.  Will send to PCP and covering provider to advise.

## 2022-07-23 NOTE — Telephone Encounter (Signed)
I was notified by home health hat the patient was having  multiple high blood pressures. I ordered this and notified the nurse who sent the report of concern. It should be very easy to monitor since it is applied weekly.It is also a very effective medicine for BP control.

## 2022-07-24 NOTE — Telephone Encounter (Signed)
DAUGHTER AWARE

## 2022-07-24 NOTE — Telephone Encounter (Signed)
CALLED PATIENT, NO ANSWER, LEFT MESSAGE TO RETURN CALL 

## 2022-07-31 DIAGNOSIS — L570 Actinic keratosis: Secondary | ICD-10-CM | POA: Diagnosis not present

## 2022-08-11 ENCOUNTER — Other Ambulatory Visit: Payer: Self-pay | Admitting: Family Medicine

## 2022-09-03 ENCOUNTER — Encounter: Payer: Self-pay | Admitting: Nurse Practitioner

## 2022-09-03 ENCOUNTER — Ambulatory Visit: Payer: Medicare Other | Admitting: Nurse Practitioner

## 2022-09-03 VITALS — BP 179/81 | HR 49 | Temp 97.5°F | Ht 73.0 in | Wt 239.0 lb

## 2022-09-03 DIAGNOSIS — H1032 Unspecified acute conjunctivitis, left eye: Secondary | ICD-10-CM

## 2022-09-03 MED ORDER — BACITRACIN-POLYMYXIN B 500-10000 UNIT/GM OP OINT
1.0000 | TOPICAL_OINTMENT | Freq: Two times a day (BID) | OPHTHALMIC | 0 refills | Status: DC
Start: 2022-09-03 — End: 2022-10-03

## 2022-09-03 NOTE — Progress Notes (Signed)
Acute Office Visit  Subjective:     Patient ID: [name], male    DOB: [birth year], 89 y.o.   MRN: [id number]  Chief Complaint  Patient presents with   Eye Pain    Red eye for a couple weeks, does not hurt burn or itch.    HPI [name] is  89 -year-old male, presents today with his son a persistent issue affecting his left eye. He reports that over the past week, his left eye has been consistently red and crusty, particularly noticeable upon waking in the morning. He describes the crustiness as bothersome but manageable throughout the day, accompanied by mild discomfort and a feeling of dryness. He notes that there have been no changes in his vision, nor has he experienced pain with eye movement or increased sensitivity to light. He has attempted to alleviate his symptoms with over-the-counter artificial tears, but has found only minimal relief. His daily activities have not been significantly disrupted, although he is concerned about the persistence of these symptoms. Denies any recent illnesses or allergies to medications, and there is no known history of eye infections.  Active Ambulatory Problems    Diagnosis Date Noted   Vitamin D deficiency 05/10/2010   Arthritis 05/10/2010   Peripheral edema 06/10/2012   Essential hypertension 08/16/2014   Hyperlipemia 08/16/2014   Obesity (BMI 30.0-34.9) 07/18/2017   Venous stasis of lower extremity 10/23/2019   Atrial fibrillation (HCC) 04/28/2020   Chronic venous insufficiency 07/31/2021   Acute bacterial conjunctivitis of left eye 09/03/2022   Resolved Ambulatory Problems    Diagnosis Date Noted   Hypertension 05/10/2010   Preoperative clearance 05/04/2020   OA (osteoarthritis) of hip 05/11/2020   Osteoarthritis of right hip 05/11/2020   Cellulitis of left lower extremity 10/26/2020   Cellulitis of right lower extremity 10/26/2020   Venous stasis ulcer of leg without varicose veins (HCC) 08/02/2021   Past Medical History:   Diagnosis Date   Hyperlipidemia    Skin cancer     Review of Systems  Constitutional:  Negative for chills and fever.  HENT:  Negative for congestion and ear pain.   Eyes:  Positive for discharge and redness. Negative for blurred vision and double vision.  Respiratory:  Negative for shortness of breath.   Cardiovascular:  Positive for chest pain.  Skin:  Negative for itching and rash.  Neurological:  Negative for dizziness.   Negative unless indicated in HPI    Objective:    BP (!) 179/81   Pulse (!) 49   Temp (!) 97.5 F (36.4 C) (Temporal)   Ht 6\' 1"  (1.854 m)   Wt 239 lb (108.4 kg)   SpO2 99%   BMI 31.53 kg/m  BP Readings from Last 3 Encounters:  09/03/22 (!) 179/81  05/28/22 (!) 142/60  05/10/22 112/64   Wt Readings from Last 3 Encounters:  09/03/22 239 lb (108.4 kg)  05/28/22 248 lb 9.6 oz (112.8 kg)  05/10/22 250 lb 3.2 oz (113.5 kg)      Physical Exam Constitutional:      Appearance: Normal appearance.  HENT:     Head: Normocephalic and atraumatic.  Eyes:     General: Lids are normal. Lids are everted, no foreign bodies appreciated. Vision grossly intact. Gaze aligned appropriately. No allergic shiner, visual field deficit or scleral icterus.       Right eye: No discharge.        Left eye: Discharge present.    Extraocular Movements: Extraocular movements  intact.     Conjunctiva/sclera: Conjunctivae normal.     Right eye: No hemorrhage.    Left eye: Exudate present. No hemorrhage. Cardiovascular:     Rate and Rhythm: Normal rate and regular rhythm.  Pulmonary:     Effort: Pulmonary effort is normal.     Breath sounds: Normal breath sounds.  Skin:    General: Skin is warm and dry.     Coloration: Skin is not jaundiced.     Findings: No rash.  Neurological:     Mental Status: He is alert and oriented to person, place, and time. Mental status is at baseline.  Psychiatric:        Mood and Affect: Mood normal.        Behavior: Behavior normal.         Thought Content: Thought content normal.     No results found for any visits on 09/03/22.      Assessment & Plan:  Acute bacterial conjunctivitis of left eye -     Bacitracin-Polymyxin B; Place 1 Application into the left eye 2 (two) times daily. apply to eye every 12 hours while awake  Dispense: 3.5 g; Refill: 0  [name] was seen for conjunctivitis, no acute distress  -Hygiene: Wash your hands frequently with soap and water, especially before and after touching your eyes.  -Warm Compresses: Applying a warm compress to your eye several times a day can help soothe irritation.  - Avoid Rubbing: Try not to rub your eyes, as this can aggravate the condition and spread it to the other eye.  Return if symptoms worsen or fail to improve.  Arrie Aran Santa Lighter, DNP [facility] [address] [place], [place] [address] [phone]

## 2022-10-01 ENCOUNTER — Encounter: Payer: Self-pay | Admitting: Family Medicine

## 2022-10-03 ENCOUNTER — Ambulatory Visit (INDEPENDENT_AMBULATORY_CARE_PROVIDER_SITE_OTHER): Payer: Medicare Other | Admitting: Family Medicine

## 2022-10-03 ENCOUNTER — Encounter: Payer: Self-pay | Admitting: Family Medicine

## 2022-10-03 VITALS — BP 134/76 | HR 47 | Temp 97.2°F | Ht 73.0 in | Wt 235.8 lb

## 2022-10-03 DIAGNOSIS — I10 Essential (primary) hypertension: Secondary | ICD-10-CM

## 2022-10-03 DIAGNOSIS — I4821 Permanent atrial fibrillation: Secondary | ICD-10-CM | POA: Diagnosis not present

## 2022-10-03 DIAGNOSIS — H1032 Unspecified acute conjunctivitis, left eye: Secondary | ICD-10-CM

## 2022-10-03 MED ORDER — MOXIFLOXACIN HCL 0.5 % OP SOLN: 1.0000 [drp] | Freq: Three times a day (TID) | OPHTHALMIC | 0 refills | Status: AC

## 2022-10-03 NOTE — Progress Notes (Signed)
Subjective:  Patient ID: [name], male    DOB: [birth year]  Age: 89 y.o. MRN: [id number]  CC: Eye Problem (Left eye)   HPI [name] presents for Onset over 1 month ago of redness, swelling around the left eye. Given scrip for eye drops with no relief. Back today due to continued redness. Denies vision changes and eye pain. Some discharge still.      10/03/2022   11:32 AM 09/03/2022    8:28 AM 05/28/2022    1:04 PM  Depression screen PHQ 2/9  Decreased Interest 0 0 0  Down, Depressed, Hopeless 0 0 0  PHQ - 2 Score 0 0 0  Altered sleeping  0   Tired, decreased energy  0   Change in appetite  0   Feeling bad or failure about yourself   0   Trouble concentrating  0   Moving slowly or fidgety/restless  0   Suicidal thoughts  0   PHQ-9 Score  0   Difficult doing work/chores  Not difficult at all     History [name] has a past medical history of Arthritis, Atrial fibrillation (HCC) (04/28/2020), Hyperlipidemia, Hypertension, and Skin cancer.   He has a past surgical history that includes Joint replacement (Left); Appendectomy; Skin lesion excision; and Total hip arthroplasty (Right, 05/11/2020).   His family history includes Cancer in his mother; Dementia in his brother; Healthy in his daughter, son, and son; Stroke in his father.He reports that he has never smoked. He has never been exposed to tobacco smoke. He has never used smokeless tobacco. He reports that he does not drink alcohol and does not use drugs.    ROS Review of Systems  Objective:  BP 134/76   Pulse (!) 47   Temp (!) 97.2 F (36.2 C)   Ht 6\' 1"  (1.854 m)   Wt 235 lb 12.8 oz (107 kg)   SpO2 97%   BMI 31.11 kg/m   BP Readings from Last 3 Encounters:  10/03/22 134/76  09/03/22 (!) 179/81  05/28/22 (!) 142/60    Wt Readings from Last 3 Encounters:  10/03/22 235 lb 12.8 oz (107 kg)  09/03/22 239 lb (108.4 kg)  05/28/22 248 lb 9.6 oz (112.8 kg)     Physical Exam Eyes:     Extraocular  Movements:     Right eye: Normal extraocular motion.     Left eye: Normal extraocular motion.     Conjunctiva/sclera:     Right eye: Right conjunctiva is not injected. No chemosis, exudate or hemorrhage.    Left eye: Left conjunctiva is not injected. Chemosis and exudate present. No hemorrhage. Cardiovascular:     Rate and Rhythm: Normal rate. Rhythm irregular.     Heart sounds: Murmur heard.       Assessment & Plan:   [name] was seen today for eye problem.  Diagnoses and all orders for this visit:  Acute bacterial conjunctivitis of left eye -     CMP14+EGFR -     CBC with Differential/Platelet  Essential hypertension -     CMP14+EGFR -     CBC with Differential/Platelet  Permanent atrial fibrillation (HCC) -     CMP14+EGFR -     CBC with Differential/Platelet  Other orders -     moxifloxacin (VIGAMOX) 0.5 % ophthalmic solution; Place 1 drop into the left eye 3 (three) times daily for 7 days.       I have discontinued [name]'s bacitracin-polymyxin b. I am also  having him start on moxifloxacin. Additionally, I am having him maintain his fish oil-omega-3 fatty acids, polyethylene glycol, apixaban, triamcinolone ointment, omeprazole, amLODipine, hydrocortisone, metoprolol succinate, doxycycline, Medical Compression Stockings, furosemide, lisinopril, rosuvastatin, cloNIDine, and Vitamin D (Ergocalciferol).  Allergies as of 10/03/2022   No Known Allergies      Medication List        Accurate as of October 03, 2022  5:43 PM. If you have any questions, ask your nurse or doctor.          STOP taking these medications    bacitracin-polymyxin b ophthalmic ointment Commonly known as: POLYSPORIN Stopped by: [name]       TAKE these medications    amLODipine 10 MG tablet Commonly known as: NORVASC Take 1 tablet (10 mg total) by mouth daily.   apixaban 5 MG Tabs tablet Commonly known as: ELIQUIS Take 1 tablet (5 mg total) by mouth 2 (two) times  daily.   cloNIDine 0.1 mg/24hr patch Commonly known as: CATAPRES - Dosed in mg/24 hr Place 1 patch (0.1 mg total) onto the skin once a week. For BP   doxycycline 100 MG tablet Commonly known as: VIBRA-TABS Take 1 tablet (100 mg total) by mouth 2 (two) times daily.   fish oil-omega-3 fatty acids 1000 MG capsule Take 2 g by mouth daily.   furosemide 40 MG tablet Commonly known as: LASIX Take 1 tablet (40 mg total) by mouth every morning.   hydrocortisone 1 % lotion Apply 1 Application topically 2 (two) times daily.   lisinopril 40 MG tablet Commonly known as: ZESTRIL Take 1 tablet (40 mg total) by mouth daily.   Medical Compression Stockings Misc 1 each by Does not apply route daily.   metoprolol succinate 25 MG 24 hr tablet Commonly known as: TOPROL-XL Take 1 tablet (25 mg total) by mouth daily.   moxifloxacin 0.5 % ophthalmic solution Commonly known as: VIGAMOX Place 1 drop into the left eye 3 (three) times daily for 7 days. Started by: [name]   omeprazole 20 MG capsule Commonly known as: PRILOSEC Take 1 capsule (20 mg total) by mouth daily.   polyethylene glycol 17 g packet Commonly known as: MIRALAX / GLYCOLAX Take 17 g by mouth daily as needed for mild constipation.   rosuvastatin 5 MG tablet Commonly known as: CRESTOR Take 0.5 tablets (2.5 mg total) by mouth 3 (three) times a week. Monday, Wednesday, Friday at 6 pm   triamcinolone ointment 0.1 % Commonly known as: KENALOG Apply 1 application topically 2 (two) times daily.   Vitamin D (Ergocalciferol) 1.25 MG (50000 UNIT) Caps capsule Commonly known as: DRISDOL TAKE ONE CAPSULE BY MOUTH EVERY 7 DAYS         Follow-up: Return if symptoms worsen or fail to improve.  Mechele Claude, M.D.

## 2022-10-09 ENCOUNTER — Other Ambulatory Visit: Payer: Medicare Other

## 2022-10-09 DIAGNOSIS — I4821 Permanent atrial fibrillation: Secondary | ICD-10-CM | POA: Diagnosis not present

## 2022-10-09 DIAGNOSIS — H1032 Unspecified acute conjunctivitis, left eye: Secondary | ICD-10-CM | POA: Diagnosis not present

## 2022-10-09 DIAGNOSIS — I10 Essential (primary) hypertension: Secondary | ICD-10-CM | POA: Diagnosis not present

## 2022-10-10 LAB — CBC WITH DIFFERENTIAL/PLATELET
Basophils Absolute: 0 10*3/uL (ref 0.0–0.2)
Basos: 1 %
EOS (ABSOLUTE): 0.1 10*3/uL (ref 0.0–0.4)
Eos: 1 %
Hematocrit: 45.5 % (ref 37.5–51.0)
Hemoglobin: 15.3 g/dL (ref 13.0–17.7)
Immature Grans (Abs): 0 10*3/uL (ref 0.0–0.1)
Immature Granulocytes: 0 %
Lymphocytes Absolute: 2.3 10*3/uL (ref 0.7–3.1)
Lymphs: 36 %
MCH: 31.7 pg (ref 26.6–33.0)
MCHC: 33.6 g/dL (ref 31.5–35.7)
MCV: 94 fL (ref 79–97)
Monocytes Absolute: 0.9 10*3/uL (ref 0.1–0.9)
Monocytes: 14 %
Neutrophils Absolute: 3.1 10*3/uL (ref 1.4–7.0)
Neutrophils: 48 %
Platelets: 209 10*3/uL (ref 150–450)
RBC: 4.83 x10E6/uL (ref 4.14–5.80)
RDW: 12.2 % (ref 11.6–15.4)
WBC: 6.4 10*3/uL (ref 3.4–10.8)

## 2022-10-10 LAB — CMP14+EGFR
ALT: 9 IU/L (ref 0–44)
AST: 18 IU/L (ref 0–40)
Albumin: 3.9 g/dL (ref 3.6–4.6)
Alkaline Phosphatase: 73 IU/L (ref 44–121)
BUN/Creatinine Ratio: 18 (ref 10–24)
BUN: 17 mg/dL (ref 10–36)
Bilirubin Total: 0.7 mg/dL (ref 0.0–1.2)
CO2: 27 mmol/L (ref 20–29)
Calcium: 9.3 mg/dL (ref 8.6–10.2)
Chloride: 98 mmol/L (ref 96–106)
Creatinine, Ser: 0.92 mg/dL (ref 0.76–1.27)
Globulin, Total: 2.2 g/dL (ref 1.5–4.5)
Glucose: 80 mg/dL (ref 70–99)
Potassium: 4.5 mmol/L (ref 3.5–5.2)
Sodium: 138 mmol/L (ref 134–144)
Total Protein: 6.1 g/dL (ref 6.0–8.5)
eGFR: 79 mL/min/{1.73_m2} (ref >59)

## 2022-10-10 NOTE — Progress Notes (Signed)
Hello Yobani, ° °Your lab result is normal and/or stable.Some minor variations that are not significant are commonly marked abnormal, but do not represent any medical problem for you. ° °Best regards, °Warren Stacks, M.D.

## 2022-12-06 ENCOUNTER — Emergency Department (HOSPITAL_COMMUNITY)
Admission: EM | Admit: 2022-12-06 | Discharge: 2022-12-06 | Disposition: A | Discharge status: Home / Self Care | Payer: No Typology Code available for payment source | Attending: Emergency Medicine | Admitting: Emergency Medicine

## 2022-12-06 ENCOUNTER — Other Ambulatory Visit: Payer: Self-pay

## 2022-12-06 ENCOUNTER — Emergency Department (HOSPITAL_COMMUNITY): Payer: No Typology Code available for payment source

## 2022-12-06 ENCOUNTER — Ambulatory Visit: Payer: Medicare Other | Admitting: Family Medicine

## 2022-12-06 DIAGNOSIS — W19XXXA Unspecified fall, initial encounter: Secondary | ICD-10-CM

## 2022-12-06 DIAGNOSIS — S0083XA Contusion of other part of head, initial encounter: Secondary | ICD-10-CM | POA: Diagnosis not present

## 2022-12-06 DIAGNOSIS — S0990XA Unspecified injury of head, initial encounter: Secondary | ICD-10-CM | POA: Diagnosis not present

## 2022-12-06 DIAGNOSIS — Y9301 Activity, walking, marching and hiking: Secondary | ICD-10-CM | POA: Diagnosis not present

## 2022-12-06 DIAGNOSIS — S199XXA Unspecified injury of neck, initial encounter: Secondary | ICD-10-CM | POA: Diagnosis not present

## 2022-12-06 DIAGNOSIS — H5789 Other specified disorders of eye and adnexa: Secondary | ICD-10-CM | POA: Diagnosis not present

## 2022-12-06 DIAGNOSIS — Z7901 Long term (current) use of anticoagulants: Secondary | ICD-10-CM | POA: Insufficient documentation

## 2022-12-06 DIAGNOSIS — W010XXA Fall on same level from slipping, tripping and stumbling without subsequent striking against object, initial encounter: Secondary | ICD-10-CM | POA: Insufficient documentation

## 2022-12-06 DIAGNOSIS — R58 Hemorrhage, not elsewhere classified: Secondary | ICD-10-CM

## 2022-12-06 DIAGNOSIS — I4891 Unspecified atrial fibrillation: Secondary | ICD-10-CM | POA: Insufficient documentation

## 2022-12-06 NOTE — Discharge Instructions (Addendum)
Please call and make an appointment with opthamology for follow up in 3-5 days

## 2022-12-06 NOTE — ED Provider Notes (Addendum)
[place] EMERGENCY DEPARTMENT AT [facility] Provider Note   CSN: [id number] Arrival date & time: 12/06/22  1128     History  Chief Complaint  Patient presents with   [name]    [name] is a 89 y.o. male.  Patient is a 89 year old male with past medical history of A-fib on Eliquis presenting for mechanical fall forward with head laceration.  Patient states he was walking outside when he tripped over a curb and fell forward on concrete. Hematoma near supraorbital ridge present. Admits to bleeding.  He denies LOC.  Denies any neck or back pain. Denies headache, nausea, or vomiting. Denies vision loss or vision changes. Denies ocular pain.  Denies any other bony tenderness.  Daughter at bedside daughter at bedside witnessed fall.  No LOC.  The history is provided by the patient. No language interpreter was used.  Fall Pertinent negatives include no chest pain, no abdominal pain and no shortness of breath.       Home Medications Prior to Admission medications   Medication Sig Start Date End Date Taking? Authorizing Provider  amLODipine (NORVASC) 10 MG tablet Take 1 tablet (10 mg total) by mouth daily. 07/20/21   Gwenlyn Fudge, FNP  apixaban (ELIQUIS) 5 MG TABS tablet Take 1 tablet (5 mg total) by mouth 2 (two) times daily. 10/19/20   Gwenlyn Fudge, FNP  cloNIDine (CATAPRES - DOSED IN MG/24 HR) 0.1 mg/24hr patch Place 1 patch (0.1 mg total) onto the skin once a week. For BP 07/17/22   Mechele Claude, MD  doxycycline (VIBRA-TABS) 100 MG tablet Take 1 tablet (100 mg total) by mouth 2 (two) times daily. 03/30/22   Junie Spencer, FNP  Elastic Bandages & Supports (MEDICAL COMPRESSION STOCKINGS) MISC 1 each by Does not apply route daily. 04/27/22   Daphine Deutscher Mary-Margaret, FNP  fish oil-omega-3 fatty acids 1000 MG capsule Take 2 g by mouth daily.    [provider]  furosemide (LASIX) 40 MG tablet Take 1 tablet (40 mg total) by mouth every morning. 05/10/22   Mechele Claude, MD  hydrocortisone 1 % lotion Apply 1 Application topically 2 (two) times daily. 08/25/21   Daryll Drown, NP  lisinopril (ZESTRIL) 40 MG tablet Take 1 tablet (40 mg total) by mouth daily. 07/03/22   Mechele Claude, MD  metoprolol succinate (TOPROL-XL) 25 MG 24 hr tablet Take 1 tablet (25 mg total) by mouth daily. 12/04/21   Mechele Claude, MD  omeprazole (PRILOSEC) 20 MG capsule Take 1 capsule (20 mg total) by mouth daily. 03/24/21   Jannifer Rodney A, FNP  polyethylene glycol (MIRALAX / GLYCOLAX) 17 g packet Take 17 g by mouth daily as needed for mild constipation. 05/17/20   Edmisten, Kristie L, PA  rosuvastatin (CRESTOR) 5 MG tablet Take 0.5 tablets (2.5 mg total) by mouth 3 (three) times a week. Monday, Wednesday, Friday at 6 pm 07/04/22   Mechele Claude, MD  triamcinolone ointment (KENALOG) 0.1 % Apply 1 application topically 2 (two) times daily. 10/19/20   Gwenlyn Fudge, FNP  Vitamin D, Ergocalciferol, (DRISDOL) 1.25 MG (50000 UNIT) CAPS capsule TAKE ONE CAPSULE BY MOUTH EVERY 7 DAYS 08/13/22   Mechele Claude, MD      Allergies    Patient has no known allergies.    Review of Systems   Review of Systems  Constitutional:  Negative for chills and fever.  HENT:  Negative for ear pain and sore throat.   Eyes:  Negative for pain  and visual disturbance.  Respiratory:  Negative for cough and shortness of breath.   Cardiovascular:  Negative for chest pain and palpitations.  Gastrointestinal:  Negative for abdominal pain and vomiting.  Genitourinary:  Negative for dysuria and hematuria.  Musculoskeletal:  Negative for arthralgias and back pain.  Skin:  Positive for wound. Negative for color change and rash.  Neurological:  Negative for seizures and syncope.  All other systems reviewed and are negative.   Physical Exam Updated Vital Signs BP (!) 173/96 (BP Location: Right Arm)   Pulse (!) 51   Temp 97.6 F (36.4 C) (Oral)   Resp 18   Ht 6\' 1"  (1.854 m)   Wt 99.8 kg   SpO2 96%    BMI 29.03 kg/m  Physical Exam Vitals and nursing note reviewed.  Constitutional:      General: He is not in acute distress.    Appearance: He is well-developed.  HENT:     Head: Normocephalic.      Comments: No facial bony tenderness. Eyes:     General: Lids are normal. Vision grossly intact.     Extraocular Movements: Extraocular movements intact.     Conjunctiva/sclera: Conjunctivae normal.     Pupils: Pupils are equal, round, and reactive to light.  Cardiovascular:     Rate and Rhythm: Normal rate and regular rhythm.     Heart sounds: No murmur heard. Pulmonary:     Effort: Pulmonary effort is normal. No respiratory distress.     Breath sounds: Normal breath sounds.  Abdominal:     Palpations: Abdomen is soft.     Tenderness: There is no abdominal tenderness.  Musculoskeletal:        General: No swelling.     Cervical back: Neck supple. No pain with movement.     Thoracic back: No bony tenderness.     Lumbar back: No bony tenderness.  Skin:    General: Skin is warm and dry.     Capillary Refill: Capillary refill takes less than 2 seconds.  Neurological:     General: No focal deficit present.     Mental Status: He is alert and oriented to person, place, and time.     GCS: GCS eye subscore is 4. GCS verbal subscore is 5. GCS motor subscore is 6.     Cranial Nerves: Cranial nerves 2-12 are intact.     Sensory: Sensation is intact.     Motor: Motor function is intact.     Coordination: Coordination is intact.  Psychiatric:        Mood and Affect: Mood normal.    ED Results / Procedures / Treatments   Labs (all labs ordered are listed, but only abnormal results are displayed) Labs Reviewed - No data to display  EKG None  Radiology CT Head Wo Contrast  Result Date: 12/06/2022 CLINICAL DATA:  Head trauma, minor (Age >= 65y); Neck trauma (Age >= 65y); Facial trauma, blunt. Fall with head strike. On Eliquis. EXAM: CT HEAD WITHOUT CONTRAST CT MAXILLOFACIAL WITHOUT  CONTRAST CT CERVICAL SPINE WITHOUT CONTRAST TECHNIQUE: Multidetector CT imaging of the head, cervical spine, and maxillofacial structures were performed using the standard protocol without intravenous contrast. Multiplanar CT image reconstructions of the cervical spine and maxillofacial structures were also generated. RADIATION DOSE REDUCTION: This exam was performed according to the departmental dose-optimization program which includes automated exposure control, adjustment of the mA and/or kV according to patient size and/or use of iterative reconstruction technique. COMPARISON:  None Available.  FINDINGS: CT HEAD FINDINGS Brain: No acute hemorrhage. Cortical Loretta Doutt-white differentiation is preserved. Patchy hypoattenuation of the cerebral white matter, most consistent with mild chronic small-vessel disease. Prominence of the ventricles and sulci within normal limits for age. No extra-axial collection. Basilar cisterns are patent. Vascular: No hyperdense vessel or unexpected calcification. Skull: No calvarial fracture or suspicious bone lesion. Skull base is unremarkable. Other: None. CT MAXILLOFACIAL FINDINGS Osseous: No fracture or mandibular dislocation. No destructive process. Orbits: Negative. No traumatic or inflammatory finding. Sinuses: Clear. Soft tissues: Large left supraorbital hematoma. CT CERVICAL SPINE FINDINGS Alignment: 3 mm degenerative anterolisthesis of C3 on C4 and C7 on T1. No traumatic malalignment. Skull base and vertebrae: Moderate degenerative changes at C1-2 in the craniocervical junction. No acute fracture of the cervical spine. Multilevel degenerative endplate changes. Soft tissues and spinal canal: No prevertebral fluid or swelling. No visible canal hematoma. Disc levels: Multilevel cervical spondylosis without high-grade spinal canal stenosis. Upper chest: No acute findings. Other: None. IMPRESSION: 1. No acute intracranial abnormality. 2. No acute facial bone fracture. Large left  supraorbital hematoma. 3. No acute fracture or traumatic malalignment of the cervical spine. Electronically Signed   By: Orvan Falconer M.D.   On: 12/06/2022 14:58   CT Maxillofacial Wo Contrast  Result Date: 12/06/2022 CLINICAL DATA:  Head trauma, minor (Age >= 65y); Neck trauma (Age >= 65y); Facial trauma, blunt. Fall with head strike. On Eliquis. EXAM: CT HEAD WITHOUT CONTRAST CT MAXILLOFACIAL WITHOUT CONTRAST CT CERVICAL SPINE WITHOUT CONTRAST TECHNIQUE: Multidetector CT imaging of the head, cervical spine, and maxillofacial structures were performed using the standard protocol without intravenous contrast. Multiplanar CT image reconstructions of the cervical spine and maxillofacial structures were also generated. RADIATION DOSE REDUCTION: This exam was performed according to the departmental dose-optimization program which includes automated exposure control, adjustment of the mA and/or kV according to patient size and/or use of iterative reconstruction technique. COMPARISON:  None Available. FINDINGS: CT HEAD FINDINGS Brain: No acute hemorrhage. Cortical Sharin Altidor-white differentiation is preserved. Patchy hypoattenuation of the cerebral white matter, most consistent with mild chronic small-vessel disease. Prominence of the ventricles and sulci within normal limits for age. No extra-axial collection. Basilar cisterns are patent. Vascular: No hyperdense vessel or unexpected calcification. Skull: No calvarial fracture or suspicious bone lesion. Skull base is unremarkable. Other: None. CT MAXILLOFACIAL FINDINGS Osseous: No fracture or mandibular dislocation. No destructive process. Orbits: Negative. No traumatic or inflammatory finding. Sinuses: Clear. Soft tissues: Large left supraorbital hematoma. CT CERVICAL SPINE FINDINGS Alignment: 3 mm degenerative anterolisthesis of C3 on C4 and C7 on T1. No traumatic malalignment. Skull base and vertebrae: Moderate degenerative changes at C1-2 in the craniocervical  junction. No acute fracture of the cervical spine. Multilevel degenerative endplate changes. Soft tissues and spinal canal: No prevertebral fluid or swelling. No visible canal hematoma. Disc levels: Multilevel cervical spondylosis without high-grade spinal canal stenosis. Upper chest: No acute findings. Other: None. IMPRESSION: 1. No acute intracranial abnormality. 2. No acute facial bone fracture. Large left supraorbital hematoma. 3. No acute fracture or traumatic malalignment of the cervical spine. Electronically Signed   By: Orvan Falconer M.D.   On: 12/06/2022 14:58   CT Cervical Spine Wo Contrast  Result Date: 12/06/2022 CLINICAL DATA:  Head trauma, minor (Age >= 65y); Neck trauma (Age >= 65y); Facial trauma, blunt. Fall with head strike. On Eliquis. EXAM: CT HEAD WITHOUT CONTRAST CT MAXILLOFACIAL WITHOUT CONTRAST CT CERVICAL SPINE WITHOUT CONTRAST TECHNIQUE: Multidetector CT imaging of the head, cervical spine, and maxillofacial structures  were performed using the standard protocol without intravenous contrast. Multiplanar CT image reconstructions of the cervical spine and maxillofacial structures were also generated. RADIATION DOSE REDUCTION: This exam was performed according to the departmental dose-optimization program which includes automated exposure control, adjustment of the mA and/or kV according to patient size and/or use of iterative reconstruction technique. COMPARISON:  None Available. FINDINGS: CT HEAD FINDINGS Brain: No acute hemorrhage. Cortical Tracen Mahler-white differentiation is preserved. Patchy hypoattenuation of the cerebral white matter, most consistent with mild chronic small-vessel disease. Prominence of the ventricles and sulci within normal limits for age. No extra-axial collection. Basilar cisterns are patent. Vascular: No hyperdense vessel or unexpected calcification. Skull: No calvarial fracture or suspicious bone lesion. Skull base is unremarkable. Other: None. CT MAXILLOFACIAL  FINDINGS Osseous: No fracture or mandibular dislocation. No destructive process. Orbits: Negative. No traumatic or inflammatory finding. Sinuses: Clear. Soft tissues: Large left supraorbital hematoma. CT CERVICAL SPINE FINDINGS Alignment: 3 mm degenerative anterolisthesis of C3 on C4 and C7 on T1. No traumatic malalignment. Skull base and vertebrae: Moderate degenerative changes at C1-2 in the craniocervical junction. No acute fracture of the cervical spine. Multilevel degenerative endplate changes. Soft tissues and spinal canal: No prevertebral fluid or swelling. No visible canal hematoma. Disc levels: Multilevel cervical spondylosis without high-grade spinal canal stenosis. Upper chest: No acute findings. Other: None. IMPRESSION: 1. No acute intracranial abnormality. 2. No acute facial bone fracture. Large left supraorbital hematoma. 3. No acute fracture or traumatic malalignment of the cervical spine. Electronically Signed   By: Orvan Falconer M.D.   On: 12/06/2022 14:58    Procedures .Critical Care  Performed by: Franne Forts, DO Authorized by: Franne Forts, DO   Critical care provider statement:    Critical care time (minutes):  30   Critical care was necessary to treat or prevent imminent or life-threatening deterioration of the following conditions:  Trauma   Critical care was time spent personally by me on the following activities:  Development of treatment plan with patient or surrogate, discussions with consultants, evaluation of patient's response to treatment, examination of patient, ordering and review of laboratory studies, ordering and review of radiographic studies, ordering and performing treatments and interventions, pulse oximetry, re-evaluation of patient's condition and review of old charts     Medications Ordered in ED Medications - No data to display  ED Course/ Medical Decision Making/ A&P                                 Medical Decision Making Amount and/or  Complexity of Data Reviewed Radiology: ordered.   13:37 PM 89 year old male with past medical history of A-fib on Eliquis presenting for mechanical fall forward with head laceration.  Patient is alert and oriented x 3, no acute distress, afebrile, stable vital signs.  Hematoma to the left supraorbital ridge with bleeding controlled at this time.  No neurovascular deficits.  Pressure dressing applied.  CT head cervical spine, and maxillary facial pending.  CT head negative.  CT neck negative.  Maxillary facial demonstrates supraorbital hematoma.  Clear orbits.  No facial fractures.  Patient waited prior to discharge and continues to have extraocular eye motions intact with no vision changes.  Son at bedside given close return precautions for development of ocular pain or vision changes.  Otherwise recommended for follow-up with ophthalmology in the next 3 to 5 days for significant lid edema and periorbital swelling from hematoma.  Patient in no distress and overall condition improved here in the ED. Detailed discussions were had with the patient regarding current findings, and need for close f/u with PCP or on call doctor. The patient has been instructed to return immediately if the symptoms worsen in any way for re-evaluation. Patient verbalized understanding and is in agreement with current care plan. All questions answered prior to discharge.          Final Clinical Impression(s) / ED Diagnoses Final diagnoses:  Fall, initial encounter  Contusion of face, initial encounter  Periorbital swelling  Ecchymosis    Rx / DC Orders ED Discharge Orders     None         Franne Forts, DO 12/07/22 2235    Franne Forts, DO 12/07/22 2257

## 2022-12-06 NOTE — ED Triage Notes (Signed)
Pt had a fall this morning, hit head on concrete, L eyebrow laceration as well as what appears to be a hematoma forming. bleeding controlled at this time, pt is on blood thinners, eliquis

## 2022-12-07 DIAGNOSIS — S0512XA Contusion of eyeball and orbital tissues, left eye, initial encounter: Secondary | ICD-10-CM | POA: Diagnosis not present

## 2022-12-07 DIAGNOSIS — H2523 Age-related cataract, morgagnian type, bilateral: Secondary | ICD-10-CM | POA: Diagnosis not present

## 2022-12-07 DIAGNOSIS — S0012XA Contusion of left eyelid and periocular area, initial encounter: Secondary | ICD-10-CM | POA: Diagnosis not present

## 2022-12-07 DIAGNOSIS — S0011XA Contusion of right eyelid and periocular area, initial encounter: Secondary | ICD-10-CM | POA: Diagnosis not present

## 2022-12-07 DIAGNOSIS — H35361 Drusen (degenerative) of macula, right eye: Secondary | ICD-10-CM | POA: Diagnosis not present

## 2022-12-07 DIAGNOSIS — S0511XA Contusion of eyeball and orbital tissues, right eye, initial encounter: Secondary | ICD-10-CM | POA: Diagnosis not present

## 2022-12-13 ENCOUNTER — Ambulatory Visit: Payer: Medicare Other | Admitting: Family Medicine

## 2023-01-04 ENCOUNTER — Other Ambulatory Visit: Payer: Self-pay | Admitting: Family Medicine

## 2023-01-04 DIAGNOSIS — E782 Mixed hyperlipidemia: Secondary | ICD-10-CM

## 2023-01-11 ENCOUNTER — Other Ambulatory Visit: Payer: Self-pay | Admitting: Family Medicine

## 2023-02-07 ENCOUNTER — Other Ambulatory Visit: Payer: Self-pay | Admitting: Family Medicine

## 2023-02-07 DIAGNOSIS — L03116 Cellulitis of left lower limb: Secondary | ICD-10-CM

## 2023-02-07 DIAGNOSIS — I10 Essential (primary) hypertension: Secondary | ICD-10-CM

## 2023-02-07 DIAGNOSIS — I878 Other specified disorders of veins: Secondary | ICD-10-CM

## 2023-02-09 ENCOUNTER — Other Ambulatory Visit: Payer: Self-pay | Admitting: Family Medicine

## 2023-02-11 ENCOUNTER — Other Ambulatory Visit: Payer: Self-pay | Admitting: Family Medicine

## 2023-02-11 NOTE — Telephone Encounter (Signed)
 Stacks pt NTBS 30-d given 01/07/23

## 2023-02-11 NOTE — Telephone Encounter (Signed)
 Per patient's daughter he is with this new program where his doctor's comes to him. Patient don't have driver's license anymore and [facility]. [facility] and if he can't get his refills through the [facility] she will reach back out to us .

## 2023-02-15 DIAGNOSIS — H04122 Dry eye syndrome of left lacrimal gland: Secondary | ICD-10-CM | POA: Diagnosis not present

## 2023-02-15 DIAGNOSIS — H35363 Drusen (degenerative) of macula, bilateral: Secondary | ICD-10-CM | POA: Diagnosis not present

## 2023-02-15 DIAGNOSIS — H35372 Puckering of macula, left eye: Secondary | ICD-10-CM | POA: Diagnosis not present

## 2023-02-15 DIAGNOSIS — H02135 Senile ectropion of left lower eyelid: Secondary | ICD-10-CM | POA: Diagnosis not present

## 2023-02-15 DIAGNOSIS — H2513 Age-related nuclear cataract, bilateral: Secondary | ICD-10-CM | POA: Diagnosis not present

## 2023-02-19 ENCOUNTER — Other Ambulatory Visit: Payer: Self-pay | Admitting: Family Medicine

## 2023-02-19 NOTE — Telephone Encounter (Signed)
 I spoke with his daughter & she said that all his meds are going to [facility] now, so no longer needs meds from here.

## 2023-02-19 NOTE — Telephone Encounter (Signed)
 Stacks pt NTBS 30-d given 01/07/23

## 2023-05-08 DIAGNOSIS — H04123 Dry eye syndrome of bilateral lacrimal glands: Secondary | ICD-10-CM | POA: Diagnosis not present

## 2023-05-08 DIAGNOSIS — H02135 Senile ectropion of left lower eyelid: Secondary | ICD-10-CM | POA: Diagnosis not present

## 2023-05-08 DIAGNOSIS — H02145 Spastic ectropion of left lower eyelid: Secondary | ICD-10-CM | POA: Diagnosis not present

## 2023-05-08 DIAGNOSIS — H0279 Other degenerative disorders of eyelid and periocular area: Secondary | ICD-10-CM | POA: Diagnosis not present

## 2023-05-08 DIAGNOSIS — H04523 Eversion of bilateral lacrimal punctum: Secondary | ICD-10-CM | POA: Diagnosis not present

## 2023-05-08 DIAGNOSIS — H04522 Eversion of left lacrimal punctum: Secondary | ICD-10-CM | POA: Diagnosis not present

## 2023-05-08 DIAGNOSIS — H02115 Cicatricial ectropion of left lower eyelid: Secondary | ICD-10-CM | POA: Diagnosis not present

## 2023-05-08 DIAGNOSIS — H02535 Eyelid retraction left lower eyelid: Secondary | ICD-10-CM | POA: Diagnosis not present

## 2023-05-08 DIAGNOSIS — Z01818 Encounter for other preprocedural examination: Secondary | ICD-10-CM | POA: Diagnosis not present

## 2023-05-08 DIAGNOSIS — H16212 Exposure keratoconjunctivitis, left eye: Secondary | ICD-10-CM | POA: Diagnosis not present

## 2023-08-28 ENCOUNTER — Telehealth: Payer: Self-pay

## 2023-08-28 NOTE — Telephone Encounter (Signed)
   Pre-operative Risk Assessment    Patient Name: [name]  DOB: 1929/10/23 MRN: [id number]   Date of last office visit: 05/04/20 Date of next office visit: n/a   Request for Surgical Clearance    Procedure:  Tentative (NS) left lower eyelid ectropion repair   Date of Surgery:  Clearance 09/30/23                                 Surgeon:  Dr. Renzo Zaldivar  Surgeon's Group or Practice Name:  [facility]  Phone number:  [phone] Fax number:  [phone]   Type of Clearance Requested:   - Medical  - Pharmacy:  Hold Apixaban  (Eliquis ) Not indicated    Type of Anesthesia:  MAC   Additional requests/questions:    [name]   08/28/2023, 11:40 AM

## 2023-08-28 NOTE — Telephone Encounter (Signed)
 I s/w DPR pt's daughter who tells me they are no longer seeing our practice and that her dad is being seen with the [facility].   I thanked her for her help and assured the daughter that I will update the surgeon office that they will need to reach out to the [facility] for the clearance. Pt's daughter thank me for the help.

## 2023-08-28 NOTE — Telephone Encounter (Signed)
 Primary Cardiologist:None  Chart reviewed as part of pre-operative protocol coverage. Because of Lenard Sitter's past medical history and time since last visit, he/she will require a follow-up visit in order to better assess preoperative cardiovascular risk.  Pre-op covering staff: - Please schedule appointment and call patient to inform them. - Please contact requesting surgeon's office via preferred method (i.e, phone, fax) to inform them of need for appointment prior to surgery.  Rosaline EMERSON Bane, NP-C  08/28/2023, 12:16 PM [address], [address] [place], [place] [address] Office [phone] Fax [phone]

## 2023-12-19 ENCOUNTER — Telehealth: Payer: Self-pay

## 2023-12-19 NOTE — Telephone Encounter (Signed)
 [name] called, states she stated previously, patient wont be coming back to the practice because he is in in home care

## 2023-12-19 NOTE — Telephone Encounter (Signed)
 Patient is overdue for an appointment. LMOM for patient to call and schedule.
